# Patient Record
Sex: Male | Born: 1965 | Race: Black or African American | Hispanic: No | Marital: Married | Smoking: Light tobacco smoker
Health system: Southern US, Community
[De-identification: ages and names within clinical notes are randomized; demographics above are authoritative.]

## PROBLEM LIST (undated history)

## (undated) DIAGNOSIS — I255 Ischemic cardiomyopathy: Secondary | ICD-10-CM

## (undated) DIAGNOSIS — I10 Essential (primary) hypertension: Secondary | ICD-10-CM

## (undated) DIAGNOSIS — I236 Thrombosis of atrium, auricular appendage, and ventricle as current complications following acute myocardial infarction: Secondary | ICD-10-CM

## (undated) DIAGNOSIS — R918 Other nonspecific abnormal finding of lung field: Secondary | ICD-10-CM

## (undated) DIAGNOSIS — I214 Non-ST elevation (NSTEMI) myocardial infarction: Secondary | ICD-10-CM

## (undated) DIAGNOSIS — E785 Hyperlipidemia, unspecified: Secondary | ICD-10-CM

## (undated) HISTORY — PX: HERNIA REPAIR: SHX51

---

## 2007-01-01 ENCOUNTER — Emergency Department (HOSPITAL_COMMUNITY): Admission: EM | Admit: 2007-01-01 | Discharge: 2007-01-01 | Payer: Self-pay | Admitting: Emergency Medicine

## 2014-03-26 ENCOUNTER — Encounter (HOSPITAL_COMMUNITY): Payer: Self-pay | Admitting: Emergency Medicine

## 2014-03-26 ENCOUNTER — Emergency Department (HOSPITAL_COMMUNITY): Payer: Medicaid Other

## 2014-03-26 ENCOUNTER — Inpatient Hospital Stay (HOSPITAL_COMMUNITY)
Admission: EM | Admit: 2014-03-26 | Discharge: 2014-04-02 | DRG: 247 | Disposition: A | Discharge status: Home / Self Care | Payer: Medicaid Other | Attending: Interventional Cardiology | Admitting: Interventional Cardiology

## 2014-03-26 DIAGNOSIS — R918 Other nonspecific abnormal finding of lung field: Secondary | ICD-10-CM | POA: Diagnosis present

## 2014-03-26 DIAGNOSIS — Z720 Tobacco use: Secondary | ICD-10-CM | POA: Diagnosis present

## 2014-03-26 DIAGNOSIS — I2109 ST elevation (STEMI) myocardial infarction involving other coronary artery of anterior wall: Secondary | ICD-10-CM | POA: Diagnosis present

## 2014-03-26 DIAGNOSIS — I2584 Coronary atherosclerosis due to calcified coronary lesion: Secondary | ICD-10-CM | POA: Diagnosis present

## 2014-03-26 DIAGNOSIS — R079 Chest pain, unspecified: Secondary | ICD-10-CM

## 2014-03-26 DIAGNOSIS — I2510 Atherosclerotic heart disease of native coronary artery without angina pectoris: Secondary | ICD-10-CM | POA: Diagnosis present

## 2014-03-26 DIAGNOSIS — I472 Ventricular tachycardia: Secondary | ICD-10-CM | POA: Diagnosis present

## 2014-03-26 DIAGNOSIS — I10 Essential (primary) hypertension: Secondary | ICD-10-CM | POA: Diagnosis present

## 2014-03-26 DIAGNOSIS — E785 Hyperlipidemia, unspecified: Secondary | ICD-10-CM | POA: Diagnosis present

## 2014-03-26 DIAGNOSIS — I2102 ST elevation (STEMI) myocardial infarction involving left anterior descending coronary artery: Principal | ICD-10-CM | POA: Diagnosis present

## 2014-03-26 DIAGNOSIS — R9389 Abnormal findings on diagnostic imaging of other specified body structures: Secondary | ICD-10-CM

## 2014-03-26 DIAGNOSIS — F17210 Nicotine dependence, cigarettes, uncomplicated: Secondary | ICD-10-CM | POA: Diagnosis present

## 2014-03-26 DIAGNOSIS — I255 Ischemic cardiomyopathy: Secondary | ICD-10-CM | POA: Diagnosis present

## 2014-03-26 DIAGNOSIS — I513 Intracardiac thrombosis, not elsewhere classified: Secondary | ICD-10-CM | POA: Diagnosis not present

## 2014-03-26 DIAGNOSIS — I214 Non-ST elevation (NSTEMI) myocardial infarction: Secondary | ICD-10-CM

## 2014-03-26 HISTORY — DX: Essential (primary) hypertension: I10

## 2014-03-26 HISTORY — DX: Thrombosis of atrium, auricular appendage, and ventricle as current complications following acute myocardial infarction: I23.6

## 2014-03-26 HISTORY — DX: Non-ST elevation (NSTEMI) myocardial infarction: I21.4

## 2014-03-26 HISTORY — DX: Other nonspecific abnormal finding of lung field: R91.8

## 2014-03-26 HISTORY — DX: Ischemic cardiomyopathy: I25.5

## 2014-03-26 HISTORY — DX: Hyperlipidemia, unspecified: E78.5

## 2014-03-26 LAB — BASIC METABOLIC PANEL
ANION GAP: 9 (ref 5–15)
BUN: 11 mg/dL (ref 6–23)
CALCIUM: 9.5 mg/dL (ref 8.4–10.5)
CO2: 26 mmol/L (ref 19–32)
Chloride: 102 mmol/L (ref 96–112)
Creatinine, Ser: 0.98 mg/dL (ref 0.50–1.35)
GFR calc Af Amer: >90 mL/min (ref >90)
Glucose, Bld: 83 mg/dL (ref 70–99)
POTASSIUM: 3.9 mmol/L (ref 3.5–5.1)
SODIUM: 137 mmol/L (ref 135–145)

## 2014-03-26 LAB — CBC
HEMATOCRIT: 41.1 % (ref 39.0–52.0)
Hemoglobin: 14.1 g/dL (ref 13.0–17.0)
MCH: 31.3 pg (ref 26.0–34.0)
MCHC: 34.3 g/dL (ref 30.0–36.0)
MCV: 91.3 fL (ref 78.0–100.0)
Platelets: 186 10*3/uL (ref 150–400)
RBC: 4.5 MIL/uL (ref 4.22–5.81)
RDW: 14.2 % (ref 11.5–15.5)
WBC: 5.5 10*3/uL (ref 4.0–10.5)

## 2014-03-26 LAB — TROPONIN I: Troponin I: 0.16 ng/mL — ABNORMAL HIGH (ref <0.031)

## 2014-03-26 LAB — I-STAT TROPONIN, ED: Troponin i, poc: 0.1 ng/mL (ref 0.00–0.08)

## 2014-03-26 LAB — BRAIN NATRIURETIC PEPTIDE: B NATRIURETIC PEPTIDE 5: 48.4 pg/mL (ref 0.0–100.0)

## 2014-03-26 MED ORDER — NITROGLYCERIN 2 % TD OINT
0.5000 [in_us] | TOPICAL_OINTMENT | Freq: Four times a day (QID) | TRANSDERMAL | Status: DC
  Administered 2014-03-27 (×2): 0.5 [in_us] via TOPICAL
  Filled 2014-03-26 (×2): qty 1

## 2014-03-26 MED ORDER — ONDANSETRON HCL 4 MG/2ML IJ SOLN: 4.0000 mg | Freq: Four times a day (QID) | INTRAMUSCULAR | Status: DC | PRN

## 2014-03-26 MED ORDER — PANTOPRAZOLE SODIUM 40 MG PO TBEC
40.0000 mg | DELAYED_RELEASE_TABLET | Freq: Every day | ORAL | Status: DC
  Administered 2014-03-27 – 2014-04-02 (×7): 40 mg via ORAL
  Filled 2014-03-26 (×7): qty 1

## 2014-03-26 MED ORDER — ACETAMINOPHEN 325 MG PO TABS: 650.0000 mg | ORAL_TABLET | ORAL | Status: DC | PRN

## 2014-03-26 MED ORDER — DIPHENHYDRAMINE HCL 50 MG/ML IJ SOLN
25.0000 mg | Freq: Once | INTRAMUSCULAR | Status: AC
  Administered 2014-03-26: 25 mg via INTRAVENOUS
  Filled 2014-03-26: qty 1

## 2014-03-26 MED ORDER — NITROGLYCERIN 0.4 MG SL SUBL: 0.4000 mg | SUBLINGUAL_TABLET | SUBLINGUAL | Status: DC | PRN

## 2014-03-26 MED ORDER — PANTOPRAZOLE SODIUM 40 MG IV SOLR
40.0000 mg | Freq: Once | INTRAVENOUS | Status: AC
  Administered 2014-03-26: 40 mg via INTRAVENOUS
  Filled 2014-03-26: qty 40

## 2014-03-26 MED ORDER — MORPHINE SULFATE 4 MG/ML IJ SOLN
4.0000 mg | Freq: Once | INTRAMUSCULAR | Status: AC
  Administered 2014-03-26: 4 mg via INTRAVENOUS
  Filled 2014-03-26: qty 1

## 2014-03-26 MED ORDER — GI COCKTAIL ~~LOC~~: 30.0000 mL | Freq: Four times a day (QID) | ORAL | Status: DC | PRN

## 2014-03-26 MED ORDER — HEPARIN SODIUM (PORCINE) 5000 UNIT/ML IJ SOLN
5000.0000 [IU] | Freq: Three times a day (TID) | INTRAMUSCULAR | Status: DC
  Administered 2014-03-27: 5000 [IU] via SUBCUTANEOUS
  Filled 2014-03-26: qty 1

## 2014-03-26 MED ORDER — ONDANSETRON HCL 4 MG/2ML IJ SOLN
4.0000 mg | Freq: Once | INTRAMUSCULAR | Status: AC
  Administered 2014-03-26: 4 mg via INTRAVENOUS
  Filled 2014-03-26: qty 2

## 2014-03-26 NOTE — ED Provider Notes (Signed)
CSN: [id number]     Arrival date & time 03/26/14  1939 History   First MD Initiated Contact with Patient 03/26/14 2007     Chief Complaint  Patient presents with  . Chest Pain     (Consider location/radiation/quality/duration/timing/severity/associated sxs/prior Treatment) HPI Comments: Patient is a 49 year old male with a past medical history of hypertension and tobacco use who presents after 2 episodes of chest pain that occurred prior to arrival. 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 The pain is described as "burning" and severe. Patient reports being "unable to catch his breath" at that time. He reports diaphoresis as well. The symptoms lasted about 10 minutes and spontaneously resolved. Patient reports coming home shortly after the first episode and had another similar episode when he was eating dinner. This episode lasted 30 minutes. His wife called EMS. This time, his symptoms resolved after sublingual nitro and ASA given by EMS. No aggravating/alleviating factors. No other associated symptoms. Patient denies family history of early heart disease. He does not take medication for his hypertension.    Past Medical History  Diagnosis Date  . Hypertension    Past Surgical History  Procedure Laterality Date  . Hernia repair     No family history on file. History  Substance Use Topics  . Smoking status: Current Every Day Smoker -- 0.50 packs/day    Types: Cigarettes  . Smokeless tobacco: Not on file  . Alcohol Use: Yes    Review of Systems  Constitutional: Negative for fever, chills and fatigue.  HENT: Negative for trouble swallowing.   Eyes: Negative for visual disturbance.  Respiratory: Positive for shortness of breath.   Cardiovascular: Positive for chest pain. Negative for palpitations.  Gastrointestinal: Negative for nausea, vomiting, abdominal pain and diarrhea.  Genitourinary:  Negative for dysuria and difficulty urinating.  Musculoskeletal: Negative for arthralgias and neck pain.  Skin: Negative for color change.  Neurological: Negative for dizziness and weakness.  Psychiatric/Behavioral: Negative for dysphoric mood.      Allergies  Review of patient's allergies indicates no known allergies.  Home Medications   Prior to Admission medications   Medication Sig Start Date End Date Taking? Authorizing Provider  Multiple Vitamin (MULTIVITAMIN WITH MINERALS) TABS tablet Take 1 tablet by mouth daily.   Yes Historical Provider, MD   BP 127/77 mmHg  Pulse 87  Temp(Src) 97.8 F (36.6 C) (Oral)  Resp 12  Ht 6' (1.829 m)  Wt 215 lb (97.523 kg)  BMI 29.15 kg/m2  SpO2 97% Physical Exam  Constitutional: He is oriented to person, place, and time. He appears well-developed and well-nourished. No distress.  HENT:  Head: Normocephalic and atraumatic.  Eyes: Conjunctivae and EOM are normal.  Neck: Normal range of motion.  Cardiovascular: Normal rate and regular rhythm.  Exam reveals no gallop and no friction rub.   No murmur heard. Pulmonary/Chest: Effort normal and breath sounds normal. He has no wheezes. He has no rales. He exhibits no tenderness.  Abdominal: Soft. He exhibits no distension. There is no tenderness. There is no rebound.  Musculoskeletal: Normal range of motion.  Neurological: He is alert and oriented to person, place, and time. Coordination normal.  Speech is goal-oriented. Moves limbs without ataxia.   Skin: Skin is warm and dry.  Psychiatric: He has a normal mood and affect. His behavior is normal.  Nursing note and vitals reviewed.   ED Course  Procedures (including critical care time) Labs Review Labs Reviewed  TROPONIN I - Abnormal; Notable for the following:    Troponin I 0.16 (*)    All other components within normal limits  I-STAT TROPOININ, ED - Abnormal; Notable for the following:    Troponin i, poc 0.10 (*)    All other  components within normal limits  CBC  BASIC METABOLIC PANEL  BRAIN NATRIURETIC PEPTIDE  TROPONIN I  TROPONIN I  TROPONIN I    Imaging Review Dg Chest 2 View  03/26/2014   CLINICAL DATA:  One day history of chest pain and hypertension  EXAM: CHEST  2 VIEW  COMPARISON:  None.  FINDINGS: There is bullous disease in the right apex. There is no edema or consolidation. On the lateral view, there is a 9 x 7 mm nodular opacity overlying the heart, not confirmed on frontal view.  Heart is upper normal in size with pulmonary vascularity within normal limits. No adenopathy. No bone lesions.  IMPRESSION: 9 x 7 mm nodular opacity on the lateral view overlying the heart. This finding cannot be confirmed on the frontal view. Given this finding, and noncontrast enhanced chest CT is advised to further assess.  Bullous disease right apex.  No edema or consolidation.   Electronically Signed   By: Bretta BangWilliam  Woodruff III M.D.   On: 03/26/2014 21:48     EKG Interpretation   Date/Time:  Wednesday March 26 2014 19:49:15 EDT Ventricular Rate:  83 PR Interval:  151 QRS Duration: 92 QT Interval:  369 QTC Calculation: 434 R Axis:   83 Text Interpretation:  Sinus rhythm Left atrial enlargement Anterior  infarct, old No old tracing to compare Confirmed by Jackson NorthWENTZ  MD, ELLIOTT  [phone]) on 03/26/2014 8:37:57 PM      MDM   Final diagnoses:  Chest pain   8:32 PM Labs and chest xray pending. Vitals stable and patient afebrile.   Patient's troponin mildly elevated. Patient will be admitted for ACS rule out.   Emilia BeckKaitlyn Haniah Penny, PA-C 03/26/14 2350  Mancel BaleElliott Wentz, MD 03/27/14 [phone]

## 2014-03-26 NOTE — H&P (Signed)
[facility] History and Physical  Thomas SolGuy Reigle ZOX:[id number] DOB: Nov 21, 1965 DOA: 03/26/2014  Referring physician: EDP PCP: No primary care provider on file.   Chief Complaint: Chest pain   HPI: Thomas Woods is a 49 y.o. male with h/o HTN and smoking, patient presents to the ED with chest pain described as a burning sensation in the middle of his chest.  There is radiation to his left jaw.  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  There is associated SOB.  Initially symptoms got better with rest but got worse again around dinner time.  Were 7/10 at time he called EMS.  NTG SL given and pain reduced to 2/10, pain is now slowly increasing as SL NTG wears off.  Review of Systems: Systems reviewed.  As above, otherwise negative  Past Medical History  Diagnosis Date  . Hypertension    Past Surgical History  Procedure Laterality Date  . Hernia repair     Social History:  reports that he has been smoking Cigarettes.  He has been smoking about 0.50 packs per day. He does not have any smokeless tobacco history on file. He reports that he drinks alcohol. He reports that he does not use illicit drugs.  No Known Allergies  No family history on file.   Prior to Admission medications   Medication Sig Start Date End Date Taking? Authorizing Provider  Multiple Vitamin (MULTIVITAMIN WITH MINERALS) TABS tablet Take 1 tablet by mouth daily.   Yes Historical Provider, MD   Physical Exam: Filed Vitals:   03/26/14 2115  BP: 125/96  Pulse: 81  Temp:   Resp: 16    BP 125/96 mmHg  Pulse 81  Temp(Src) 97.8 F (36.6 C) (Oral)  Resp 16  Ht 6' (1.829 m)  Wt 97.523 kg (215 lb)  BMI 29.15 kg/m2  SpO2 97%  General Appearance:    Alert, oriented, no distress, appears stated age  Head:    Normocephalic, atraumatic  Eyes:    PERRL, EOMI, sclera non-icteric        Nose:   Nares without drainage or epistaxis. Mucosa, turbinates normal  Throat:   Moist mucous  membranes. Oropharynx without erythema or exudate.  Neck:   Supple. No carotid bruits.  No thyromegaly.  No lymphadenopathy.   Back:     No CVA tenderness, no spinal tenderness  Lungs:     Clear to auscultation bilaterally, without wheezes, rhonchi or rales  Chest wall:    No tenderness to palpitation  Heart:    Regular rate and rhythm without murmurs, gallops, rubs  Abdomen:     Soft, non-tender, nondistended, normal bowel sounds, no organomegaly  Genitalia:    deferred  Rectal:    deferred  Extremities:   No clubbing, cyanosis or edema.  Pulses:   2+ and symmetric all extremities  Skin:   Skin color, texture, turgor normal, no rashes or lesions  Lymph nodes:   Cervical, supraclavicular, and axillary nodes normal  Neurologic:   CNII-XII intact. Normal strength, sensation and reflexes      throughout    Labs on Admission:  Basic Metabolic Panel:  Recent Labs Lab 03/26/14 2018  NA 137  K 3.9  CL 102  CO2 26  GLUCOSE 83  BUN 11  CREATININE 0.98  CALCIUM 9.5   Liver Function Tests: No results for input(s): AST, ALT, ALKPHOS, BILITOT, PROT, ALBUMIN in the last 168 hours. No results for input(s): LIPASE, AMYLASE in the last 168 hours.  No results for input(s): AMMONIA in the last 168 hours. CBC:  Recent Labs Lab 03/26/14 2018  WBC 5.5  HGB 14.1  HCT 41.1  MCV 91.3  PLT 186   Cardiac Enzymes:  Recent Labs Lab 03/26/14 2043  TROPONINI 0.16*    BNP (last 3 results) No results for input(s): PROBNP in the last 8760 hours. CBG: No results for input(s): GLUCAP in the last 168 hours.  Radiological Exams on Admission: Dg Chest 2 View  03/26/2014   CLINICAL DATA:  One day history of chest pain and hypertension  EXAM: CHEST  2 VIEW  COMPARISON:  None.  FINDINGS: There is bullous disease in the right apex. There is no edema or consolidation. On the lateral view, there is a 9 x 7 mm nodular opacity overlying the heart, not confirmed on frontal view.  Heart is upper normal  in size with pulmonary vascularity within normal limits. No adenopathy. No bone lesions.  IMPRESSION: 9 x 7 mm nodular opacity on the lateral view overlying the heart. This finding cannot be confirmed on the frontal view. Given this finding, and noncontrast enhanced chest CT is advised to further assess.  Bullous disease right apex.  No edema or consolidation.   Electronically Signed   By: Bretta Bang III M.D.   On: 03/26/2014 21:48    EKG: Independently reviewed.  Assessment/Plan Active Problems:   Chest pain   Chest pain with moderate risk for cardiac etiology   1. Chest pain with moderate risk for cardiac etiology - HEART score is 6 with positive troponin initially at 0.13. 1. Chest pain obs 2. NTG paste for chest pain since SL NTG worked with EMS 3. Will also try empiric PPI and GI cocktail but am suspicious given the onset of this that this may not be GERD 4. Serial trops ordered 5. If troponin elevates any further order heparin GTT and inform cards 6. Call cards in AM regarding stress test. 7. NPO after midnight.    Code Status: Full Code  Family Communication: Wife at bedside Disposition Plan: Admit to obs   Time spent: 70 min  Galan Ghee M. [facility] Pager [phone]  If 7AM-7PM, please contact the day team taking care of the patient [email or web address] Password [facility] 03/26/2014, 10:44 PM

## 2014-03-26 NOTE — ED Notes (Signed)
Pt ambulated to bathroom with steady gait. 

## 2014-03-26 NOTE — ED Notes (Signed)
Pt arrives with c/o chest pain, substernal, started while working. 324 MG aspirin and 2 SL nitro on board, took pain from 7/10 to 2/10. Pt moving furniture, noticed SHOB and pain, went away, pain returned after eating. SHOB, gassy but no other s/s.

## 2014-03-27 ENCOUNTER — Encounter (HOSPITAL_COMMUNITY)
Admission: EM | Disposition: A | Discharge status: Home / Self Care | Payer: Self-pay | Source: Home / Self Care | Attending: Interventional Cardiology

## 2014-03-27 ENCOUNTER — Other Ambulatory Visit (HOSPITAL_COMMUNITY): Payer: Self-pay

## 2014-03-27 ENCOUNTER — Encounter (HOSPITAL_COMMUNITY): Payer: Self-pay

## 2014-03-27 DIAGNOSIS — I255 Ischemic cardiomyopathy: Secondary | ICD-10-CM | POA: Diagnosis present

## 2014-03-27 DIAGNOSIS — E785 Hyperlipidemia, unspecified: Secondary | ICD-10-CM | POA: Diagnosis present

## 2014-03-27 DIAGNOSIS — Z720 Tobacco use: Secondary | ICD-10-CM | POA: Diagnosis present

## 2014-03-27 DIAGNOSIS — I2102 ST elevation (STEMI) myocardial infarction involving left anterior descending coronary artery: Secondary | ICD-10-CM | POA: Diagnosis present

## 2014-03-27 DIAGNOSIS — I2510 Atherosclerotic heart disease of native coronary artery without angina pectoris: Secondary | ICD-10-CM | POA: Diagnosis present

## 2014-03-27 DIAGNOSIS — I472 Ventricular tachycardia: Secondary | ICD-10-CM | POA: Diagnosis present

## 2014-03-27 DIAGNOSIS — I236 Thrombosis of atrium, auricular appendage, and ventricle as current complications following acute myocardial infarction: Secondary | ICD-10-CM

## 2014-03-27 DIAGNOSIS — F17210 Nicotine dependence, cigarettes, uncomplicated: Secondary | ICD-10-CM | POA: Diagnosis present

## 2014-03-27 DIAGNOSIS — I214 Non-ST elevation (NSTEMI) myocardial infarction: Secondary | ICD-10-CM

## 2014-03-27 DIAGNOSIS — I2584 Coronary atherosclerosis due to calcified coronary lesion: Secondary | ICD-10-CM | POA: Diagnosis present

## 2014-03-27 DIAGNOSIS — R079 Chest pain, unspecified: Secondary | ICD-10-CM | POA: Diagnosis present

## 2014-03-27 DIAGNOSIS — I2109 ST elevation (STEMI) myocardial infarction involving other coronary artery of anterior wall: Secondary | ICD-10-CM

## 2014-03-27 DIAGNOSIS — R918 Other nonspecific abnormal finding of lung field: Secondary | ICD-10-CM | POA: Diagnosis present

## 2014-03-27 DIAGNOSIS — I10 Essential (primary) hypertension: Secondary | ICD-10-CM | POA: Diagnosis present

## 2014-03-27 HISTORY — DX: Hyperlipidemia, unspecified: E78.5

## 2014-03-27 HISTORY — DX: Thrombosis of atrium, auricular appendage, and ventricle as current complications following acute myocardial infarction: I23.6

## 2014-03-27 HISTORY — DX: Ischemic cardiomyopathy: I25.5

## 2014-03-27 HISTORY — PX: PERCUTANEOUS CORONARY STENT INTERVENTION (PCI-S): SHX6016

## 2014-03-27 HISTORY — PX: LEFT HEART CATHETERIZATION WITH CORONARY ANGIOGRAM: SHX5451

## 2014-03-27 LAB — TSH: TSH: 0.769 u[IU]/mL (ref 0.350–4.500)

## 2014-03-27 LAB — CBC
HCT: 42.4 % (ref 39.0–52.0)
Hemoglobin: 14.8 g/dL (ref 13.0–17.0)
MCH: 31.6 pg (ref 26.0–34.0)
MCHC: 34.9 g/dL (ref 30.0–36.0)
MCV: 90.6 fL (ref 78.0–100.0)
Platelets: 190 10*3/uL (ref 150–400)
RBC: 4.68 MIL/uL (ref 4.22–5.81)
RDW: 14.1 % (ref 11.5–15.5)
WBC: 6 10*3/uL (ref 4.0–10.5)

## 2014-03-27 LAB — LIPID PANEL
Cholesterol: 210 mg/dL — ABNORMAL HIGH (ref 0–200)
HDL: 64 mg/dL (ref >39)
LDL Cholesterol: 134 mg/dL — ABNORMAL HIGH (ref 0–99)
TRIGLYCERIDES: 62 mg/dL (ref <150)
Total CHOL/HDL Ratio: 3.3 RATIO
VLDL: 12 mg/dL (ref 0–40)

## 2014-03-27 LAB — HEPARIN LEVEL (UNFRACTIONATED): HEPARIN UNFRACTIONATED: 0.45 [IU]/mL (ref 0.30–0.70)

## 2014-03-27 LAB — HEPATIC FUNCTION PANEL
ALT: 17 U/L (ref 0–53)
AST: 65 U/L — AB (ref 0–37)
Albumin: 3.9 g/dL (ref 3.5–5.2)
Alkaline Phosphatase: 39 U/L (ref 39–117)
Bilirubin, Direct: 0.2 mg/dL (ref 0.0–0.5)
Indirect Bilirubin: 1.1 mg/dL — ABNORMAL HIGH (ref 0.3–0.9)
Total Bilirubin: 1.3 mg/dL — ABNORMAL HIGH (ref 0.3–1.2)
Total Protein: 7.3 g/dL (ref 6.0–8.3)

## 2014-03-27 LAB — TROPONIN I
TROPONIN I: 0.33 ng/mL — AB (ref <0.031)
TROPONIN I: 1.41 ng/mL — AB (ref <0.031)
Troponin I: 0.93 ng/mL (ref <0.031)

## 2014-03-27 LAB — POCT ACTIVATED CLOTTING TIME
ACTIVATED CLOTTING TIME: 288 s
Activated Clotting Time: 319 seconds

## 2014-03-27 LAB — PLATELET COUNT: Platelets: 168 10*3/uL (ref 150–400)

## 2014-03-27 LAB — MRSA PCR SCREENING: MRSA by PCR: NEGATIVE

## 2014-03-27 LAB — PROTIME-INR
INR: 1.08 (ref 0.00–1.49)
Prothrombin Time: 14.1 seconds (ref 11.6–15.2)

## 2014-03-27 SURGERY — LEFT HEART CATHETERIZATION WITH CORONARY ANGIOGRAM
Anesthesia: LOCAL

## 2014-03-27 MED ORDER — NITROGLYCERIN IN D5W 200-5 MCG/ML-% IV SOLN
INTRAVENOUS | Status: AC
  Filled 2014-03-27: qty 250

## 2014-03-27 MED ORDER — ONDANSETRON HCL 4 MG/2ML IJ SOLN: 4.0000 mg | Freq: Three times a day (TID) | INTRAMUSCULAR | Status: DC | PRN

## 2014-03-27 MED ORDER — FENTANYL CITRATE 0.05 MG/ML IJ SOLN
INTRAMUSCULAR | Status: AC
  Filled 2014-03-27: qty 2

## 2014-03-27 MED ORDER — ASPIRIN EC 81 MG PO TBEC: 81.0000 mg | DELAYED_RELEASE_TABLET | Freq: Every day | ORAL | Status: DC

## 2014-03-27 MED ORDER — ATORVASTATIN CALCIUM 80 MG PO TABS
80.0000 mg | ORAL_TABLET | Freq: Every day | ORAL | Status: DC
  Administered 2014-03-27 – 2014-04-01 (×6): 80 mg via ORAL
  Filled 2014-03-27 (×7): qty 1

## 2014-03-27 MED ORDER — TIROFIBAN HCL IV 5 MG/100ML
INTRAVENOUS | Status: AC
  Filled 2014-03-27: qty 100

## 2014-03-27 MED ORDER — HEPARIN (PORCINE) IN NACL 2-0.9 UNIT/ML-% IJ SOLN
INTRAMUSCULAR | Status: AC
  Filled 2014-03-27: qty 1000

## 2014-03-27 MED ORDER — ONDANSETRON HCL 4 MG/2ML IJ SOLN: 4.0000 mg | Freq: Four times a day (QID) | INTRAMUSCULAR | Status: DC | PRN

## 2014-03-27 MED ORDER — ACETAMINOPHEN 325 MG PO TABS: 650.0000 mg | ORAL_TABLET | ORAL | Status: DC | PRN

## 2014-03-27 MED ORDER — PERFLUTREN LIPID MICROSPHERE
INTRAVENOUS | Status: AC
  Filled 2014-03-27: qty 10

## 2014-03-27 MED ORDER — HEPARIN (PORCINE) IN NACL 100-0.45 UNIT/ML-% IJ SOLN
1300.0000 [IU]/h | INTRAMUSCULAR | Status: DC
  Administered 2014-03-27: 1300 [IU]/h via INTRAVENOUS
  Filled 2014-03-27 (×2): qty 250

## 2014-03-27 MED ORDER — MORPHINE SULFATE 2 MG/ML IJ SOLN: 2.0000 mg | INTRAMUSCULAR | Status: DC | PRN

## 2014-03-27 MED ORDER — ADULT MULTIVITAMIN W/MINERALS CH
1.0000 | ORAL_TABLET | Freq: Every day | ORAL | Status: DC
  Administered 2014-03-27 – 2014-04-02 (×7): 1 via ORAL
  Filled 2014-03-27 (×7): qty 1

## 2014-03-27 MED ORDER — METOPROLOL TARTRATE 1 MG/ML IV SOLN
5.0000 mg | INTRAVENOUS | Status: DC | PRN
  Administered 2014-03-27: 5 mg via INTRAVENOUS

## 2014-03-27 MED ORDER — TIROFIBAN HCL IV 5 MG/100ML: 0.1500 ug/kg/min | INTRAVENOUS | Status: AC

## 2014-03-27 MED ORDER — VERAPAMIL HCL 2.5 MG/ML IV SOLN
INTRAVENOUS | Status: AC
Start: 2014-03-27 — End: 2014-03-27
  Filled 2014-03-27: qty 2

## 2014-03-27 MED ORDER — HYDROMORPHONE HCL 1 MG/ML IJ SOLN
1.0000 mg | INTRAMUSCULAR | Status: AC | PRN
  Administered 2014-03-27: 1 mg via INTRAVENOUS
  Filled 2014-03-27: qty 1

## 2014-03-27 MED ORDER — TICAGRELOR 90 MG PO TABS
90.0000 mg | ORAL_TABLET | Freq: Two times a day (BID) | ORAL | Status: DC
  Administered 2014-03-27 – 2014-04-02 (×12): 90 mg via ORAL
  Filled 2014-03-27 (×13): qty 1

## 2014-03-27 MED ORDER — HEPARIN BOLUS VIA INFUSION
4000.0000 [IU] | Freq: Once | INTRAVENOUS | Status: AC
  Administered 2014-03-27: 4000 [IU] via INTRAVENOUS
  Filled 2014-03-27: qty 4000

## 2014-03-27 MED ORDER — METOPROLOL TARTRATE 12.5 MG HALF TABLET
12.5000 mg | ORAL_TABLET | Freq: Two times a day (BID) | ORAL | Status: DC
  Filled 2014-03-27: qty 1

## 2014-03-27 MED ORDER — NITROGLYCERIN IN D5W 200-5 MCG/ML-% IV SOLN
2.0000 ug/min | INTRAVENOUS | Status: DC
  Administered 2014-03-27: 10 ug/min via INTRAVENOUS

## 2014-03-27 MED ORDER — ALPRAZOLAM 0.25 MG PO TABS
0.2500 mg | ORAL_TABLET | Freq: Two times a day (BID) | ORAL | Status: DC | PRN
  Administered 2014-03-27: 0.25 mg via ORAL
  Filled 2014-03-27: qty 1

## 2014-03-27 MED ORDER — TICAGRELOR 90 MG PO TABS
ORAL_TABLET | ORAL | Status: AC
  Filled 2014-03-27: qty 2

## 2014-03-27 MED ORDER — MIDAZOLAM HCL 2 MG/2ML IJ SOLN
INTRAMUSCULAR | Status: AC
  Filled 2014-03-27: qty 2

## 2014-03-27 MED ORDER — ANGIOPLASTY BOOK
Freq: Once | Status: DC
  Filled 2014-03-27: qty 1

## 2014-03-27 MED ORDER — HEPARIN SODIUM (PORCINE) 1000 UNIT/ML IJ SOLN
INTRAMUSCULAR | Status: AC
  Filled 2014-03-27: qty 1

## 2014-03-27 MED ORDER — NITROGLYCERIN 0.4 MG SL SUBL: 0.4000 mg | SUBLINGUAL_TABLET | SUBLINGUAL | Status: DC | PRN

## 2014-03-27 MED ORDER — SODIUM CHLORIDE 0.9 % IJ SOLN: 3.0000 mL | INTRAMUSCULAR | Status: DC | PRN

## 2014-03-27 MED ORDER — SODIUM CHLORIDE 0.9 % IJ SOLN
3.0000 mL | Freq: Two times a day (BID) | INTRAMUSCULAR | Status: DC
  Administered 2014-03-27 – 2014-03-31 (×4): 3 mL via INTRAVENOUS

## 2014-03-27 MED ORDER — NICOTINE 14 MG/24HR TD PT24
14.0000 mg | MEDICATED_PATCH | Freq: Every day | TRANSDERMAL | Status: DC
Start: 2014-03-27 — End: 2014-04-02
  Administered 2014-03-28 – 2014-04-02 (×4): 14 mg via TRANSDERMAL
  Filled 2014-03-27 (×7): qty 1

## 2014-03-27 MED ORDER — PERFLUTREN LIPID MICROSPHERE
1.0000 mL | INTRAVENOUS | Status: AC | PRN
  Administered 2014-03-27: 5 mL via INTRAVENOUS
  Filled 2014-03-27: qty 10

## 2014-03-27 MED ORDER — METOPROLOL TARTRATE 12.5 MG HALF TABLET
12.5000 mg | ORAL_TABLET | Freq: Two times a day (BID) | ORAL | Status: DC
  Administered 2014-03-27 – 2014-03-30 (×8): 12.5 mg via ORAL
  Filled 2014-03-27 (×8): qty 1

## 2014-03-27 MED ORDER — SODIUM CHLORIDE 0.9 % IV SOLN: 250.0000 mL | INTRAVENOUS | Status: DC | PRN

## 2014-03-27 MED ORDER — METOPROLOL TARTRATE 1 MG/ML IV SOLN
INTRAVENOUS | Status: AC
  Filled 2014-03-27: qty 5

## 2014-03-27 MED ORDER — LIDOCAINE HCL (PF) 1 % IJ SOLN
INTRAMUSCULAR | Status: AC
  Filled 2014-03-27: qty 30

## 2014-03-27 MED ORDER — ASPIRIN 81 MG PO CHEW
81.0000 mg | CHEWABLE_TABLET | Freq: Every day | ORAL | Status: DC
  Administered 2014-03-28 – 2014-04-02 (×6): 81 mg via ORAL
  Filled 2014-03-27 (×6): qty 1

## 2014-03-27 MED ORDER — ZOLPIDEM TARTRATE 5 MG PO TABS
5.0000 mg | ORAL_TABLET | Freq: Every evening | ORAL | Status: DC | PRN
  Administered 2014-03-27: 5 mg via ORAL
  Filled 2014-03-27: qty 1

## 2014-03-27 MED ORDER — HEART ATTACK BOUNCING BOOK
Freq: Once | Status: AC
  Administered 2014-03-28: 1
  Filled 2014-03-27: qty 1

## 2014-03-27 MED ORDER — SODIUM CHLORIDE 0.9 % IV SOLN
1.0000 mL/kg/h | INTRAVENOUS | Status: AC
  Administered 2014-03-27: 1 mL/kg/h via INTRAVENOUS

## 2014-03-27 NOTE — Progress Notes (Signed)
Chaplain responded to code stemi. ED nurse informed chaplain that pt had one family member present; however chaplain unable to locate family member upon checking waiting area outside cath lab and 2N surgical check-in desk. Still unsure of family whereabouts. Page if needed.  Wille GlaserMcCray, Antino Mayabb O, Chaplain 03/27/2014 9:32 AM

## 2014-03-27 NOTE — ED Notes (Signed)
RN spoke with Pamelia Hoitonda from cards; she is on her way shortly.

## 2014-03-27 NOTE — Progress Notes (Signed)
ANTICOAGULATION CONSULT NOTE - Initial Consult  Pharmacy Consult for heparin Indication: chest pain/ACS  No Known Allergies  Patient Measurements: Height: 6' (182.9 cm) Weight: 215 lb (97.523 kg) IBW/kg (Calculated) : 77.6  Vital Signs: Temp: 97.8 F (36.6 C) (03/16 1954) Temp Source: Oral (03/16 1954) BP: 135/93 mmHg (03/17 0006) Pulse Rate: 72 (03/17 0006)  Labs:  Recent Labs  03/26/14 2018 03/26/14 2043 03/26/14 2334  HGB 14.1  --   --   HCT 41.1  --   --   PLT 186  --   --   CREATININE 0.98  --   --   TROPONINI  --  0.16* 0.33*    Estimated Creatinine Clearance: 112.8 mL/min (by C-G formula based on Cr of 0.98).   Medical History: Past Medical History  Diagnosis Date  . Hypertension      Assessment: 49yo male c/o substernal CP and SOB, troponin elevated and trending up, to begin heparin.  Goal of Therapy:  Heparin level 0.3-0.7 units/ml Monitor platelets by anticoagulation protocol: Yes   Plan:  Will give heparin 4000 units IV bolus x1 followed by gtt at 1300 units/hr and monitor heparin levels and CBC.  Vernard GamblesVeronda Tyreisha Ungar, PharmD, BCPS  03/27/2014,12:51 AM

## 2014-03-27 NOTE — Progress Notes (Signed)
Cardiology assumed primary care. Will sign off. Please call if needed.   Kathlen ModyVijaya Jeson Camacho, MD [phone]

## 2014-03-27 NOTE — Progress Notes (Signed)
Aggrastat infusing at 17.6cc/hr. 12 lead post EKG done.

## 2014-03-27 NOTE — CV Procedure (Addendum)
PROCEDURE:  Left heart catheterization with selective coronary angiography, left ventriculogram.  Aspiration thrombectomy of the LAD. PCI of LAD.   INDICATIONS:  Acute anterior wall MI  The risks, benefits, and details of the procedure were explained to the patient.  The patient verbalized understanding and wanted to proceed.  Emergency consent was obtained.  The patient had anterior ST  changes on his ECG and refractory chest discomfort  PROCEDURE TECHNIQUE:  After Xylocaine anesthesia a 70F slender sheath was placed in the right radial artery with a single anterior needle wall stick.   IV Heparin was given.  Right coronary angiography was done using a Judkins R4 guide catheter.  Left coronary angiography was done using a Judkins L3.5 guide catheter.  Left ventriculography was done using a pigtail catheter.  A TR band was used for hemostasis.   CONTRAST:  Total of 150 cc.  COMPLICATIONS:  None.    HEMODYNAMICS:  Aortic pressure was 110/77; LV pressure was 111/5; LVEDP 16.  There was no gradient between the left ventricle and aorta.    ANGIOGRAPHIC DATA:   The left main coronary artery is widely patent.  The left anterior descending artery is occluded in the mid vessel. There is calcification noted under fluoroscopy. After flow was restored, it was noted that there is a long segment of disease in the LAD. There were several small to medium size diagonal vessels which appeared patent.  The left circumflex artery is a large vessel proximally. There is a ramus vessel which is patent there is moderate disease in the mid vessel of his ramus. The first obtuse marginal is a large vessel. There is mild proximal disease. The vessel ranges across the lateral wall and is widely patent.  The remainder of the circumflex is small but patent.  The right coronary artery is a large, dominant vessel. There is moderate disease in the mid vessel. The posterior lateral artery is large. The posterior  descending artery is large and widely patent.  PCI NARRATIVE: A CLS 3.0 guiding catheter was used to engage left main. A pro-water wire was placed across the occlusion in the LAD. A fetch catheter was used for aspiration thrombectomy. This restored TIMI 3 flow. A 2.5 x 38 Synergy drug-eluting stent was deployed at high pressure in the more distal area of stenosis. A 3.0 x 32 Synergy drug-eluting stent was then deployed in overlapping fashion to cover the more proximal area of disease. The stent balloon was used to post dilate the stented segment. This balloon did not go all the way down to the distal portion of the stented segment. The proximal portion of stent was postdilated with a 3.5 x 20 noncompliant balloon. Subsequent, a 3.0 noncompliant balloon was used to post dilate the more distal area of the stented segment. There was an excellent angiographic result. There was no residual stenosis. Intracoronary nitroglycerin was given. The patient also had some spasm in his radial artery and intracoronary nitroglycerin was given and the sheath.  LEFT VENTRICULOGRAM:  Left ventricular angiogram was done in the 30 RAO projection and revealed normal moderately decreased left ventricular  systolic function with an estimated ejection fraction of 30 %.   There is severe apical hypokinesis. LVEDP was 16 mmHg.  IMPRESSIONS:  1. Normal left main coronary artery. 2. 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 This was successfully treated with aspiration thrombectomy which restored TIMI 3 flow. This was followed by overlapping  drug-eluting stent placement with a 2.5 x 38 Synergy drug-eluting stent and a 3.0 x 32 drug-eluting stent to cover a long area of disease. 3. Mild to moderate disease in the left circumflex artery and its branches. 4. Moderate disease in the mid right coronary artery. 5. Moderately decreased left ventricular systolic function.  LVEDP 16 mmHg.   Ejection fraction 30%.  RECOMMENDATION:  Continue aggressive secondary prevention including smoking cessation. He'll need dual antiplatelet therapy for at least a year and likely longer given the other moderate disease that he has. He will be watched in the ICU. I expect he will be in the hospital for at least a day or 2. Hopefully, he will have some recovery of his left ventricular function now that he has been revascularized.

## 2014-03-27 NOTE — ED Notes (Signed)
Cards at bedside

## 2014-03-27 NOTE — ED Notes (Signed)
Pt placed on hospital bed at this time.  

## 2014-03-27 NOTE — Progress Notes (Addendum)
   Called to see patient in the ER with ongoing chest pain 4/10 and NSVT.  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  Exam is unremarkable.  There is a nodular density on CXR.  Markers are positive and ECG shows evolutionary changes of anterior MI.  IMPRESSION: ACS with ongoing pain. ECG c/w LAD distribution event.Possibly occluded vessel with collaterals.  Plan: Immediate/urgent cath and intervention if indicated.

## 2014-03-27 NOTE — Progress Notes (Signed)
Patient Name: Thomas Woods Date of Encounter: 03/27/2014  Principal Problem:   NSTEMI, initial episode of care Active Problems:   Tobacco abuse   Primary Cardiologist: New, will be Dr. Katrinka Blazing  Patient Profile: 49 yo male w/ hx HTN, tob use, no CAD, was admitted 03/16 w/ NSTEMI.   SUBJECTIVE Chest pain had initially resolved, but is having again, burning, 4/10  OBJECTIVE Filed Vitals:   03/27/14 0601 03/27/14 0700 03/27/14 0800 03/27/14 0847  BP: 133/84 136/90 138/85 140/90  Pulse: 76 74 67 73  Temp:      TempSrc:      Resp: Height:      Weight:      SpO2: 94% 97% 94% 96%   No intake or output data in the 24 hours ending 03/27/14 0857 Filed Weights   03/26/14 1954  Weight: 215 lb (97.523 kg)    PHYSICAL EXAM General: Well developed, well nourished, male in no acute distress. Head: Normocephalic, atraumatic.  Neck: Supple without bruits, JVD not elevated Lungs:  Resp regular and unlabored, CTA. Heart: RRR, S1, S2, no S3, S4, or murmur; no rub. Abdomen: Soft, non-tender, non-distended, BS + x 4.  Extremities: No clubbing, cyanosis, edema.  Neuro: Alert and oriented X 3. Moves all extremities spontaneously. Psych: Normal affect.  LABS: CBC:  Recent Labs  03/26/14 2018  WBC 5.5  HGB 14.1  HCT 41.1  MCV 91.3  PLT 186   Basic Metabolic Panel:  Recent Labs  16/10/96 2018  NA 137  K 3.9  CL 102  CO2 26  GLUCOSE 83  BUN 11  CREATININE 0.98  CALCIUM 9.5   Cardiac Enzymes:  Recent Labs  03/26/14 2334 03/27/14 0218 03/27/14 0413  TROPONINI 0.33* 0.93* 1.41*    Recent Labs  03/26/14 2025  TROPIPOC 0.10*   BNP:  B NATRIURETIC PEPTIDE  Date/Time Value Ref Range Status  03/26/2014 08:18 PM 48.4 0.0 - 100.0 pg/mL Final    TELE:  SR, NSVT      ECG: ?ST elevation V 3-4  Radiology/Studies: Dg Chest 2 View 03/26/2014   CLINICAL DATA:  One day history of chest pain and hypertension  EXAM: CHEST  2 VIEW  COMPARISON:  None.   FINDINGS: There is bullous disease in the right apex. There is no edema or consolidation. On the lateral view, there is a 9 x 7 mm nodular opacity overlying the heart, not confirmed on frontal view.  Heart is upper normal in size with pulmonary vascularity within normal limits. No adenopathy. No bone lesions.  IMPRESSION: 9 x 7 mm nodular opacity on the lateral view overlying the heart. This finding cannot be confirmed on the frontal view. Given this finding, and noncontrast enhanced chest CT is advised to further assess.  Bullous disease right apex.  No edema or consolidation.   Electronically Signed   By: Bretta Bang III M.D.   On: 03/26/2014 21:48     Current Medications:  . metoprolol      . nicotine  14 mg Transdermal Daily  . nitroGLYCERIN      . pantoprazole  40 mg Oral Daily   . heparin 1,300 Units/hr (03/27/14 0116)  . nitroGLYCERIN 10 mcg/min (03/27/14 0851)    ASSESSMENT AND PLAN: Principal Problem:   NSTEMI, initial episode of care MD to see, needs cath. The risks and benefits of a cardiac catheterization including, but not limited to, death, stroke, MI, kidney damage and bleeding were discussed  with the patient who indicates understanding and agrees to proceed.   Active Problems:   Tobacco abuse -cessation advised   Melida QuitterSigned, Ramesha Poster , PA-C 8:57 AM 03/27/2014

## 2014-03-27 NOTE — ED Notes (Addendum)
Troponin 0.93 per lab Dr Julian ReilGardner aware

## 2014-03-27 NOTE — ED Notes (Signed)
Pt had sustain run of Vtac; feels fine; cards paged.

## 2014-03-27 NOTE — Progress Notes (Signed)
  Echocardiogram 2D Echocardiogram has been performed.  Alica Shellhammer FRANCES 03/27/2014, 3:41 PM

## 2014-03-27 NOTE — Care Management Note (Addendum)
    Page 1 of 2   04/02/2014     11:50:03 AM CARE MANAGEMENT NOTE 04/02/2014  Patient:  Thomas Woods,Thomas Woods   Account Number:  [id number]  Date Initiated:  03/27/2014  Documentation initiated by:  Junius CreamerWELL,DEBBIE  Subjective/Objective Assessment:   adm w mi     Action/Plan:   lives at home   Anticipated DC Date:     Anticipated DC Plan:  HOME/SELF CARE      DC Planning Services  CM consult  Medication Assistance  [facility]      Choice offered to / List presented to:  C-1 Patient   DME arranged  VEST - LIFE VEST      DME agency  OTHER - SEE NOTE        Status of service:  Completed, signed off Medicare Important Message given?  NO (If response is "NO", the following Medicare IM given date fields will be blank) Date Medicare IM given:   Medicare IM given by:   Date Additional Medicare IM given:   Additional Medicare IM given by:    Discharge Disposition:  HOME/SELF CARE  Per UR Regulation:  Reviewed for med. necessity/level of care/duration of stay  If discussed at Long Length of Stay Meetings, dates discussed:   04/01/2014    Comments:  04-02-14 1147 Tomi BambergerBrenda Graves-Bigelow, RN, BSN [phone] CM did [facility] for financial assistance for medications to be obtained from the pharmacy. Pt will go to the clinic today for assistance with Lipitor. Pt is aware of plan of care. [facility] has Brilinta available. No further needs from CM at this time.   04-01-14 1531 Tomi BambergerBrenda Graves-Bigelow, RN,BSN [phone] Pt will be fit for lifevest this evening. Pt's lease agreement sent to [employer]. FC did speak with pt in regards to Medicaid. [facility]. Will place appointment on AVS form. Pt will need to be d/c on all generic medications due to no insurance. Pt will benefit from a cheaper statin once medically stable for d/c. Per PA pt will be d/c on nicotine patches. CM did discuss with pt in regards to [facility] is aware that the [facility] has a pharmacy on site as well with cost ranging from $4.00 to $10.00. No further needs from CM at this time.  03-31-14 [address] Bon[address] Graves-Bigelow, [place],BSN [phone] CM did fax order form, face sheet, echo, cath notes, H&P and progress notes to [employer]. CSW ChiropodistAssistant Director notified that pt is without insurance for [employer]. CM will continue to monitor.   3/17 1421 debbie dowell rn,bsn spoke w pt. he does not have ins at present. gave him 30day free brilinta card. placed pt assist form on shadow chart for md.gave pt inform on [place] [facility]. pt appreciative of inform .

## 2014-03-27 NOTE — ED Notes (Signed)
Dr Julian ReilGardner aware of elevated troponin

## 2014-03-27 NOTE — Progress Notes (Signed)
Right radial band removed and clear dressing applied. Site is a level 0. Patient instructed to elevate wrist above heart level while at rest and to call RN if bleeding occurs.  Thomas Woods J  

## 2014-03-27 NOTE — Consult Note (Signed)
Reason for Consult: chest pain, mildly elevated troponin Primary cardiologist: new Referring Physician: Dr. Beryle Quant is an 49 y.o. male.  HPI: 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 The pain improved with rest. However, it returned after eating later in the day leading to ER presentation. EMS gave him sublingual NTG that nearly resolved his pain. He also notes some associated shortness of breath and occasional jaw discomfort. He has no upcoming surgery or bleeding issues. Troponin noted to be 0.16 and increased to 0.33 leading to cardiology consultation.  educed to 2/10, pain is now slowly increasing as SL NTG wears off.   Past Medical History  Diagnosis Date  . Hypertension     Past Surgical History  Procedure Laterality Date  . Hernia repair      No family history on file. He doesn't know his father's side of the family; no significant CAD in his mother's side of the family   Social History:  reports that he has been smoking Cigarettes.  He has been smoking about 0.50 packs per day. He does not have any smokeless tobacco history on file. He reports that he drinks alcohol. He reports that he does not use illicit drugs.  Allergies: No Known Allergies  Medications:  I have reviewed the patient's current medications. Prior to Admission:  (Not in a hospital admission) Scheduled: . nitroGLYCERIN  0.5 inch Topical 4 times per day  . pantoprazole  40 mg Oral Daily    Results for orders placed or performed during the hospital encounter of 03/26/14 (from the past 48 hour(s))  CBC     Status: None   Collection Time: 03/26/14  8:18 PM  Result Value Ref Range   WBC 5.5 4.0 - 10.5 K/uL   RBC 4.50 4.22 - 5.81 MIL/uL   Hemoglobin 14.1 13.0 - 17.0 g/dL   HCT 41.1 39.0 - 52.0 %   MCV 91.3 78.0 - 100.0 fL   MCH  31.3 26.0 - 34.0 pg   MCHC 34.3 30.0 - 36.0 g/dL   RDW 14.2 11.5 - 15.5 %   Platelets 186 150 - 400 K/uL  Basic metabolic panel     Status: None   Collection Time: 03/26/14  8:18 PM  Result Value Ref Range   Sodium 137 135 - 145 mmol/L   Potassium 3.9 3.5 - 5.1 mmol/L   Chloride 102 96 - 112 mmol/L   CO2 26 19 - 32 mmol/L   Glucose, Bld 83 70 - 99 mg/dL   BUN 11 6 - 23 mg/dL   Creatinine, Ser 0.98 0.50 - 1.35 mg/dL   Calcium 9.5 8.4 - 10.5 mg/dL   GFR calc non Af Amer >90 >90 mL/min   GFR calc Af Amer >90 >90 mL/min    Comment: (NOTE) The eGFR has been calculated using the CKD EPI equation. This calculation has not been validated in all clinical situations. eGFR's persistently <90 mL/min signify possible Chronic Kidney Disease.    Anion gap 9 5 - 15  BNP (order ONLY if patient complains of dyspnea/SOB AND you have documented it for THIS visit)     Status: None   Collection Time: 03/26/14  8:18 PM  Result Value Ref Range   B Natriuretic Peptide 48.4 0.0 - 100.0 pg/mL  I-stat troponin, ED (not at [facility])  Status: Abnormal   Collection Time: 03/26/14  8:25 PM  Result Value Ref Range   Troponin i, poc 0.10 (HH) 0.00 - 0.08 ng/mL   Comment NOTIFIED PHYSICIAN    Comment 3            Comment: Due to the release kinetics of cTnI, a negative result within the first hours of the onset of symptoms does not rule out myocardial infarction with certainty. If myocardial infarction is still suspected, repeat the test at appropriate intervals.   Troponin I     Status: Abnormal   Collection Time: 03/26/14  8:43 PM  Result Value Ref Range   Troponin I 0.16 (H) <0.031 ng/mL    Comment:        PERSISTENTLY INCREASED TROPONIN VALUES IN THE RANGE OF 0.04-0.49 ng/mL CAN BE SEEN IN:       -UNSTABLE ANGINA       -CONGESTIVE HEART FAILURE       -MYOCARDITIS       -CHEST TRAUMA       -ARRYHTHMIAS       -LATE PRESENTING MYOCARDIAL INFARCTION       -COPD   CLINICAL FOLLOW-UP RECOMMENDED.     Troponin I-serum (0, 3, 6 hours)     Status: Abnormal   Collection Time: 03/26/14 11:34 PM  Result Value Ref Range   Troponin I 0.33 (H) <0.031 ng/mL    Comment:        PERSISTENTLY INCREASED TROPONIN VALUES IN THE RANGE OF 0.04-0.49 ng/mL CAN BE SEEN IN:       -UNSTABLE ANGINA       -CONGESTIVE HEART FAILURE       -MYOCARDITIS       -CHEST TRAUMA       -ARRYHTHMIAS       -LATE PRESENTING MYOCARDIAL INFARCTION       -COPD   CLINICAL FOLLOW-UP RECOMMENDED.     Dg Chest 2 View  03/26/2014   CLINICAL DATA:  One day history of chest pain and hypertension  EXAM: CHEST  2 VIEW  COMPARISON:  None.  FINDINGS: There is bullous disease in the right apex. There is no edema or consolidation. On the lateral view, there is a 9 x 7 mm nodular opacity overlying the heart, not confirmed on frontal view.  Heart is upper normal in size with pulmonary vascularity within normal limits. No adenopathy. No bone lesions.  IMPRESSION: 9 x 7 mm nodular opacity on the lateral view overlying the heart. This finding cannot be confirmed on the frontal view. Given this finding, and noncontrast enhanced chest CT is advised to further assess.  Bullous disease right apex.  No edema or consolidation.   Electronically Signed   By: Lowella Grip III M.D.   On: 03/26/2014 21:48    Review of Systems  Constitutional: Negative for fever and chills.  HENT: Negative for ear pain.   Eyes: Negative for blurred vision and double vision.  Respiratory: Negative for cough and hemoptysis.   Cardiovascular: Positive for chest pain.  Gastrointestinal: Positive for heartburn. Negative for vomiting and abdominal pain.  Genitourinary: Negative for dysuria, urgency and frequency.  Musculoskeletal: Negative for myalgias and neck pain.  Skin: Negative for rash.  Neurological: Negative for dizziness, tingling, tremors and headaches.  Endo/Heme/Allergies: Negative for polydipsia. Does not bruise/bleed easily.  Psychiatric/Behavioral:  Positive for substance abuse. Negative for suicidal ideas and hallucinations.       Tobacco    Blood pressure 135/93, pulse 72, temperature  97.8 F (36.6 C), temperature source Oral, resp. rate 16, height 6' (1.829 m), weight 97.523 kg (215 lb), SpO2 98 %. Physical Exam  Nursing note and vitals reviewed. Constitutional: He is oriented to person, place, and time. He appears well-developed and well-nourished. He appears distressed.  Mildly uncomfortable  HENT:  Head: Normocephalic and atraumatic.  Nose: Nose normal.  Mouth/Throat: Oropharynx is clear and moist. No oropharyngeal exudate.  Eyes: Conjunctivae and EOM are normal. Pupils are equal, round, and reactive to light. No scleral icterus.  Neck: Normal range of motion. Neck supple. No JVD present. No tracheal deviation present.  Cardiovascular: Normal rate, regular rhythm, normal heart sounds and intact distal pulses.  Exam reveals no gallop.   No murmur heard. Respiratory: Effort normal and breath sounds normal. No respiratory distress. He has no wheezes. He has no rales.  GI: Soft. Bowel sounds are normal. He exhibits no distension. There is no tenderness. There is no rebound.  Musculoskeletal: Normal range of motion. He exhibits no edema or tenderness.  Neurological: He is alert and oriented to person, place, and time. No cranial nerve deficit. Coordination normal.  Skin: Skin is warm and dry. No rash noted. He is not diaphoretic. No erythema. No pallor.  Psychiatric: He has a normal mood and affect. His behavior is normal. Judgment and thought content normal.  labs reviewed as above Troponin 0.16 to 0.33  ECG: sinus rhythm, anterior infarct age indeterminate vs. Poor R-wave progression  Assessment/Plan: Problem List Chest Pain/NSTEMI Hypertension Dyslipidemia Small 9x7 mm potential lesion noted on chest x-ray; defer workup to primary team/hospitalist team  Mr. Raimundo Corbit is a 49 yo man with PMH of hypertension and tobacco use  who developed chest discomfort that is substernal, burning to heavy in character that developed after lifting heavy furniture at his job. Differential diagnosis is musculoskeletal pain, esophageal spasm, GERD, pericarditis, ACS/NSTEMI among other etiologies. I favor a diagnosis of NSTEMI given risk factors of tobacco use for 20+ years, anginal chest pain and elevated troponins. Plan for likely LHC tomorrow in AM. He is also being treated for potential GERD or esophageal spasm but the elevated troponin leans more heavily to cardiac origin of chest discomfort.   - NPO after MN - trend cardiac markers - observation on telemetry - heparin gtt per pharmacy - asa 81 mg, metoprolol 12.5 mg bid, atorvastatin 80 mg qHS - hba1c, tsh, lipid panel, BNP    Murrell Dome 03/27/2014, 12:51 AM

## 2014-03-27 NOTE — ED Notes (Signed)
Pt is asleep; family at bedside.

## 2014-03-28 ENCOUNTER — Encounter (HOSPITAL_COMMUNITY): Payer: Self-pay

## 2014-03-28 ENCOUNTER — Inpatient Hospital Stay (HOSPITAL_COMMUNITY): Payer: Medicaid Other

## 2014-03-28 DIAGNOSIS — R918 Other nonspecific abnormal finding of lung field: Secondary | ICD-10-CM

## 2014-03-28 DIAGNOSIS — I2109 ST elevation (STEMI) myocardial infarction involving other coronary artery of anterior wall: Secondary | ICD-10-CM

## 2014-03-28 DIAGNOSIS — I255 Ischemic cardiomyopathy: Secondary | ICD-10-CM

## 2014-03-28 HISTORY — DX: Other nonspecific abnormal finding of lung field: R91.8

## 2014-03-28 LAB — CBC
HCT: 42.8 % (ref 39.0–52.0)
Hemoglobin: 14.8 g/dL (ref 13.0–17.0)
MCH: 31.7 pg (ref 26.0–34.0)
MCHC: 34.6 g/dL (ref 30.0–36.0)
MCV: 91.6 fL (ref 78.0–100.0)
Platelets: 185 10*3/uL (ref 150–400)
RBC: 4.67 MIL/uL (ref 4.22–5.81)
RDW: 14.2 % (ref 11.5–15.5)
WBC: 6.4 10*3/uL (ref 4.0–10.5)

## 2014-03-28 LAB — COMPREHENSIVE METABOLIC PANEL
ALK PHOS: 39 U/L (ref 39–117)
ALT: 23 U/L (ref 0–53)
AST: 94 U/L — ABNORMAL HIGH (ref 0–37)
Albumin: 3.8 g/dL (ref 3.5–5.2)
Anion gap: 6 (ref 5–15)
BILIRUBIN TOTAL: 1 mg/dL (ref 0.3–1.2)
BUN: 7 mg/dL (ref 6–23)
CHLORIDE: 103 mmol/L (ref 96–112)
CO2: 28 mmol/L (ref 19–32)
Calcium: 9.5 mg/dL (ref 8.4–10.5)
Creatinine, Ser: 1.07 mg/dL (ref 0.50–1.35)
GFR, EST NON AFRICAN AMERICAN: 81 mL/min — AB (ref >90)
Glucose, Bld: 96 mg/dL (ref 70–99)
POTASSIUM: 4.3 mmol/L (ref 3.5–5.1)
SODIUM: 137 mmol/L (ref 135–145)
Total Protein: 6.7 g/dL (ref 6.0–8.3)

## 2014-03-28 LAB — HEPARIN LEVEL (UNFRACTIONATED): Heparin Unfractionated: 0.25 IU/mL — ABNORMAL LOW (ref 0.30–0.70)

## 2014-03-28 MED ORDER — VARENICLINE TARTRATE 0.5 MG PO TABS
0.5000 mg | ORAL_TABLET | Freq: Every day | ORAL | Status: AC
  Administered 2014-03-28 – 2014-03-30 (×3): 0.5 mg via ORAL
  Filled 2014-03-28 (×3): qty 1

## 2014-03-28 MED ORDER — WARFARIN SODIUM 10 MG PO TABS
10.0000 mg | ORAL_TABLET | Freq: Once | ORAL | Status: AC
  Administered 2014-03-28: 10 mg via ORAL
  Filled 2014-03-28 (×2): qty 1

## 2014-03-28 MED ORDER — VARENICLINE TARTRATE 1 MG PO TABS: 1.0000 mg | ORAL_TABLET | Freq: Two times a day (BID) | ORAL | Status: DC

## 2014-03-28 MED ORDER — WARFARIN VIDEO: Freq: Once | Status: DC

## 2014-03-28 MED ORDER — WARFARIN - PHARMACIST DOSING INPATIENT
Freq: Every day | Status: DC
  Administered 2014-03-31: 18:00:00
  Administered 2014-04-01: 1

## 2014-03-28 MED ORDER — COUMADIN BOOK
Freq: Once | Status: AC
  Administered 2014-03-28: 16:00:00
  Filled 2014-03-28: qty 1

## 2014-03-28 MED ORDER — THE SENSUOUS HEART BOOK
Freq: Once | Status: AC
  Administered 2014-03-28: 1
  Filled 2014-03-28: qty 1

## 2014-03-28 MED ORDER — EXERCISE FOR HEART AND HEALTH BOOK
Freq: Once | Status: AC
  Administered 2014-03-28: 1
  Filled 2014-03-28: qty 1

## 2014-03-28 MED ORDER — HEPARIN (PORCINE) IN NACL 100-0.45 UNIT/ML-% IJ SOLN
1500.0000 [IU]/h | INTRAMUSCULAR | Status: DC
  Administered 2014-03-28 (×2): 1300 [IU]/h via INTRAVENOUS
  Administered 2014-03-29 – 2014-04-01 (×4): 1500 [IU]/h via INTRAVENOUS
  Filled 2014-03-28 (×7): qty 250

## 2014-03-28 MED ORDER — VARENICLINE TARTRATE 0.5 MG PO TABS
0.5000 mg | ORAL_TABLET | Freq: Two times a day (BID) | ORAL | Status: DC
  Administered 2014-03-31 – 2014-04-02 (×5): 0.5 mg via ORAL
  Filled 2014-03-28 (×6): qty 1

## 2014-03-28 NOTE — Progress Notes (Addendum)
Patient Name: Thomas Woods Date of Encounter: 03/28/2014  Principal Problem:   NSTEMI, initial episode of care Active Problems:   Tobacco abuse   Acute anterior wall MI   NSTEMI (non-ST elevated myocardial infarction)   Length of Stay: 1  SUBJECTIVE  The patient feels well, he started to walk and denies any chest pain or SOB  CURRENT MEDS . angioplasty book   Does not apply Once  . aspirin  81 mg Oral Daily  . atorvastatin  80 mg Oral q1800  . metoprolol tartrate  12.5 mg Oral BID  . multivitamin with minerals  1 tablet Oral Daily  . nicotine  14 mg Transdermal Daily  . pantoprazole  40 mg Oral Daily  . sodium chloride  3 mL Intravenous Q12H  . ticagrelor  90 mg Oral BID    OBJECTIVE  Filed Vitals:   03/28/14 0400 03/28/14 0425 03/28/14 0617 03/28/14 0725  BP:  117/84 118/75   Pulse: 71 64 70 68  Temp:  98.1 F (36.7 C)  98.6 F (37 C)  TempSrc:  Oral  Oral  Resp:      Height:      Weight: 214 lb 1.1 oz (97.1 kg)     SpO2: 92% 95% 100% 96%    Intake/Output Summary (Last 24 hours) at 03/28/14 0857 Last data filed at 03/27/14 2200  Gross per 24 hour  Intake 1378.38 ml  Output    500 ml  Net 878.38 ml   Filed Weights   03/26/14 1954 03/27/14 1100 03/28/14 0400  Weight: 215 lb (97.523 kg) 217 lb 9.5 oz (98.7 kg) 214 lb 1.1 oz (97.1 kg)    PHYSICAL EXAM  General: Pleasant, NAD. Neuro: Alert and oriented X 3. Moves all extremities spontaneously. Psych: Normal affect. HEENT:  Normal  Neck: Supple without bruits or JVD. Lungs:  Resp regular and unlabored, CTA. Heart: RRR no s3, s4, 2/6 systolic murmur. Abdomen: Soft, non-tender, non-distended, BS + x 4.  Extremities: No clubbing, cyanosis or edema. DP/PT/Radials 2+ and equal bilaterally.  Accessory Clinical Findings  CBC  Recent Labs  03/27/14 0833 03/27/14 1708 03/28/14 0303  WBC 6.0  --  6.4  HGB 14.8  --  14.8  HCT 42.4  --  42.8  MCV 90.6  --  91.6  PLT 190 168 185   Basic Metabolic  Panel  Recent Labs  69/62/95 2018 03/28/14 0303  NA 137 137  K 3.9 4.3  CL 102 103  CO2 26 28  GLUCOSE 83 96  BUN 11 7  CREATININE 0.98 1.07  CALCIUM 9.5 9.5   Liver Function Tests  Recent Labs  03/27/14 0833 03/28/14 0303  AST 65* 94*  ALT 17 23  ALKPHOS 39 39  BILITOT 1.3* 1.0  PROT 7.3 6.7  ALBUMIN 3.9 3.8   No results for input(s): LIPASE, AMYLASE in the last 72 hours. Cardiac Enzymes  Recent Labs  03/26/14 2334 03/27/14 0218 03/27/14 0413  TROPONINI 0.33* 0.93* 1.41*    Recent Labs  03/27/14 0833  CHOL 210*  HDL 64  LDLCALC 134*  TRIG 62  CHOLHDL 3.3   Thyroid Function Tests  Recent Labs  03/27/14 0833  TSH 0.769    Radiology/Studies  Ct Chest Wo Contrast  03/28/2014   CLINICAL DATA:  Pulmonary nodule seen on recent chest radiograph.   Musculoskeletal/Soft Tissues: No suspicious bone lesions or other significant chest wall abnormality.  Upper Abdomen:  Unremarkable.  IMPRESSION: Several indeterminate right lung  nodules measuring up to 6 mm. If the patient is at high risk for bronchogenic carcinoma, follow-up chest CT at 6-12 months is recommended. If the patient is at low risk for bronchogenic carcinoma, follow-up chest CT at 12 months is recommended. This recommendation follows the consensus statement: Guidelines for Management of Small Pulmonary Nodules Detected on CT Scans: A Statement from the Fleischner Society as published in Radiology 2005;237:395-400.     TELE: SR, nsVT  ECG: SR, negative T waves in the inferior leads, Q in anterior laeds with profound negative T waves in teh anterolateral leads.  Left cardiac cath: 03/27/2014 IMPRESSIONS: Normal left main coronary artery. 1. 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 This was successfully treated with aspiration thrombectomy which restored TIMI 3 flow. This was followed by overlapping drug-eluting stent placement with a 2.5 x 38  Synergy drug-eluting stent and a 3.0 x 32 drug-eluting stent to cover a long area of disease. 2. Mild to moderate disease in the left circumflex artery and its branches. 3. Moderate disease in the mid right coronary artery. 4. Moderately decreased left ventricular systolic function. LVEDP 16 mmHg. Ejection fraction 30%.  RECOMMENDATION: Continue aggressive secondary prevention including smoking cessation. He'll need dual antiplatelet therapy for at least a year and likely longer given the other moderate disease that he has. He will be watched in the ICU. I expect he will be in the hospital for at least a day or 2. Hopefully, he will have some recovery of his left ventricular function now that he has been revascularized.  Echo: 03/27/2014 Study Conclusions  - Procedure narrative: Transthoracic echocardiography. Image quality was adequate. The study was technically difficult. Intravenous contrast (Definity) was administered. - Left ventricle: The cavity size was normal. Wall thickness was normal. Systolic function was severely reduced. The estimated ejection fraction was in the range of 25% to 30%. There is akinesis of the mid-apicalanteroseptal and apical myocardium. Doppler parameters are consistent with abnormal left ventricular relaxation (grade 1 diastolic dysfunction). - Aortic valve: There was trivial regurgitation. - Mitral valve: There was mild regurgitation.  Impressions:  - Akinesis of the distal anteroseptal and apical myocardium; overall, severely reduced LV function; possible apical thrombus noted usind definiity; grade 1 diastolic dysfunction; mild MR.    ASSESSMENT AND PLAN  49 year old smoker admitted yesterday with anterior STEMI  1. CAD, STEMI, with occluded mid LAD treated with aspiration thrombectomy and placement of overlapping DES with a 2.5 x 38 Synergy drug-eluting stent and a 3.0 x 32 drug-eluting stent to cover a long area of disease. -  continue ASA 81 mg po daily, Brillinta for at least a year, atorvastatin 80 mg po daily  2. Ischemic cardiomyopathy - LVEF severely reduced 25-50%, akinesis of the distal anteroseptal and apical myocardium; overall, severely reduced LV function; possible apical thrombus noted using definiity; Q waves in the anterior leads, he might not recover from this as he came almost 24 hours after pain started. - we will start lisinopril 2.5 mg po daily - echo images with Definity suspicious for apical thrombus, we will start heparin and coumadine.  3. Hypertension - controlled on metoprolol  4. Lung nodule - incidentally found on lung CT, as a smoker he is a high risk, he will need follow up CT in 6 months.   5. Smoking cessation - we will start Chantix - there is increased risk of CV events, however still lower than if he continues to smoke  6. nsVT - reperfusion, on metoprolol   Signed, Lars Masson MD, [facility] 03/28/2014

## 2014-03-28 NOTE — Progress Notes (Addendum)
ANTICOAGULATION CONSULT NOTE - Initial Consult  Pharmacy Consult for heparin/coumadin Indication: LV thrombus  No Known Allergies  Patient Measurements: Height: 6' (182.9 cm) Weight: 214 lb 1.1 oz (97.1 kg) IBW/kg (Calculated) : 77.6 Heparin Dosing Weight: 97kg  Vital Signs: Temp: 97.4 F (36.3 C) (03/18 1122) Temp Source: Oral (03/18 1122) BP: 129/81 mmHg (03/18 1122) Pulse Rate: 66 (03/18 1122)  Labs:  Recent Labs  03/26/14 2018  03/26/14 2334 03/27/14 0218 03/27/14 0413 03/27/14 0833 03/27/14 1708 03/28/14 0303  HGB 14.1  --   --   --   --  14.8  --  14.8  HCT 41.1  --   --   --   --  42.4  --  42.8  PLT 186  --   --   --   --  190 168 185  LABPROT  --   --   --   --   --  14.1  --   --   INR  --   --   --   --   --  1.08  --   --   HEPARINUNFRC  --   --   --   --   --  0.45  --   --   CREATININE 0.98  --   --   --   --   --   --  1.07  TROPONINI  --   < > 0.33* 0.93* 1.41*  --   --   --   < > = values in this interval not displayed.  Estimated Creatinine Clearance: 103.1 mL/min (by C-G formula based on Cr of 1.07).   Medical History: Past Medical History  Diagnosis Date  . Hypertension     Medications:  Scheduled:  . angioplasty book   Does not apply Once  . aspirin  81 mg Oral Daily  . atorvastatin  80 mg Oral q1800  . metoprolol tartrate  12.5 mg Oral BID  . multivitamin with minerals  1 tablet Oral Daily  . nicotine  14 mg Transdermal Daily  . pantoprazole  40 mg Oral Daily  . sodium chloride  3 mL Intravenous Q12H  . ticagrelor  90 mg Oral BID  . varenicline  0.5 mg Oral Daily   Followed by  . [START ON 03/31/2014] varenicline  0.5 mg Oral BID   Followed by  . [START ON 04/04/2014] varenicline  1 mg Oral BID    Assessment: 49 yo male here with NSTEMI now s/p cath with DES to the LAD now with and echo showing LV thrombus. Pheamcy has been consulted for heparin and coumadin. He was on heparin pre-cath at 1300 units/hr with initial heparin level  = 0.45. Baseline INR= 1.08 on 03/27/14. He is also noted on Brilinta and aspirin  Goal of Therapy:  INR 2-3 Heparin level 0.3-0.7 units/ml Monitor platelets by anticoagulation protocol: Yes   Plan:  -Restart heparin at 1300 units/hr -Heparin level in 6 hrs -Coumadin 10mg  po x1 -Daily PT/INR -Will begin education process -Could consider Brilinta with coumadin and discontinue aspirin?  Harland GermanAndrew Meyer, Pharm D 03/28/2014 12:49 PM   Addendum: Heparin level is slightly subtherapeutic at 0.25 on 1300 units/hr. No bleeding noted.  Increase heparin drip to 1500 units/hr 6 hr heparin level Daily heparin level and CBC  Viewmont Surgery CenterJennifer Lillie, [address] Rainbow BoulevardPharm.D., BCPS Clinical Pharmacist Pager: [phone] 03/28/2014 8:44 PM

## 2014-03-28 NOTE — Progress Notes (Signed)
  Echocardiogram 2D Echocardiogram has been performed.  Janalyn HarderWest, Nivek Powley R 03/28/2014, 10:54 AM

## 2014-03-28 NOTE — Progress Notes (Signed)
CARDIAC REHAB PHASE I   PRE:  Rate/Rhythm: 79 SR  BP:  Supine:   Sitting: 129/81  Standing:    SaO2:   MODE:  Ambulation: 700 ft   POST:  Rate/Rhythm: 74 SR  BP:  Supine:   Sitting: 102/75  Standing:    SaO2:  1120-1235 Pt walked 700 ft with steady gait. Tolerated well. No dizziness or CP. MI education completed with pt and wife who voiced understanding. Pt ready to make changes. Stressed importance of brilinta and stent. Pt has stent booklet and case manager assisting pt. Reviewed MI restrictions, NTG use, risk factor modification including smoking cessation. Pt stated is going to continue nicotine patches and chantix. Gave pt fake cigarette and smoking cessation handouts. Motivated to quit. Reviewed heart healthy diet and low sodium. Discussed with pt that with low EF would have to watch sodium intake and avoid alcohol. Discussed CRP 2 and pt gave permission to refer to [place]. Gave financial aid form to complete to see if assistance available for program.  Wife very supportive.   Luetta Nuttingharlene Aurel Nguyen, RN BSN  03/28/2014 12:28 PM

## 2014-03-29 DIAGNOSIS — I513 Intracardiac thrombosis, not elsewhere classified: Secondary | ICD-10-CM | POA: Diagnosis not present

## 2014-03-29 DIAGNOSIS — I209 Angina pectoris, unspecified: Secondary | ICD-10-CM

## 2014-03-29 DIAGNOSIS — I213 ST elevation (STEMI) myocardial infarction of unspecified site: Secondary | ICD-10-CM

## 2014-03-29 LAB — CBC
HCT: 42.1 % (ref 39.0–52.0)
Hemoglobin: 14.5 g/dL (ref 13.0–17.0)
MCH: 31.8 pg (ref 26.0–34.0)
MCHC: 34.4 g/dL (ref 30.0–36.0)
MCV: 92.3 fL (ref 78.0–100.0)
PLATELETS: 183 10*3/uL (ref 150–400)
RBC: 4.56 MIL/uL (ref 4.22–5.81)
RDW: 14.2 % (ref 11.5–15.5)
WBC: 6 10*3/uL (ref 4.0–10.5)

## 2014-03-29 LAB — HEPARIN LEVEL (UNFRACTIONATED)
Heparin Unfractionated: 0.37 IU/mL (ref 0.30–0.70)
Heparin Unfractionated: 0.47 IU/mL (ref 0.30–0.70)

## 2014-03-29 LAB — PROTIME-INR
INR: 1.07 (ref 0.00–1.49)
PROTHROMBIN TIME: 14 s (ref 11.6–15.2)

## 2014-03-29 MED ORDER — WARFARIN SODIUM 10 MG PO TABS
10.0000 mg | ORAL_TABLET | Freq: Once | ORAL | Status: AC
  Administered 2014-03-29: 10 mg via ORAL
  Filled 2014-03-29: qty 1

## 2014-03-29 NOTE — Progress Notes (Signed)
SUBJECTIVE:  Walking in hall without chest pain or SOB  OBJECTIVE:   Vitals:   Filed Vitals:   03/28/14 1122 03/28/14 1602 03/28/14 2046 03/29/14 0500  BP: 129/81 105/71 107/75 109/88  Pulse: 66 73 74 78  Temp: 97.4 F (36.3 C) 98.1 F (36.7 C) 98.5 F (36.9 C) 98.1 F (36.7 C)  TempSrc: Oral Oral Oral Oral  Resp:  16 18 18   Height:  6\' 1"  (1.854 m)    Weight:  214 lb 9.6 oz (97.342 kg)  214 lb 4.8 oz (97.206 kg)  SpO2: 92% 100% 100% 100%   I&O's:   Intake/Output Summary (Last 24 hours) at 03/29/14 1330 Last data filed at 03/29/14 0700  Gross per 24 hour  Intake    600 ml  Output      0 ml  Net    600 ml   TELEMETRY: Reviewed telemetry pt in NSR:     PHYSICAL EXAM General: Well developed, well nourished, in no acute distress Head: Eyes PERRLA, No xanthomas.   Normal cephalic and atramatic  Lungs:   Clear bilaterally to auscultation and percussion. Heart:   HRRR S1 S2 Pulses are 2+ & equal. Abdomen: Bowel sounds are positive, abdomen soft and non-tender without masses  Extremities:   No clubbing, cyanosis or edema.  DP +1 Neuro: Alert and oriented X 3. Psych:  Good affect, responds appropriately   LABS: Basic Metabolic Panel:  Recent Labs  57/84/6903/16/16 2018 03/28/14 0303  NA 137 137  K 3.9 4.3  CL 102 103  CO2 26 28  GLUCOSE 83 96  BUN 11 7  CREATININE 0.98 1.07  CALCIUM 9.5 9.5   Liver Function Tests:  Recent Labs  03/27/14 0833 03/28/14 0303  AST 65* 94*  ALT 17 23  ALKPHOS 39 39  BILITOT 1.3* 1.0  PROT 7.3 6.7  ALBUMIN 3.9 3.8   No results for input(s): LIPASE, AMYLASE in the last 72 hours. CBC:  Recent Labs  03/28/14 0303 03/29/14 0500  WBC 6.4 6.0  HGB 14.8 14.5  HCT 42.8 42.1  MCV 91.6 92.3  PLT 185 183   Cardiac Enzymes:  Recent Labs  03/26/14 2334 03/27/14 0218 03/27/14 0413  TROPONINI 0.33* 0.93* 1.41*   BNP: Invalid input(s): POCBNP D-Dimer: No results for input(s): DDIMER in the last 72 hours. Hemoglobin  A1C: No results for input(s): HGBA1C in the last 72 hours. Fasting Lipid Panel:  Recent Labs  03/27/14 0833  CHOL 210*  HDL 64  LDLCALC 134*  TRIG 62  CHOLHDL 3.3   Thyroid Function Tests:  Recent Labs  03/27/14 0833  TSH 0.769   Anemia Panel: No results for input(s): VITAMINB12, FOLATE, FERRITIN, TIBC, IRON, RETICCTPCT in the last 72 hours. Coag Panel:   Lab Results  Component Value Date   INR 1.07 03/29/2014   INR 1.08 03/27/2014    RADIOLOGY: Dg Chest 2 View  03/26/2014   CLINICAL DATA:  One day history of chest pain and hypertension  EXAM: CHEST  2 VIEW  COMPARISON:  None.  FINDINGS: There is bullous disease in the right apex. There is no edema or consolidation. On the lateral view, there is a 9 x 7 mm nodular opacity overlying the heart, not confirmed on frontal view.  Heart is upper normal in size with pulmonary vascularity within normal limits. No adenopathy. No bone lesions.  IMPRESSION: 9 x 7 mm nodular opacity on the lateral view overlying the heart. This finding cannot be confirmed on  the frontal view. Given this finding, and noncontrast enhanced chest CT is advised to further assess.  Bullous disease right apex.  No edema or consolidation.   Electronically Signed   By: Bretta Bang III M.D.   On: 03/26/2014 21:48   Ct Chest Wo Contrast  03/28/2014   CLINICAL DATA:  Pulmonary nodule seen on recent chest radiograph.  EXAM: CT CHEST WITHOUT CONTRAST  TECHNIQUE: Multidetector CT imaging of the chest was performed following the standard protocol without IV contrast.  COMPARISON:  Chest radiograph on 03/26/2014  FINDINGS: Mediastinum/Lymph Nodes: No masses or pathologically enlarged lymph nodes identified. Coronary artery calcification incidentally noted.  Lungs/Pleura: In the right mid lung, there are several tiny indeterminate pulmonary nodules measuring 5-6 mm in the perihilar region and peripheral subpleural region on images 29, 39, and 45 or of series 205. No evidence  of pulmonary airspace disease or pleural effusion. Mild subpleural bullous disease noted in both lung apices.  Musculoskeletal/Soft Tissues: No suspicious bone lesions or other significant chest wall abnormality.  Upper Abdomen:  Unremarkable.  IMPRESSION: Several indeterminate right lung nodules measuring up to 6 mm. If the patient is at high risk for bronchogenic carcinoma, follow-up chest CT at 6-12 months is recommended. If the patient is at low risk for bronchogenic carcinoma, follow-up chest CT at 12 months is recommended. This recommendation follows the consensus statement: Guidelines for Management of Small Pulmonary Nodules Detected on CT Scans: A Statement from the Fleischner Society as published in Radiology 2005;237:395-400.   Electronically Signed   By: Myles Rosenthal M.D.   On: 03/28/2014 07:59    Principal Problem:  NSTEMI, initial episode of care Active Problems:  Tobacco abuse  Acute anterior wall MI  NSTEMI (non-ST elevated myocardial infarction)   ASSESSMENT AND PLAN  48 year old smoker admitted  with anterior STEMI  1. CAD, STEMI, with occluded mid LAD treated with aspiration thrombectomy and placement of overlapping DES with a 2.5 x 38 Synergy drug-eluting stent and a 3.0 x 32 drug-eluting stent to cover a long area of disease. - continue ASA 81 mg po daily, Brillinta for at least a year, atorvastatin 80 mg po daily  2. Ischemic cardiomyopathy - LVEF severely reduced 25-50%, akinesis of the distal anteroseptal and apical myocardium; overall, severely reduced LV function; possible apical thrombus noted using definiity; Q waves in the anterior leads, he might not recover from this as he came almost 24 hours after pain started. - continue lisinopril 2.5 mg po daily - echo images with Definity suspicious for apical thrombus, continue heparin while loading coumadin.  He really wants to go home but he does not have insurance so Lovenox injections will be too costly on self pay  so will have to continue IV Heparin bridge in hospital  3. Hypertension - controlled on metoprolol  4. Lung nodule - incidentally found on lung CT, as a smoker he is a high risk, he will need follow up CT in 6 months.  5. Smoking cessation -  Chantix started - there is increased risk of CV events, however still lower than if he continues to smoke  6. nsVT - reperfusion, on metoprolol with no reoccurence  Quintella Reichert, MD  03/29/2014  1:30 PM

## 2014-03-29 NOTE — Progress Notes (Signed)
Pt has started on chantix. Pt states he does not need patch anymore. Cecille Rubinhompson,Kloey Cazarez V, RN

## 2014-03-29 NOTE — Progress Notes (Addendum)
ANTICOAGULATION CONSULT NOTE - Follow Up Consult  Pharmacy Consult for Heparin/Coumadin Indication: LV thrombus  No Known Allergies  Patient Measurements: Height: 6\' 1"  (185.4 cm) Weight: 214 lb 4.8 oz (97.206 kg) IBW/kg (Calculated) : 79.9 Heparin Dosing Weight: 97 kg  Vital Signs: Temp: 98.1 F (36.7 C) (03/19 0500) Temp Source: Oral (03/19 0500) BP: 109/88 mmHg (03/19 0500) Pulse Rate: 78 (03/19 0500)  Labs:  Recent Labs  03/26/14 2018  03/26/14 2334 03/27/14 0218 03/27/14 0413 03/27/14 0833 03/27/14 1708 03/28/14 0303 03/28/14 1910 03/29/14 0426 03/29/14 0500  HGB 14.1  --   --   --   --  14.8  --  14.8  --   --  14.5  HCT 41.1  --   --   --   --  42.4  --  42.8  --   --  42.1  PLT 186  --   --   --   --  190 168 185  --   --  183  LABPROT  --   --   --   --   --  14.1  --   --   --  14.0  --   INR  --   --   --   --   --  1.08  --   --   --  1.07  --   HEPARINUNFRC  --   --   --   --   --  0.45  --   --  0.25* 0.37  --   CREATININE 0.98  --   --   --   --   --   --  1.07  --   --   --   TROPONINI  --   < > 0.33* 0.93* 1.41*  --   --   --   --   --   --   < > = values in this interval not displayed.  Estimated Creatinine Clearance: 104.8 mL/min (by C-G formula based on Cr of 1.07).  Assessment: 49 year old male admitted with NSTEMI found to have LV thrombus on ECHO on IV heparin and coumadin bridge day #2.   INR today is sub-therapeutic at 1.07. Coumadin 10mg  given last PM- dose charted.  Heparin level this AM is therapeutic at 0.37 on 1500 units/hr.  CBC is within normal limits and stable.  No bleeding reported.   Goal of Therapy:  INR 2-3 Heparin level 0.3-0.7 units/ml Monitor platelets by anticoagulation protocol: Yes   Plan:  Continue Heparin at 1500 units/hr. Recheck heparin level at 6 hours to confirm. Coumadin 10mg  again x1 tonight. Daily PT/INR while on therapy.   **MD-Could consider Brilinta with coumadin and discontinue  aspirin?  Link SnufferJessica Marjarie Irion, PharmD, BCPS Clinical Pharmacist [phone] 03/29/2014,9:14 AM   Addendum: Repeat heparin level is therapeutic at 0.47 on 1500 units/hr.  Plan: Continue Heparin at 1500 units/hr. Follow-up CBC and HL in AM.   Link SnufferJessica Kalianna Verbeke, PharmD, BCPS Clinical Pharmacist [phone] 03/29/2014, 11:29 AM

## 2014-03-29 NOTE — Progress Notes (Signed)
CARDIAC REHAB PHASE I   PRE:  Rate/Rhythm: 69 sinus  BP:  Supine:   Sitting:   Standing: 131/95      MODE:  Ambulation: 550 ft   POST:  Rate/Rhythem: 76  BP:  Supine:   Sitting: 136/93  Standing:     Pt ambulated 550 ft with assist x1.  Tolerated walk well without complaint.  Pt is eager to go home.  Pt encouraged to continue to walk with staff over weekend and we will f/u on Monday.  Pt would like help to go ahead and get paperwork started for financial assistance for meds and hospital stay. Thomas PierceJessica Oryan Winterton, MA, ACSM RCEP [phone] [place], [place]

## 2014-03-30 LAB — PROTIME-INR
INR: 1.09 (ref 0.00–1.49)
Prothrombin Time: 14.2 seconds (ref 11.6–15.2)

## 2014-03-30 LAB — CBC
HCT: 42.3 % (ref 39.0–52.0)
Hemoglobin: 14.5 g/dL (ref 13.0–17.0)
MCH: 32.1 pg (ref 26.0–34.0)
MCHC: 34.3 g/dL (ref 30.0–36.0)
MCV: 93.6 fL (ref 78.0–100.0)
Platelets: 184 10*3/uL (ref 150–400)
RBC: 4.52 MIL/uL (ref 4.22–5.81)
RDW: 14.2 % (ref 11.5–15.5)
WBC: 5.7 10*3/uL (ref 4.0–10.5)

## 2014-03-30 LAB — HEPARIN LEVEL (UNFRACTIONATED): Heparin Unfractionated: 0.48 IU/mL (ref 0.30–0.70)

## 2014-03-30 MED ORDER — WARFARIN SODIUM 2.5 MG PO TABS
12.5000 mg | ORAL_TABLET | Freq: Once | ORAL | Status: AC
  Administered 2014-03-30: 12.5 mg via ORAL
  Filled 2014-03-30 (×2): qty 1

## 2014-03-30 NOTE — Progress Notes (Signed)
ANTICOAGULATION CONSULT NOTE - Follow Up Consult  Pharmacy Consult for Heparin/Coumadin Indication: LV thrombus  No Known Allergies  Patient Measurements: Height: 6\' 1"  (185.4 cm) Weight: 214 lb 9.5 oz (97.34 kg) IBW/kg (Calculated) : 79.9 Heparin Dosing Weight: 97 kg  Vital Signs: Temp: 98.5 F (36.9 C) (03/20 0607) Temp Source: Oral (03/20 0607) BP: 105/70 mmHg (03/20 0607) Pulse Rate: 68 (03/20 0607)  Labs:  Recent Labs  03/27/14 0833  03/28/14 0303  03/29/14 0426 03/29/14 0500 03/29/14 1010 03/30/14 0406  HGB 14.8  --  14.8  --   --  14.5  --  14.5  HCT 42.4  --  42.8  --   --  42.1  --  42.3  PLT 190  < > 185  --   --  183  --  184  LABPROT 14.1  --   --   --  14.0  --   --  14.2  INR 1.08  --   --   --  1.07  --   --  1.09  HEPARINUNFRC 0.45  --   --   < > 0.37  --  0.47 0.48  CREATININE  --   --  1.07  --   --   --   --   --   < > = values in this interval not displayed.  Estimated Creatinine Clearance: 104.9 mL/min (by C-G formula based on Cr of 1.07).  Assessment: 49 year old male admitted with NSTEMI found to have LV thrombus on ECHO on IV heparin and coumadin bridge day #3.   INR today is sub-therapeutic at 1.09 with little movement after 2 doses of 10mg . Will increase dose today.  Heparin level this AM is therapeutic at 0.48 on 1500 units/hr.   CBC is within normal limits and stable.  No bleeding reported.   Goal of Therapy:  INR 2-3 Heparin level 0.3-0.7 units/ml Monitor platelets by anticoagulation protocol: Yes   Plan:  Increase Coumadin to 12.5mg  po x1 tonight.  Daily PT/INR while on therapy.  Continue Heparin at 1500 units/hr. Recheck heparin level at 6 hours to confirm.  Link SnufferJessica Mung Rinker, PharmD, BCPS Clinical Pharmacist [phone] 03/30/2014,7:48 AM

## 2014-03-30 NOTE — Progress Notes (Signed)
SUBJECTIVE:  No complaints  OBJECTIVE:   Vitals:   Filed Vitals:   03/29/14 1346 03/29/14 2147 03/30/14 0607 03/30/14 0952  BP: 101/70 124/76 105/70 131/91  Pulse: 67 72 68 81  Temp: 97.9 F (36.6 C) 98.4 F (36.9 C) 98.5 F (36.9 C)   TempSrc: Oral Oral Oral   Resp: Height:      Weight:   214 lb 9.5 oz (97.34 kg)   SpO2: 100% 100% 97%    I&O's:   Intake/Output Summary (Last 24 hours) at 03/30/14 1159 Last data filed at 03/30/14 0900  Gross per 24 hour  Intake   1145 ml  Output    300 ml  Net    845 ml   TELEMETRY: Reviewed telemetry pt in NSR:     PHYSICAL EXAM General: Well developed, well nourished, in no acute distress Head: Eyes PERRLA, No xanthomas.   Normal cephalic and atramatic  Lungs:   Clear bilaterally to auscultation and percussion. Heart:   HRRR S1 S2 Pulses are 2+ & equal. Abdomen: Bowel sounds are positive, abdomen soft and non-tender without masses  Extremities:   No clubbing, cyanosis or edema.  DP +1 Neuro: Alert and oriented X 3. Psych:  Good affect, responds appropriately   LABS: Basic Metabolic Panel:  Recent Labs  16/10/96 0303  NA 137  K 4.3  CL 103  CO2 28  GLUCOSE 96  BUN 7  CREATININE 1.07  CALCIUM 9.5   Liver Function Tests:  Recent Labs  03/28/14 0303  AST 94*  ALT 23  ALKPHOS 39  BILITOT 1.0  PROT 6.7  ALBUMIN 3.8   No results for input(s): LIPASE, AMYLASE in the last 72 hours. CBC:  Recent Labs  03/29/14 0500 03/30/14 0406  WBC 6.0 5.7  HGB 14.5 14.5  HCT 42.1 42.3  MCV 92.3 93.6  PLT 183 184   Cardiac Enzymes: No results for input(s): CKTOTAL, CKMB, CKMBINDEX, TROPONINI in the last 72 hours. BNP: Invalid input(s): POCBNP D-Dimer: No results for input(s): DDIMER in the last 72 hours. Hemoglobin A1C: No results for input(s): HGBA1C in the last 72 hours. Fasting Lipid Panel: No results for input(s): CHOL, HDL, LDLCALC, TRIG, CHOLHDL, LDLDIRECT in the last 72 hours. Thyroid Function  Tests: No results for input(s): TSH, T4TOTAL, T3FREE, THYROIDAB in the last 72 hours.  Invalid input(s): FREET3 Anemia Panel: No results for input(s): VITAMINB12, FOLATE, FERRITIN, TIBC, IRON, RETICCTPCT in the last 72 hours. Coag Panel:   Lab Results  Component Value Date   INR 1.09 03/30/2014   INR 1.07 03/29/2014   INR 1.08 03/27/2014    RADIOLOGY: Dg Chest 2 View  03/26/2014   CLINICAL DATA:  One day history of chest pain and hypertension  EXAM: CHEST  2 VIEW  COMPARISON:  None.  FINDINGS: There is bullous disease in the right apex. There is no edema or consolidation. On the lateral view, there is a 9 x 7 mm nodular opacity overlying the heart, not confirmed on frontal view.  Heart is upper normal in size with pulmonary vascularity within normal limits. No adenopathy. No bone lesions.  IMPRESSION: 9 x 7 mm nodular opacity on the lateral view overlying the heart. This finding cannot be confirmed on the frontal view. Given this finding, and noncontrast enhanced chest CT is advised to further assess.  Bullous disease right apex.  No edema or consolidation.   Electronically Signed   By: Bretta Bang III M.D.  On: 03/26/2014 21:48   Ct Chest Wo Contrast  03/28/2014   CLINICAL DATA:  Pulmonary nodule seen on recent chest radiograph.  EXAM: CT CHEST WITHOUT CONTRAST  TECHNIQUE: Multidetector CT imaging of the chest was performed following the standard protocol without IV contrast.  COMPARISON:  Chest radiograph on 03/26/2014  FINDINGS: Mediastinum/Lymph Nodes: No masses or pathologically enlarged lymph nodes identified. Coronary artery calcification incidentally noted.  Lungs/Pleura: In the right mid lung, there are several tiny indeterminate pulmonary nodules measuring 5-6 mm in the perihilar region and peripheral subpleural region on images 29, 39, and 45 or of series 205. No evidence of pulmonary airspace disease or pleural effusion. Mild subpleural bullous disease noted in both lung apices.   Musculoskeletal/Soft Tissues: No suspicious bone lesions or other significant chest wall abnormality.  Upper Abdomen:  Unremarkable.  IMPRESSION: Several indeterminate right lung nodules measuring up to 6 mm. If the patient is at high risk for bronchogenic carcinoma, follow-up chest CT at 6-12 months is recommended. If the patient is at low risk for bronchogenic carcinoma, follow-up chest CT at 12 months is recommended. This recommendation follows the consensus statement: Guidelines for Management of Small Pulmonary Nodules Detected on CT Scans: A Statement from the Fleischner Society as published in Radiology 2005;237:395-400.   Electronically Signed   By: Myles RosenthalJohn  Stahl M.D.   On: 03/28/2014 07:59   ASSESSMENT AND PLAN  49 year old smoker admitted with anterior STEMI  1. CAD, STEMI, with occluded mid LAD treated with aspiration thrombectomy and placement of overlapping DES with a 2.5 x 38 Synergy drug-eluting stent and a 3.0 x 32 drug-eluting stent to cover a long area of disease. - continue ASA 81 mg po daily, Brillinta for at least a year, atorvastatin 80 mg po daily  2. Ischemic cardiomyopathy - LVEF severely reduced 25-50%, akinesis of the distal anteroseptal and apical myocardium; overall, severely reduced LV function; possible apical thrombus noted using definiity; Q waves in the anterior leads, he might not recover from this as he came almost 24 hours after pain started. - continue lisinopril 2.5 mg po daily - echo images with Definity suspicious for apical thrombus, continue heparin while loading coumadin. He really wants to go home but he does not have insurance so Lovenox injections will be too costly on self pay so will have to continue IV Heparin bridge in hospital.  INR has not bumped yet.  Pharmacy dosing coumadin.    3. Hypertension - controlled on metoprolol  4. Lung nodule - incidentally found on lung CT, as a smoker he is a high risk, he will need follow up CT in 6  months.  5. Smoking cessation - Chantix started - there is increased risk of CV events, however still lower than if he continues to smoke  6. nsVT - reperfusion, on metoprolol with no reoccurence    Quintella ReichertURNER,Melonie Germani R, MD  03/30/2014  11:59 AM

## 2014-03-31 LAB — PROTIME-INR
INR: 1.36 (ref 0.00–1.49)
Prothrombin Time: 16.9 seconds — ABNORMAL HIGH (ref 11.6–15.2)

## 2014-03-31 LAB — CBC
HCT: 41.4 % (ref 39.0–52.0)
Hemoglobin: 14.1 g/dL (ref 13.0–17.0)
MCH: 31.8 pg (ref 26.0–34.0)
MCHC: 34.1 g/dL (ref 30.0–36.0)
MCV: 93.5 fL (ref 78.0–100.0)
Platelets: 171 10*3/uL (ref 150–400)
RBC: 4.43 MIL/uL (ref 4.22–5.81)
RDW: 14.2 % (ref 11.5–15.5)
WBC: 4.4 10*3/uL (ref 4.0–10.5)

## 2014-03-31 LAB — HEPARIN LEVEL (UNFRACTIONATED): Heparin Unfractionated: 0.59 IU/mL (ref 0.30–0.70)

## 2014-03-31 MED ORDER — CARVEDILOL 3.125 MG PO TABS
3.1250 mg | ORAL_TABLET | Freq: Two times a day (BID) | ORAL | Status: DC
  Administered 2014-03-31 – 2014-04-01 (×2): 3.125 mg via ORAL
  Filled 2014-03-31 (×3): qty 1

## 2014-03-31 MED ORDER — LISINOPRIL 2.5 MG PO TABS
2.5000 mg | ORAL_TABLET | Freq: Every day | ORAL | Status: DC
  Administered 2014-03-31: 2.5 mg via ORAL
  Filled 2014-03-31 (×2): qty 1

## 2014-03-31 MED ORDER — WARFARIN SODIUM 2.5 MG PO TABS
12.5000 mg | ORAL_TABLET | Freq: Once | ORAL | Status: AC
  Administered 2014-03-31: 12.5 mg via ORAL
  Filled 2014-03-31 (×2): qty 1

## 2014-03-31 NOTE — Progress Notes (Signed)
    Subjective:  Feels well this morning. No chest pain or dyspnea.  Objective:  Vital Signs in the last 24 hours: Temp:  [98.1 F (36.7 C)-98.4 F (36.9 C)] 98.4 F (36.9 C) (03/21 0540) Pulse Rate:  [69-71] 69 (03/21 0540) Resp:  [18-20] 18 (03/21 0540) BP: (116-128)/(72-99) 117/80 mmHg (03/21 0540) SpO2:  [99 %-100 %] 99 % (03/21 0540) Weight:  [216 lb 3.2 oz (98.068 kg)] 216 lb 3.2 oz (98.068 kg) (03/21 0540)  Intake/Output from previous day: 03/20 0701 - 03/21 0700 In: 900 [P.O.:720; I.V.:180] Out: 300 [Urine:300]  Physical Exam: Pt is alert and oriented, NAD HEENT: normal Neck: JVP - normal, carotids 2+= without bruits Lungs: CTA bilaterally CV: RRR without murmur or gallop Abd: soft, NT, Positive BS, no hepatomegaly Ext: no C/C/E, distal pulses intact and equal Skin: warm/dry no rash   Lab Results:  Recent Labs  03/30/14 0406 03/31/14 0447  WBC 5.7 4.4  HGB 14.5 14.1  PLT 184 171   No results for input(s): NA, K, CL, CO2, GLUCOSE, BUN, CREATININE in the last 72 hours. No results for input(s): TROPONINI in the last 72 hours.  Invalid input(s): CK, MB  Cardiac Studies: 2-D echocardiogram: Study Conclusions  - Procedure narrative: Transthoracic echocardiography. Image quality was adequate. The study was technically difficult. Intravenous contrast (Definity) was administered. - Left ventricle: The cavity size was normal. Wall thickness was normal. Systolic function was severely reduced. The estimated ejection fraction was in the range of 25% to 30%. There is akinesis of the mid-apicalanteroseptal and apical myocardium. Doppler parameters are consistent with abnormal left ventricular relaxation (grade 1 diastolic dysfunction). - Aortic valve: There was trivial regurgitation. - Mitral valve: There was mild regurgitation.  Impressions:  - Akinesis of the distal anteroseptal and apical myocardium; overall, severely reduced LV function;  possible apical thrombus noted usind definiity; grade 1 diastolic dysfunction; mild MR.  Tele: Personally reviewed. Sinus rhythm with one short run of nonsustained VT.  Assessment/Plan:  1. Anterior wall MI: Patient treated with primary PCI using overlapping DES. He should remain on dual antiplatelet therapy with aspirin and brilinta for greater than 12 months.  2. Ischemic cardiomyopathy: Severe LV dysfunction noted. This is related to his acute infarct. LVEF less than 30%. Medications reviewed. I'm going to change metoprolol to carvedilol 3.125 mg twice daily. Will also add lisinopril 2.5 mg daily.  In addition, we had a lengthy discussion about risk of sudden cardiac death. Considering anterior wall MI and severe LV dysfunction, I think a LifeVest is indicated. The patient is concerned about his lack of insurance. Will explore options. He understands the rationale for this and that we would reassess LV function in 3 months to decide on need for an ICD.  3. LV apical thrombus. Patient on warfarin/heparin. INR trending up. Will reassess tomorrow morning.  4. Tobacco abuse: Cessation counseling done.  5. Hyperlipidemia: On high intensity statin drug.  Tonny BollmanMichael Coen Miyasato, M.D. 03/31/2014, 9:58 AM

## 2014-03-31 NOTE — Progress Notes (Addendum)
CARDIAC REHAB PHASE I   PRE:  Rate/Rhythm: 79 SR    BP: sitting 100/74    SaO2:   MODE:  Ambulation: 550 ft   POST:  Rate/Rhythm: 85 SR    BP: sitting 115/70     SaO2:   Tolerated very well. Has been walking independently 4 times a day, no problems. After talking with Dr. Excell Seltzerooper pt frustrated bc he sts this is the first he has heard that his heart function is 25 %. Tried to help pt understand everything. Left HF booklet for him to read and will bring low sodium diet sheets. Also discussed financial assistance and apparently financial counselor is coming to talk with him today. Pt sent all of his information home and doesn't remember much from Charlene's 1 1/2 hr conversation with him on Friday. Will f/u tomorrow to help answer his questions. [phone]   Elissa LovettReeve, Arrow Tomko SlocombKristan CES, ACSM 03/31/2014 9:50 AM

## 2014-03-31 NOTE — Progress Notes (Signed)
ANTICOAGULATION CONSULT NOTE - Follow Up Consult  Pharmacy Consult for Heparin/Coumadin Indication: LV thrombus  No Known Allergies  Patient Measurements: Height: 6\' 1"  (185.4 cm) Weight: 216 lb 3.2 oz (98.068 kg) IBW/kg (Calculated) : 79.9 Heparin Dosing Weight: 97 kg  Vital Signs: Temp: 98.4 F (36.9 C) (03/21 0540) Temp Source: Oral (03/21 0540) BP: 117/80 mmHg (03/21 0540) Pulse Rate: 69 (03/21 0540)  Labs:  Recent Labs  03/29/14 0426  03/29/14 0500 03/29/14 1010 03/30/14 0406 03/31/14 0447  HGB  --   < > 14.5  --  14.5 14.1  HCT  --   --  42.1  --  42.3 41.4  PLT  --   --  183  --  184 171  LABPROT 14.0  --   --   --  14.2 16.9*  INR 1.07  --   --   --  1.09 1.36  HEPARINUNFRC 0.37  --   --  0.47 0.48 0.59  < > = values in this interval not displayed.  Estimated Creatinine Clearance: 105.3 mL/min (by C-G formula based on Cr of 1.07).  Assessment: 49 year old male admitted with NSTEMI found with possible LV thrombus on ECHO on IV heparin and coumadin bridge day #4.   INR today is sub-therapeutic at 1.36 (INR slightly up from 1.09). Heparin level this AM is therapeutic at 0.59 on 1500 units/hr.  Coumadin dose was increased 03/30/14.  Goal of Therapy:  INR 2-3 Heparin level 0.3-0.7 units/ml Monitor platelets by anticoagulation protocol: Yes   Plan:  -Coumadin to 12.5mg  po x1 tonight.  -Daily PT/INR while on therapy.  -Continue Heparin at 1500 units/hr. -Daily heparin level with CBC.  Harland GermanAndrew Zeppelin Beckstrand, Pharm D 03/31/2014 10:02 AM

## 2014-04-01 LAB — HEPARIN LEVEL (UNFRACTIONATED): HEPARIN UNFRACTIONATED: 0.56 [IU]/mL (ref 0.30–0.70)

## 2014-04-01 LAB — PROTIME-INR
INR: 1.6 — AB (ref 0.00–1.49)
Prothrombin Time: 19.2 seconds — ABNORMAL HIGH (ref 11.6–15.2)

## 2014-04-01 LAB — CBC
HCT: 41.8 % (ref 39.0–52.0)
Hemoglobin: 13.8 g/dL (ref 13.0–17.0)
MCH: 30.5 pg (ref 26.0–34.0)
MCHC: 33 g/dL (ref 30.0–36.0)
MCV: 92.5 fL (ref 78.0–100.0)
Platelets: 192 10*3/uL (ref 150–400)
RBC: 4.52 MIL/uL (ref 4.22–5.81)
RDW: 14.1 % (ref 11.5–15.5)
WBC: 4.9 10*3/uL (ref 4.0–10.5)

## 2014-04-01 MED ORDER — CARVEDILOL 6.25 MG PO TABS
6.2500 mg | ORAL_TABLET | Freq: Two times a day (BID) | ORAL | Status: DC
  Administered 2014-04-01 – 2014-04-02 (×2): 6.25 mg via ORAL
  Filled 2014-04-01 (×2): qty 1

## 2014-04-01 MED ORDER — WARFARIN SODIUM 7.5 MG PO TABS
15.0000 mg | ORAL_TABLET | Freq: Once | ORAL | Status: AC
  Administered 2014-04-01: 15 mg via ORAL
  Filled 2014-04-01: qty 2

## 2014-04-01 MED ORDER — LISINOPRIL 5 MG PO TABS
5.0000 mg | ORAL_TABLET | Freq: Every day | ORAL | Status: DC
  Administered 2014-04-01 – 2014-04-02 (×2): 5 mg via ORAL
  Filled 2014-04-01 (×2): qty 1

## 2014-04-01 NOTE — Progress Notes (Signed)
ANTICOAGULATION CONSULT NOTE - Follow Up Consult  Pharmacy Consult for Heparin/Coumadin Indication: LV thrombus  No Known Allergies  Patient Measurements: Height: 6\' 1"  (185.4 cm) Weight: 213 lb 11.2 oz (96.934 kg) IBW/kg (Calculated) : 79.9 Heparin Dosing Weight: 97 kg  Vital Signs: Temp: 97.7 F (36.5 C) (03/22 0500) Temp Source: Oral (03/22 0500) BP: 133/88 mmHg (03/22 0500) Pulse Rate: 80 (03/22 0500)  Labs:  Recent Labs  03/30/14 0406 03/31/14 0447 04/01/14 0448  HGB 14.5 14.1 13.8  HCT 42.3 41.4 41.8  PLT 184 171 192  LABPROT 14.2 16.9* 19.2*  INR 1.09 1.36 1.60*  HEPARINUNFRC 0.48 0.59 0.56    Estimated Creatinine Clearance: 104.7 mL/min (by C-G formula based on Cr of 1.07).  Assessment: 49 year old male admitted with NSTEMI found with possible LV thrombus on ECHO on IV heparin and coumadin bridge day #5.   INR today is sub-therapeutic at 1.6 (INR  up from 1.36). Heparin level this AM is therapeutic at 0.56 on 1500 units/hr.  Coumadin dose was increased 03/30/14.  Goal of Therapy:  INR 2-3 Heparin level 0.3-0.7 units/ml Monitor platelets by anticoagulation protocol: Yes   Plan:  -Coumadin to 15mg  po x1 tonight.  -Daily PT/INR while on therapy.  -Continue Heparin at 1500 units/hr. -Daily heparin level with CBC.  Harland GermanAndrew Viveca Beckstrom, Pharm D 04/01/2014 8:41 AM

## 2014-04-01 NOTE — Progress Notes (Signed)
Pt walking independently. Answered all of pts questions. Gave videos to watch today. Pt feeling much better, beginning to process all of the information he has been given.  [id number] Thomas ChickKristan Paiden Woods CES, ACSM 10:37 AM 04/01/2014

## 2014-04-01 NOTE — Progress Notes (Signed)
Subjective:  Feels well this morning. He has a number of questions but is satisfied with his care. Very anxious to go home but understands we are working with his INR.   Objective:  Vital Signs in the last 24 hours: Temp:  [97.7 F (36.5 C)-98.3 F (36.8 C)] 97.7 F (36.5 C) (03/22 0500) Pulse Rate:  [71-80] 80 (03/22 0500) Resp:  [18-20] 18 (03/22 0500) BP: (114-133)/(73-88) 133/88 mmHg (03/22 0500) SpO2:  [100 %] 100 % (03/22 0500) Weight:  [213 lb 11.2 oz (96.934 kg)] 213 lb 11.2 oz (96.934 kg) (03/22 0500)  Intake/Output from previous day: 03/21 0701 - 03/22 0700 In: 1537.3 [P.O.:1210; I.V.:327.3] Out: 700 [Urine:700]  Physical Exam: Pt is alert and oriented, NAD HEENT: normal Neck: JVP - normal, carotids 2+= without bruits Lungs: CTA bilaterally CV: RRR without murmur or gallop Abd: soft, NT, Positive BS, no hepatomegaly Ext: no C/C/E, distal pulses intact and equal Skin: warm/dry no rash   Lab Results:  Recent Labs  03/31/14 0447 04/01/14 0448  WBC 4.4 4.9  HGB 14.1 13.8  PLT 171 192   No results for input(s): NA, K, CL, CO2, GLUCOSE, BUN, CREATININE in the last 72 hours. No results for input(s): TROPONINI in the last 72 hours.  Invalid input(s): CK, MB  Cardiac Studies: 2-D echocardiogram: Study Conclusions  - Procedure narrative: Transthoracic echocardiography. Image quality was adequate. The study was technically difficult. Intravenous contrast (Definity) was administered. - Left ventricle: The cavity size was normal. Wall thickness was normal. Systolic function was severely reduced. The estimated ejection fraction was in the range of 25% to 30%. There is akinesis of the mid-apicalanteroseptal and apical myocardium. Doppler parameters are consistent with abnormal left ventricular relaxation (grade 1 diastolic dysfunction). - Aortic valve: There was trivial regurgitation. - Mitral valve: There was mild  regurgitation.  Impressions:  - Akinesis of the distal anteroseptal and apical myocardium; overall, severely reduced LV function; possible apical thrombus noted usind definiity; grade 1 diastolic dysfunction; mild MR.  Tele: Personally reviewed. Sinus rhythm with one short run of nonsustained VT.  Assessment/Plan:   Thomas Woods is a 49 y.o. male with a history of tobacco abuse, HTN and no prior cardiac diseased who was initially admitted on 03/27/14 for NSTEMI. This evolved into a code STEMI due to an acute anterior wall MI and he was taken back for emergent cardiac catheterization.   1. Anterior wall MI: Patient treated with primary PCI using overlapping DES. He should remain on dual antiplatelet therapy with aspirin and brilinta for greater than 12 months. -- Continue ASA/Brilinta, statin and BB.   2. Ischemic cardiomyopathy: Severe LV dysfunction noted. This is related to his acute infarct. LVEF less than 30%. Medications reviewed. Metoprolol changed to carvedilol 3.125 mg BID yesterday. Lisinopril 2.5 mg daily added  -- Considering anterior wall MI and severe LV dysfunction, LifeVest felt to be indicated. This has been ordered and hopefully will be fitted today. -- Will need to reassess LV function in 3 months to decide on need for an ICD.  3. LV apical thrombus. Patient on warfarin/heparin. INR trending up. 1. 6 today. Waiting on therapeutic INR -- Continue ASA, brilinta and coumadin for now. Can likely D/C ASA when coumadin theraputic.  4. Tobacco abuse: Cessation counseling done.  5. Hyperlipidemia: On high intensity statin drug.   Thomas Woods, M.D. 04/01/2014, 8:12 AM  Patient seen and examined and history reviewed. Agree with above findings and plan. Doing well from our standpoint.  I think we can titrate Coreg and lisinopril up today. BP stable. HR 70s. Will need to be fitted for Life Vest. Plan DC home when INR >2.0. Will need to decide whether to continue ASA at DC  since he is on coumadin. ? Brilinta alone or ASA for one or 12 months.   Thomas Woods, MDFACC 04/01/2014 10:17 AM

## 2014-04-02 ENCOUNTER — Telehealth: Payer: Self-pay | Admitting: Cardiology

## 2014-04-02 ENCOUNTER — Encounter (HOSPITAL_COMMUNITY): Payer: Self-pay | Admitting: Physician Assistant

## 2014-04-02 LAB — CBC
HCT: 43 % (ref 39.0–52.0)
Hemoglobin: 14.6 g/dL (ref 13.0–17.0)
MCH: 31.2 pg (ref 26.0–34.0)
MCHC: 34 g/dL (ref 30.0–36.0)
MCV: 91.9 fL (ref 78.0–100.0)
Platelets: 201 10*3/uL (ref 150–400)
RBC: 4.68 MIL/uL (ref 4.22–5.81)
RDW: 14.1 % (ref 11.5–15.5)
WBC: 6 10*3/uL (ref 4.0–10.5)

## 2014-04-02 LAB — HEPARIN LEVEL (UNFRACTIONATED): HEPARIN UNFRACTIONATED: 0.57 [IU]/mL (ref 0.30–0.70)

## 2014-04-02 LAB — PROTIME-INR
INR: 1.96 — ABNORMAL HIGH (ref 0.00–1.49)
Prothrombin Time: 22.5 seconds — ABNORMAL HIGH (ref 11.6–15.2)

## 2014-04-02 MED ORDER — ATORVASTATIN CALCIUM 80 MG PO TABS: 80.0000 mg | ORAL_TABLET | Freq: Every day | ORAL | Status: DC

## 2014-04-02 MED ORDER — NICOTINE 14 MG/24HR TD PT24: 14.0000 mg | MEDICATED_PATCH | Freq: Every day | TRANSDERMAL | Status: DC

## 2014-04-02 MED ORDER — WARFARIN SODIUM 2.5 MG PO TABS: 12.5000 mg | ORAL_TABLET | Freq: Once | ORAL | Status: DC

## 2014-04-02 MED ORDER — PANTOPRAZOLE SODIUM 40 MG PO TBEC: 40.0000 mg | DELAYED_RELEASE_TABLET | Freq: Every day | ORAL | Status: DC

## 2014-04-02 MED ORDER — TICAGRELOR 90 MG PO TABS: 90.0000 mg | ORAL_TABLET | Freq: Two times a day (BID) | ORAL | Status: DC

## 2014-04-02 MED ORDER — VARENICLINE TARTRATE 0.5 MG X 11 & 1 MG X 42 PO MISC: ORAL | Status: DC

## 2014-04-02 MED ORDER — SIMVASTATIN 40 MG PO TABS: 40.0000 mg | ORAL_TABLET | Freq: Every day | ORAL | Status: DC

## 2014-04-02 MED ORDER — LISINOPRIL 5 MG PO TABS: 5.0000 mg | ORAL_TABLET | Freq: Every day | ORAL | Status: DC

## 2014-04-02 MED ORDER — CARVEDILOL 6.25 MG PO TABS: 6.2500 mg | ORAL_TABLET | Freq: Two times a day (BID) | ORAL | Status: DC

## 2014-04-02 MED ORDER — ASPIRIN 81 MG PO TABS: 81.0000 mg | ORAL_TABLET | Freq: Every day | ORAL | Status: DC

## 2014-04-02 MED ORDER — WARFARIN SODIUM 5 MG PO TABS: 5.0000 mg | ORAL_TABLET | Freq: Every day | ORAL | Status: DC

## 2014-04-02 MED ORDER — NITROGLYCERIN 0.4 MG SL SUBL: 0.4000 mg | SUBLINGUAL_TABLET | SUBLINGUAL | Status: DC | PRN

## 2014-04-02 NOTE — Telephone Encounter (Signed)
New message ° ° ° ° ° °TCM appt on 04-04-14 per Rhonda °

## 2014-04-02 NOTE — Discharge Instructions (Signed)
Repeat CT scan of the chest in 6 months to follow up nodules.  PLEASE REMEMBER TO BRING ALL OF YOUR MEDICATIONS TO EACH OF YOUR FOLLOW-UP OFFICE VISITS.  PLEASE ATTEND ALL SCHEDULED FOLLOW-UP APPOINTMENTS.   Activity: Increase activity slowly as tolerated. You may shower, but no soaking baths (or swimming) for 1 week. No driving for 1 week. No lifting over 5 lbs for 2 weeks. No sexual activity for 1 week.   You May Return to Work: in 3 weeks (if applicable)  Wound Care: You may wash cath site gently with soap and water. Keep cath site clean and dry. If you notice pain, swelling, bleeding or pus at your cath site, please call [phone].    Cardiac Cath Site Care Refer to this sheet in the next few weeks. These instructions provide you with information on caring for yourself after your procedure. Your caregiver may also give you more specific instructions. Your treatment has been planned according to current medical practices, but problems sometimes occur. Call your caregiver if you have any problems or questions after your procedure. HOME CARE INSTRUCTIONS  You may shower 24 hours after the procedure. Remove the bandage (dressing) and gently wash the site with plain soap and water. Gently pat the site dry.   Do not apply powder or lotion to the site.   Do not sit in a bathtub, swimming pool, or whirlpool for 5 to 7 days.   No bending, squatting, or lifting anything over 10 pounds (4.5 kg) as directed by your caregiver.   Inspect the site at least twice daily.   Do not drive home if you are discharged the same day of the procedure. Have someone else drive you.   You may drive 24 hours after the procedure unless otherwise instructed by your caregiver.  What to expect:  Any bruising will usually fade within 1 to 2 weeks.   Blood that collects in the tissue (hematoma) may be painful to the touch. It should usually decrease in size and tenderness within 1 to 2 weeks.  SEEK IMMEDIATE  MEDICAL CARE IF:  You have unusual pain at the site or down the affected limb.   You have redness, warmth, swelling, or pain at the site.   You have drainage (other than a small amount of blood on the dressing).   You have chills.   You have a fever or persistent symptoms for more than 72 hours.   You have a fever and your symptoms suddenly get worse.   Your leg becomes pale, cool, tingly, or numb.   You have heavy bleeding from the site. Hold pressure on the site.  Document Released: 01/29/2010 Document Revised: 12/16/2010 Document Reviewed:

## 2014-04-02 NOTE — Progress Notes (Signed)
Patient Name: Thomas Woods Date of Encounter: 04/02/2014  Principal Problem:   Acute anterior wall MI Active Problems:   Tobacco abuse   NSTEMI (non-ST elevated myocardial infarction)   LV (left ventricular) mural thrombus   Primary Cardiologist: Dr. Eldridge Dace  Patient Profile: 49 y.o. male with a history of tobacco abuse, HTN and no prior cardiac disease, initially admitted on 03/27/14 for NSTEMI. This evolved into a code STEMI due to an acute anterior wall MI and he had emergent cardiac catheterization.   SUBJECTIVE: No chest pain or SOB, willing to do what he needs to do to get better.  OBJECTIVE Filed Vitals:   04/01/14 0500 04/01/14 1348 04/01/14 2035 04/02/14 0350  BP: 133/88 125/76 127/78 133/87  Pulse: 80 77 71 81  Temp: 97.7 F (36.5 C) 98.5 F (36.9 C) 98.4 F (36.9 C) 97.9 F (36.6 C)  TempSrc: Oral Oral Oral Oral  Resp: Height:      Weight: 213 lb 11.2 oz (96.934 kg)   217 lb 9.6 oz (98.703 kg)  SpO2: 100% 100% 100% 98%    Intake/Output Summary (Last 24 hours) at 04/02/14 0904 Last data filed at 04/02/14 0855  Gross per 24 hour  Intake    500 ml  Output      0 ml  Net    500 ml   Filed Weights   03/31/14 0540 04/01/14 0500 04/02/14 0350  Weight: 216 lb 3.2 oz (98.068 kg) 213 lb 11.2 oz (96.934 kg) 217 lb 9.6 oz (98.703 kg)    PHYSICAL EXAM General: Well developed, well nourished, male in no acute distress. Head: Normocephalic, atraumatic.  Neck: Supple without bruits, JVD not elevated. Lungs:  Resp regular and unlabored, CTA. Heart: RRR, S1, S2, no S3, S4, or murmur; no rub. Abdomen: Soft, non-tender, non-distended, BS + x 4.  Extremities: No clubbing, cyanosis, no edema.  Neuro: Alert and oriented X 3. Moves all extremities spontaneously. Psych: Normal affect.  LABS: CBC:  Recent Labs  04/01/14 0448 04/02/14 0356  WBC 4.9 6.0  HGB 13.8 14.6  HCT 41.8 43.0  MCV 92.5 91.9  PLT 192 201   INR:  Recent Labs   04/01/14 0448  INR 1.60*    TELE:  SR, 3 bt VT > 24 hr ago  Current Medications:  . angioplasty book   Does not apply Once  . aspirin  81 mg Oral Daily  . atorvastatin  80 mg Oral q1800  . carvedilol  6.25 mg Oral BID WC  . lisinopril  5 mg Oral Daily  . multivitamin with minerals  1 tablet Oral Daily  . nicotine  14 mg Transdermal Daily  . pantoprazole  40 mg Oral Daily  . sodium chloride  3 mL Intravenous Q12H  . ticagrelor  90 mg Oral BID  . varenicline  0.5 mg Oral BID   Followed by  . [START ON 04/04/2014] varenicline  1 mg Oral BID  . warfarin   Does not apply Once  . Warfarin - Pharmacist Dosing Inpatient   Does not apply q1800   . heparin 1,500 Units/hr (04/01/14 2141)    ASSESSMENT AND PLAN: Kinte Trim is a 49 y.o. male with a history of tobacco abuse, HTN and no prior cardiac diseased who was initially admitted on 03/27/14 for NSTEMI. This evolved into a code STEMI due to an acute anterior wall MI and he had emergent cardiac catheterization.   1. Anterior wall MI:  -  Patient treated with primary PCI using overlapping DES LAD.  - Continue dual antiplatelet therapy with aspirin and brilinta for greater than 12 months. - Continue statin and BB.   2. Ischemic cardiomyopathy:  - Severe LV dysfunction noted,related to his acute infarct. LVEF less than 30%.  - Medications reviewed. Metoprolol changed to carvedilol 3.125 mg BID 03/21.  - Lisinopril 2.5 mg daily added 03/21 - Considering anterior wall MI and severe LV dysfunction, LifeVest felt to be indicated. This has been ordered and hopefully will be fitted today. - Will need to reassess LV function in 3 months to decide on need for an ICD.  3. LV apical thrombus.  - Patient on warfarin/heparin.  - INR trending up. 1. 6 03/22. Waiting on therapeutic INR - Continue ASA, brilinta and coumadin for now. ? D/C ASA when coumadin theraputic.  4. Tobacco abuse: Cessation counseling done.  5. Hyperlipidemia: On high  intensity statin drug.   Plan: d/c when medically stable, possibly today  Signed, Theodore DemarkRhonda Barrett , PA-C 9:04 AM 04/02/2014  Patient seen, examined. Available data reviewed. Agree with findings, assessment, and plan as outlined by Theodore Demarkhonda Barrett, PA-C. Patient independently interviewed and examined. He is alert, oriented, in no distress. Lungs are clear. Heart is regular rate and rhythm with no murmur or gallop. There is no peripheral edema. He seems to be progressing well after his large anterior wall MI. He is tolerating low-dose medical therapy. INR is trending up and we are awaiting today's lab value. As long as he is approaching a therapeutic INR, he will likely be discharged later today. He will need a LifeVest at discharge. We have discussed this extensively and a consultation has been placed. Once his INR is therapeutic, I am inclined to discharge him on triple therapy with ASA, brilinta and warfarin for one month, then will discontinue aspirin. Warfarin should be limited to 3 months, then can resume DAPT with ASA 81 mg and warfarin. Will change atorvastatin to simvastatin 40 mg to get him on a $4 medicine.  Tonny BollmanMichael Krishana Lutze, M.D. 04/02/2014 9:08 AM

## 2014-04-02 NOTE — Progress Notes (Signed)
Pt discharged home with family.  Reviewed discharge instructions and education, all questions answered.  Assessment unchanged from earlier.  

## 2014-04-02 NOTE — Progress Notes (Signed)
ANTICOAGULATION CONSULT NOTE - Follow Up Consult  Pharmacy Consult for Heparin/Coumadin Indication: LV thrombus  No Known Allergies  Patient Measurements: Height: 6\' 1"  (185.4 cm) Weight: 217 lb 9.6 oz (98.703 kg) IBW/kg (Calculated) : 79.9 Heparin Dosing Weight: 97 kg  Vital Signs: Temp: 97.9 F (36.6 C) (03/23 0350) Temp Source: Oral (03/23 0350) BP: 133/87 mmHg (03/23 0350) Pulse Rate: 81 (03/23 0350)  Labs:  Recent Labs  03/31/14 0447 04/01/14 0448 04/02/14 0356 04/02/14 0750  HGB 14.1 13.8 14.6  --   HCT 41.4 41.8 43.0  --   PLT 171 192 201  --   LABPROT 16.9* 19.2*  --  22.5*  INR 1.36 1.60*  --  1.96*  HEPARINUNFRC 0.59 0.56  --  0.57    Estimated Creatinine Clearance: 105.5 mL/min (by C-G formula based on Cr of 1.07).  Assessment: 49 year old male admitted with NSTEMI found with possible LV thrombus on ECHO on IV heparin and coumadin bridge day #6.   INR today is sub-therapeutic at 1.96 (INR  up from 1.6). Heparin level this AM is therapeutic at 0.57 on 1500 units/hr.    Goal of Therapy:  INR 2-3 Heparin level 0.3-0.7 units/ml Monitor platelets by anticoagulation protocol: Yes   Plan:  -Coumadin to 12.5mg  po x1 tonight -When ready for discharge could consider a regimen of coumadin 12.5mg  po daily -Daily PT/INR while on therapy.  -Continue Heparin at 1500 units/hr. -Daily heparin level with CBC.  Harland GermanAndrew Meleena Munroe, Pharm D 04/02/2014 10:26 AM

## 2014-04-02 NOTE — Discharge Summary (Signed)
CARDIOLOGY DISCHARGE SUMMARY   Patient ID: Thomas Woods MRN: [id number] DOB/AGE: [birth year] 48 y.o.  Admit date: 03/26/2014 Discharge date: 04/02/2014  PCP: No primary care provider on file. Primary Cardiologist: Dr. Eldridge Dace  Primary Discharge Diagnosis:    Acute anterior wall MI -s/p overlapping DES to the LAD Secondary Discharge Diagnosis:    Tobacco abuse   NSTEMI (non-ST elevated myocardial infarction)   LV (left ventricular) mural thrombus   Nonsustained VT   Pulmonary nodules - seen on CXR/CT 03/2014, recheck in 6-12 months   Ischemic cardiomyopathy - EF 30% echo, Life Vest ordered  Procedures: 2-D echocardiogram, CT of the chest without contrast, cardiac catheterization, coronary arteriogram, left ventriculogram, aspiration thrombectomy of the LAD, PCI of the LAD  Hospital Course: Thomas Woods is a 49 y.o. male with no history of CAD. He had a couple of elevated blood pressures at M.D. visits, but believed that his blood pressure was generally normal. History of tobacco use and no other cardiac risk factors, but does not get frequent medical care. He developed chest pain on the day of admission and came to the hospital where his initial troponin was elevated and he was admitted for further evaluation and treatment.  His symptoms improved on medical therapy including aspirin, heparin, and nitrates.  On 03/27/2014, he developed increasing chest pain and had nonsustained VT. His ECG was rechecked and was significantly abnormal. A code STEMI was called and he was taken emergently to the cath lab.  Cardiac catheterization results are below. His mid LAD was occluded and this was treated with overt lapping drug-eluting stents. His EF was 30%.  A lipid profile is shown below. He was started on high-dose statin. He was started on a beta blocker and an ACE inhibitor. He also had a nicotine patch applied. He requested medication to help with smoking cessation and was started on Chantix. He  was counseled on smoking cessation and is motivated to quit.  He was seen by cardiac rehabilitation and educated on MI restrictions, heart-healthy lifestyle modifications and exercise guidelines. He is to get Brilinta for free for 30 days, and also is applying for Medicaid. He can be changed to Plavix for cost savings if he is unable to afford the Brilinta.  A 2-D echocardiogram was performed and confirmed his EF at 30%. He was also noted to have of mural thrombus. He was on heparin and started on Coumadin for this. A LifeVest was ordered and he will be fitted for this prior to discharge. He is to continue aspirin, Brilinta and warfarin for 1 month, then Coumadin and Brilinta alone for 3 months. He will get a recheck of his EF at 3 months and if no mural thrombus is seen, discontinued Coumadin. Once Coumadin is discontinued, he should continue aspirin at 81 mg and Brilinta.  He was initially on Lipitor at 80 mg daily, but was to change to Zocor 40 mg daily at discharge. However, he will be able to obtain the Lipitor 80 mg for free through the [facility] and so will continue the Lipitor 80 mg daily.  On his initial chest x-ray, pulmonary nodules were noted. A CT scan of the chest without contrast was performed to further evaluate these. The nodules were of indeterminate size, and a repeat CT in 6-12 months is recommended.  Labs:  Lab Results  Component Value Date   WBC 6.0 04/02/2014   HGB 14.6 04/02/2014   HCT 43.0 04/02/2014   MCV 91.9 04/02/2014  PLT 201 04/02/2014     Recent Labs Lab 03/28/14 0303  NA 137  K 4.3  CL 103  CO2 28  BUN 7  CREATININE 1.07  CALCIUM 9.5  PROT 6.7  BILITOT 1.0  ALKPHOS 39  ALT 23  AST 94*  GLUCOSE 96   TROPONIN I  Date Value Ref Range Status  03/27/2014 1.41* <0.031 ng/mL Final  03/27/2014 0.93* <0.031 ng/mL Final  03/26/2014 0.33* <0.031 ng/mL Final  03/26/2014 0.16* <0.031 ng/mL Final   Lipid Panel     Component Value  Date/Time   CHOL 210* 03/27/2014 0833   TRIG 62 03/27/2014 0833   HDL 64 03/27/2014 0833   CHOLHDL 3.3 03/27/2014 0833   VLDL 12 03/27/2014 0833   LDLCALC 134* 03/27/2014 0833    B NATRIURETIC PEPTIDE  Date/Time Value Ref Range Status  03/26/2014 08:18 PM 48.4 0.0 - 100.0 pg/mL Final    Recent Labs  04/02/14 0750  INR 1.96*      Radiology: Dg Chest 2 View 03/26/2014   CLINICAL DATA:  One day history of chest pain and hypertension  EXAM: CHEST  2 VIEW  COMPARISON:  None.  FINDINGS: There is bullous disease in the right apex. There is no edema or consolidation. On the lateral view, there is a 9 x 7 mm nodular opacity overlying the heart, not confirmed on frontal view.  Heart is upper normal in size with pulmonary vascularity within normal limits. No adenopathy. No bone lesions.  IMPRESSION: 9 x 7 mm nodular opacity on the lateral view overlying the heart. This finding cannot be confirmed on the frontal view. Given this finding, and noncontrast enhanced chest CT is advised to further assess.  Bullous disease right apex.  No edema or consolidation.   Electronically Signed   By: Bretta Bang III M.D.   On: 03/26/2014 21:48   Ct Chest Wo Contrast 03/28/2014   CLINICAL DATA:  Pulmonary nodule seen on recent chest radiograph.  EXAM: CT CHEST WITHOUT CONTRAST  TECHNIQUE: Multidetector CT imaging of the chest was performed following the standard protocol without IV contrast.  COMPARISON:  Chest radiograph on 03/26/2014  FINDINGS: Mediastinum/Lymph Nodes: No masses or pathologically enlarged lymph nodes identified. Coronary artery calcification incidentally noted.  Lungs/Pleura: In the right mid lung, there are several tiny indeterminate pulmonary nodules measuring 5-6 mm in the perihilar region and peripheral subpleural region on images 29, 39, and 45 or of series 205. No evidence of pulmonary airspace disease or pleural effusion. Mild subpleural bullous disease noted in both lung apices.   Musculoskeletal/Soft Tissues: No suspicious bone lesions or other significant chest wall abnormality.  Upper Abdomen:  Unremarkable.  IMPRESSION: Several indeterminate right lung nodules measuring up to 6 mm. If the patient is at high risk for bronchogenic carcinoma, follow-up chest CT at 6-12 months is recommended. If the patient is at low risk for bronchogenic carcinoma, follow-up chest CT at 12 months is recommended. This recommendation follows the consensus statement: Guidelines for Management of Small Pulmonary Nodules Detected on CT Scans: A Statement from the Fleischner Society as published in Radiology 2005;237:395-400.   Electronically Signed   By: Myles Rosenthal M.D.   On: 03/28/2014 07:59    Cardiac Cath: 03/27/2014  ANGIOGRAPHIC DATA: The left main coronary artery is widely patent. The left anterior descending artery is occluded in the mid vessel. There is calcification noted under fluoroscopy. After flow was restored, it was noted that there is a long segment of disease in the  LAD. There were several small to medium size diagonal vessels which appeared patent. The left circumflex artery is a large vessel proximally. There is a ramus vessel which is patent there is moderate disease in the mid vessel of his ramus. The first obtuse marginal is a large vessel. There is mild proximal disease. The vessel ranges across the lateral wall and is widely patent. The remainder of the circumflex is small but patent. The right coronary artery is a large, dominant vessel. There is moderate disease in the mid vessel. The posterior lateral artery is large. The posterior descending artery is large and widely patent. PCI NARRATIVE: A CLS 3.0 guiding catheter was used to engage left main. A pro-water wire was placed across the occlusion in the LAD. A fetch catheter was used for aspiration thrombectomy. This restored TIMI 3 flow. A 2.5 x 38 Synergy drug-eluting stent was deployed at high pressure in the more distal  area of stenosis. A 3.0 x 32 Synergy drug-eluting stent was then deployed in overlapping fashion to cover the more proximal area of disease. The stent balloon was used to post dilate the stented segment. This balloon did not go all the way down to the distal portion of the stented segment. The proximal portion of stent was postdilated with a 3.5 x 20 noncompliant balloon. Subsequent, a 3.0 noncompliant balloon was used to post dilate the more distal area of the stented segment. There was an excellent angiographic result. There was no residual stenosis. Intracoronary nitroglycerin was given. The patient also had some spasm in his radial artery and intracoronary nitroglycerin was given and the sheath. LEFT VENTRICULOGRAM: Left ventricular angiogram was done in the 30 RAO projection and revealed normal moderately decreased left ventricular systolic function with an estimated ejection fraction of 30 %. There is severe apical hypokinesis. LVEDP was 16 mmHg. IMPRESSIONS: 1. Normal left main coronary artery. 2. 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 This was successfully treated with aspiration thrombectomy which restored TIMI 3 flow. This was followed by overlapping drug-eluting stent placement with a 2.5 x 38 Synergy drug-eluting stent and a 3.0 x 32 drug-eluting stent to cover a long area of disease. 3. Mild to moderate disease in the left circumflex artery and its branches. 4. Moderate disease in the mid right coronary artery. 5. Moderately decreased left ventricular systolic function.LVEDP 16 mmHg.EF 30%. RECOMMENDATION: Continue aggressive secondary prevention including smoking cessation. He'll need dual antiplatelet therapy for at least a year and likely longer given the other moderate disease that he has. He will be watched in the ICU. I expect he will be in the hospital for at least a day or 2. Hopefully, he will have some recovery of his left  ventricular function now that he has been revascularized.  EKG: 03/28/2014 Sinus rhythm, significant lateral T wave inversions Vent. rate 68 BPM PR interval 150 ms QRS duration 96 ms QT/QTc 460/489 ms P-R-T axes 64 49 118  Echo: 03/27/2014 Conclusions - Procedure narrative: Transthoracic echocardiography. Image quality was adequate. The study was technically difficult. Intravenous contrast (Definity) was administered. - Left ventricle: The cavity size was normal. Wall thickness was normal. Systolic function was severely reduced. The estimated ejection fraction was in the range of 25% to 30%. There is akinesis of the mid-apicalanteroseptal and apical myocardium. Doppler parameters are consistent with abnormal left ventricular relaxation (grade 1 diastolic dysfunction). - Aortic valve: There was trivial regurgitation. - Mitral valve: There was mild regurgitation. Impressions: - Akinesis  of the distal anteroseptal and apical myocardium; overall, severely reduced LV function; possible apical thrombus noted usind definiity; grade 1 diastolic dysfunction; mild MR.  FOLLOW UP PLANS AND APPOINTMENTS No Known Allergies   Medication List    TAKE these medications        aspirin 81 MG tablet  Take 1 tablet (81 mg total) by mouth daily.  Notes to Patient:  Take for 30 days and then stop until off the warfarin. Then resume aspirin 81 mg.     atorvastatin 80 MG tablet  Commonly known as:  LIPITOR  Take 1 tablet (80 mg total) by mouth daily.     carvedilol 6.25 MG tablet  Commonly known as:  COREG  Take 1 tablet (6.25 mg total) by mouth 2 (two) times daily with a meal.     lisinopril 5 MG tablet  Commonly known as:  PRINIVIL,ZESTRIL  Take 1 tablet (5 mg total) by mouth daily.     multivitamin with minerals Tabs tablet  Take 1 tablet by mouth daily.     nicotine 14 mg/24hr patch  Commonly known as:  NICODERM CQ - dosed in mg/24 hours  Place 1 patch (14 mg  total) onto the skin daily.     nitroGLYCERIN 0.4 MG SL tablet  Commonly known as:  NITROSTAT  Place 1 tablet (0.4 mg total) under the tongue every 5 (five) minutes as needed for chest pain.     pantoprazole 40 MG tablet  Commonly known as:  PROTONIX  Take 1 tablet (40 mg total) by mouth daily.     ticagrelor 90 MG Tabs tablet  Commonly known as:  BRILINTA  Take 1 tablet (90 mg total) by mouth 2 (two) times daily.     varenicline 0.5 MG X 11 & 1 MG X 42 tablet  Commonly known as:  CHANTIX STARTING MONTH PAK  Take one 0.5 mg tab once daily for 3 days, then increase to 0.5 mg tab twice daily for 4 days, then increase to one 1 mg tab twice daily     warfarin 5 MG tablet  Commonly known as:  COUMADIN  Take 1-2.5 tablets (5-12.5 mg total) by mouth daily. Start with 12.5 mg daily, then per [facility]        Discharge Instructions    Amb Referral to Cardiac Rehabilitation    Complete by:  As directed      Diet - low sodium heart healthy    Complete by:  As directed      Increase activity slowly    Complete by:  As directed           Follow-up Information    Follow up with [facility] On 04/03/2014.   Why:  Please arrive :15  and actual appointment @ 10:30 for PCP. Please ask to speak to [facility] Program  Liaison for assistance with Brilinta paperwork.   Contact information:   [address] [place] [address] [phone]      Follow up with Leone Brand, NP On 04/04/2014.   Specialty:  Cardiology   Why:  Provider visit and Coumadin check starting at 10 AM. Please arrive 15 minutes early for paperwork.Benay Pillow information:   [address] [place] [address] [phone]       Follow up with [facility] On 04/28/2014.   Why:  2:30 Financial Appointment for medication assistance.    Contact  information:   [address] [place]  [id number] [phone]      BRING ALL MEDICATIONS WITH YOU TO FOLLOW UP APPOINTMENTS  Time spent with patient to include physician time: 48 min Signed: Theodore Demarkhonda Barrett, PA-C 04/02/2014, 12:20 PM Co-Sign MD

## 2014-04-03 ENCOUNTER — Ambulatory Visit: Payer: Medicaid Other | Attending: Family Medicine | Admitting: Family Medicine

## 2014-04-03 VITALS — BP 129/83 | HR 74 | Temp 97.9°F | Resp 16 | Ht 73.0 in | Wt 223.0 lb

## 2014-04-03 DIAGNOSIS — I10 Essential (primary) hypertension: Secondary | ICD-10-CM | POA: Insufficient documentation

## 2014-04-03 DIAGNOSIS — I214 Non-ST elevation (NSTEMI) myocardial infarction: Secondary | ICD-10-CM

## 2014-04-03 DIAGNOSIS — I252 Old myocardial infarction: Secondary | ICD-10-CM | POA: Diagnosis not present

## 2014-04-03 DIAGNOSIS — I255 Ischemic cardiomyopathy: Secondary | ICD-10-CM

## 2014-04-03 DIAGNOSIS — Z955 Presence of coronary angioplasty implant and graft: Secondary | ICD-10-CM | POA: Diagnosis not present

## 2014-04-03 NOTE — Telephone Encounter (Signed)
Spoke w/wife who was on her way from home and wanted to call us back when she got home.  Will follow up before 5 pm.

## 2014-04-03 NOTE — Progress Notes (Signed)
Patient ID: Thomas Woods, male   DOB: 22-Jul-1965, 49 y.o.   MR[facility] [id number] [facility]  [facility] follow-[facility], sp MI and stent   Subjective:  Patient presents for follow-up hospital stay for MI and stenting.  His hospital notes and cath lab notes are in the chart and have been reviewed.  He reports his pain started on March 16 with a burning pain in the center of his chest. He thought it was heart burn. He was anxious and unable to get comfortable, pacing around so went to ED. He denies having any related SOB, N/V or radiation. He did report being a little sweaty. He has a history of Hypertension and was also diagnosed with L. Ventricular  Thrombus and ischemis cardiomyopathy.  He has an appointment with cardiologist tomorrow.  He has a appointment with [facility] on April 18. His vitals are consistent with vitals when he left the hospital yesterday. He reports that he has not smoked since the day of his admission and is committed to quitting. He was prescribed nicotine patches and Chantix but cannot afford the Chantix.     ROS:  GEN:   Denies fever, chills,WT loss Skin:   Denies lesions or rashes HENT:   Denies  earache, epistaxis, sore throat, or neck pain, or headaches                LUNGS:  Denies SOB with rest or walking CV:   Denies CP or palpitations ABD:   Denies abdominal pain, nausea,and vomiting            EXT:    Denies muscle spasms or swelling; no pain in lower ext, no weakness NEURO:   Denies numbness or tingling, denies seizures  Objective:  Filed Vitals:   04/03/14 1050  BP: 129/83  Pulse: 74  Temp: 97.9 F (36.6 C)  TempSrc: Oral  Resp: 16  Height: 6\' 1"  (1.854 m)  Weight: 223 lb (101.152 kg)  SpO2: 99%    Physical Exam:  General:  in no acute distress. HEENT:  no pallor, no icterus, moist oral mucosa, no  lymphadenopathy Heart:   Normal  s1 &s2  Regular rate and rhythm, without M,G,R Lungs:   Clear to auscultation bilaterally. Abdomen:  Soft, nontender,  nondistended, positive bowel sounds. Exetremeties:  No pedal edema,pedal pulses normal. Neuro:   Alert, awake, oriented x3, nonfocal.  Pertinent Lab Results:   Medications: Prior to Admission medications   Medication Sig Start Date End Date Taking? Authorizing Provider  aspirin 81 MG tablet Take 1 tablet (81 mg total) by mouth daily. 04/02/14  Yes Rhonda G Barrett, PA-C  atorvastatin (LIPITOR) 80 MG tablet Take 1 tablet (80 mg total) by mouth daily. 04/02/14  Yes Rhonda G Barrett, PA-C  carvedilol (COREG) 6.25 MG tablet Take 1 tablet (6.25 mg total) by mouth 2 (two) times daily with a meal. 04/02/14  Yes Rhonda G Barrett, PA-C  lisinopril (PRINIVIL,ZESTRIL) 5 MG tablet Take 1 tablet (5 mg total) by mouth daily. 04/02/14  Yes Rhonda G Barrett, PA-C  Multiple Vitamin (MULTIVITAMIN WITH MINERALS) TABS tablet Take 1 tablet by mouth daily.   Yes Historical Provider, MD  nicotine (NICODERM CQ - DOSED IN MG/24 HOURS) 14 mg/24hr patch Place 1 patch (14 mg total) onto the skin daily. 04/02/14  Yes Rhonda G Barrett, PA-C  nitroGLYCERIN (NITROSTAT) 0.4 MG SL tablet Place 1 tablet (0.4 mg total) under the tongue every 5 (five) minutes as needed for chest pain. 04/02/14  Yes Joline Salthonda G  Barrett, PA-C  pantoprazole (PROTONIX) 40 MG tablet Take 1 tablet (40 mg total) by mouth daily. 04/02/14  Yes Rhonda G Barrett, PA-C  ticagrelor (BRILINTA) 90 MG TABS tablet Take 1 tablet (90 mg total) by mouth 2 (two) times daily. 04/02/14  Yes Rhonda G Barrett, PA-C  varenicline (CHANTIX STARTING MONTH PAK) 0.5 MG X 11 & 1 MG X 42 tablet Take one 0.5 mg tab once daily for 3 days, then increase to 0.5 mg tab twice daily for 4 days, then increase to one 1 mg tab twice daily 04/02/14  Yes Rhonda G Barrett, PA-C  warfarin (COUMADIN) 5 MG tablet Take 1-2.5 tablets (5-12.5 mg total) by mouth daily. Start with 12.5 mg daily, then per [facility] 04/02/14  Yes Joline Salt Barrett, PA-C    Assessment: 1. S/P MI 2. Need for PCP 3. Need for  medication assistance  Plan: 1.  Follow-up with Dr. Annie Paras tomorrow as planned 2. Make appointment here with assigned PCP for one month. 3. Keep appointment with [facility] on April 18th.  Follow up:  The patient was given clear instructions to go to ER or return to medical center if symptoms don't improve, worsen or new problems develop. The patient verbalized understanding.     Henrietta Hoover, FNP,BC 04/03/2014, 11:06 AM

## 2014-04-03 NOTE — Patient Instructions (Signed)
1.  Take medications as ordered and see Dr. Annie ParasIngold as planned.  2.  Go to hospital for Chest pain and/or SOB or other symptoms return.

## 2014-04-03 NOTE — Telephone Encounter (Signed)
[facility].  Patient understands to follow up with provider Nada BoozerLaura Ingold on [facility]/04/14 at [facility] Patient understands discharge instructions? yes Patient understands medications and regiment? yes Patient understands to bring all medications to this visit? yes  Was d/c yesterday 3/23 but appointment made for f/u on 3/25 due to being on new coumadin start.  States he feels great.  Wants to do more than he should at this point.  Advised to gradually increase activity.  States (R) wrist cath site does not have any swelling or redness. Understands to come in tomorrow for OV and coumadin check.  States he was able to get all of his medicines except Chantix, NTG and 1 other pill-couldn't remember.  States he got all of his heart and BP meds.  Is unemployed at this time and does not have enough money to buy all the meds. Is taking the Brilinta and coumadin as directed. Verbalized understanding of all meds.

## 2014-04-03 NOTE — Progress Notes (Signed)
Pt is here to establish care.on the 16th of this month pt suffered an MI. Pt had 2 stents put in pt is wearing a life vest monitor.  Pt states that he feels great and blessed.

## 2014-04-03 NOTE — Telephone Encounter (Signed)
I left a message for the patient to call. 

## 2014-04-04 ENCOUNTER — Ambulatory Visit (INDEPENDENT_AMBULATORY_CARE_PROVIDER_SITE_OTHER): Payer: Self-pay | Admitting: Cardiology

## 2014-04-04 ENCOUNTER — Encounter: Payer: Self-pay | Admitting: Cardiology

## 2014-04-04 ENCOUNTER — Ambulatory Visit (INDEPENDENT_AMBULATORY_CARE_PROVIDER_SITE_OTHER): Payer: Self-pay | Admitting: *Deleted

## 2014-04-04 VITALS — BP 110/80 | HR 70 | Ht 73.0 in | Wt 221.0 lb

## 2014-04-04 DIAGNOSIS — Z5181 Encounter for therapeutic drug level monitoring: Secondary | ICD-10-CM

## 2014-04-04 DIAGNOSIS — I213 ST elevation (STEMI) myocardial infarction of unspecified site: Secondary | ICD-10-CM

## 2014-04-04 DIAGNOSIS — I214 Non-ST elevation (NSTEMI) myocardial infarction: Secondary | ICD-10-CM

## 2014-04-04 DIAGNOSIS — I255 Ischemic cardiomyopathy: Secondary | ICD-10-CM

## 2014-04-04 DIAGNOSIS — I513 Intracardiac thrombosis, not elsewhere classified: Secondary | ICD-10-CM

## 2014-04-04 DIAGNOSIS — Z7901 Long term (current) use of anticoagulants: Secondary | ICD-10-CM

## 2014-04-04 DIAGNOSIS — Z9189 Other specified personal risk factors, not elsewhere classified: Secondary | ICD-10-CM

## 2014-04-04 LAB — POCT INR: INR: 2.9

## 2014-04-04 NOTE — Progress Notes (Signed)
Cardiology Office Note   Date:  04/04/2014   ID:  Thomas SolGuy Kobel, DOB 1965/07/22, MRN [id number]  PCP:  No PCP Per Patient  Cardiologist:  Dr. Eldridge DaceVaranasi     Chief Complaint  Patient presents with  . Hospitalization Follow-up    for NSTEMI of ant wall, rec'd stents to LAD, also cadiomyopathy      History of Present Illness: Thomas Woods is a 49 y.o. male who presents for follow up after NSTEMI, cardiomyopathy and is wearing a lifevest.   Admitted 3/16-3/23/16 with a burning chest pain, troponin was positive with pk of 1.41.  Was found on echo to have EF of 25-30%, and apical thrombus. With ongoing chest pain, he underwent urgent cardiac cath also with NSVT. Pt with Ant wall MI.   Cath with occluded mLAD, mild disease in LCX and mod disease in RCA,   EF 30%, underwent PCI 2 overlapping Synergy DES.  Discharged with life vest.  With the apical thrombus he was placed on Heparin and then coumadin.  For his ICM he was placed on ACE in addition to BB.  He is on statin. Also on Brilinta and ASA for his stents.  Lung nodule - incidentally found on lung CT, as a smoker he is a high risk, he will need follow up CT in 6 months.  Today he states he has not smoked, using Nicoderm patches, could not afford Chantix.  Also has not been able to afford NTG.  He is walking daily and wearing his lifevest.  He is taking his meds except for the above he cannot afford and he will obtain OTC protonix or prevacid.    Past Medical History  Diagnosis Date  . Hypertension   . NSTEMI (non-ST elevated myocardial infarction) 03/26/2014    Patient pain increased and he developed ECG changes, taken to the cath lab as a STEMI, PCI to the LAD  . Ischemic cardiomyopathy 03/27/2014    EF 30% at cath, confirmed by echo  . 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 Multiple lung nodules on CT 03/28/2014    Right lung nodules up to 6 mm, repeat CT in 6-12 months  . Hyperlipidemia LDL goal <70  03/27/2014    Past Surgical History  Procedure Laterality Date  . Hernia repair    . Left heart catheterization with coronary angiogram N/A 03/27/2014    Procedure: LEFT HEART CATHETERIZATION WITH CORONARY ANGIOGRAM;  Surgeon: Corky CraftsJayadeep S Varanasi, MD; L main okay, LAD 100%, CFX okay, RI moderate disease, OM1 mild disease, RCA moderate disease, EF 30%  . Percutaneous coronary stent intervention (pci-s)  03/27/2014    3.0 x 32 Synergy overlapped with 2.5 x 38 Synergy to the mLAD     Current Outpatient Prescriptions  Medication Sig Dispense Refill  . aspirin 81 MG tablet Take 1 tablet (81 mg total) by mouth daily. 30 tablet 0  . atorvastatin (LIPITOR) 80 MG tablet Take 1 tablet (80 mg total) by mouth daily. 30 tablet 11  . carvedilol (COREG) 6.25 MG tablet Take 1 tablet (6.25 mg total) by mouth 2 (two) times daily with a meal. 60 tablet 11  . lisinopril (PRINIVIL,ZESTRIL) 5 MG tablet Take 1 tablet (5 mg total) by mouth daily. 30 tablet 11  . Multiple Vitamin (MULTIVITAMIN WITH MINERALS) TABS tablet Take 1 tablet by mouth daily.    . nicotine (NICODERM CQ - DOSED IN MG/24 HOURS) 14 mg/24hr patch Place 1 patch (14  mg total) onto the skin daily. 14 patch 0  . nitroGLYCERIN (NITROSTAT) 0.4 MG SL tablet Place 1 tablet (0.4 mg total) under the tongue every 5 (five) minutes as needed for chest pain. (Patient taking differently: Place 0.4 mg under the tongue every 5 (five) minutes as needed for chest pain. ON HOLD DUE TO COST.) 25 tablet 3  . pantoprazole (PROTONIX) 40 MG tablet Take 1 tablet (40 mg total) by mouth daily. (Patient taking differently: Take 40 mg by mouth daily. ON HOLD DUE TO COST) 30 tablet 11  . ticagrelor (BRILINTA) 90 MG TABS tablet Take 1 tablet (90 mg total) by mouth 2 (two) times daily. 60 tablet 11  . warfarin (COUMADIN) 5 MG tablet Take 1-2.5 tablets (5-12.5 mg total) by mouth daily. Start with 12.5 mg daily, then per [facility] 75 tablet 11   No current  facility-administered medications for this visit.    Allergies:   Review of patient's allergies indicates no known allergies.    Social History:  The patient  reports that he quit smoking 9 days ago. His smoking use included Cigarettes. He smoked 0.50 packs per day. He does not have any smokeless tobacco history on file. He reports that he drinks alcohol. He reports that he does not use illicit drugs.   Family History:  The patient's family history includes Healthy in his brother, mother, and sister.    ROS:  General:no colds or fevers, no weight changes Skin:no rashes or ulcers HEENT:no blurred vision, no congestion CV:see HPI PUL:see HPI GI:no diarrhea constipation or melena, no indigestion GU:no hematuria, no dysuria MS:no joint pain, no claudication Neuro:no syncope, no lightheadedness Endo:no diabetes, no thyroid disease  Wt Readings from Last 3 Encounters:  04/04/14 221 lb (100.245 kg)  04/03/14 223 lb (101.152 kg)  04/02/14 217 lb 9.6 oz (98.703 kg)     PHYSICAL EXAM: VS:  BP 110/80 mmHg  Pulse 70  Ht  (1.854 m)  Wt 221 lb (100.245 kg)  BMI 29.16 kg/m2 , BMI Body mass index is 29.16 kg/(m^2). General:Pleasant affect, NAD Skin:Warm and dry, brisk capillary refill HEENT:normocephalic, sclera clear, mucus membranes moist Neck:supple, no JVD, no bruits  Heart:S1S2 RRR without murmur, gallup, rub or click Lungs:clear without rales, rhonchi, or wheezes GEX:BMWU, non tender, + BS, do not palpate liver spleen or masses Ext:no lower ext edema, 2+ pedal pulses, 2+ radial pulses Neuro:alert and oriented X 3, MAE, follows commands, + facial symmetry    EKG:  EKG is ordered today. The ekg ordered today demonstrates SR rate of 70 and improved from 03/28/14.   Recent Labs: 03/26/2014: B Natriuretic Peptide 48.4 03/27/2014: TSH 0.769 03/28/2014: ALT 23; BUN 7; Creatinine 1.07; Potassium 4.3; Sodium 137 04/02/2014: Hemoglobin 14.6; Platelets 201    Lipid Panel      Component Value Date/Time   CHOL 210* 03/27/2014 0833   TRIG 62 03/27/2014 0833   HDL 64 03/27/2014 0833   CHOLHDL 3.3 03/27/2014 0833   VLDL 12 03/27/2014 0833   LDLCALC 134* 03/27/2014 0833       Other studies Reviewed: Additional studies/ records that were reviewed today include: hospital note, cath note, PCP note. ECHO.   ASSESSMENT AND PLAN:  Acute anterior wall MI -NSTEMI- s/p overlapping DES to the LAD, non obstructive disease to LCX and RCA.  LV (left ventricular) mural thrombus- now on coumadin, for coumadin check today. No bleeding.  Nonsustained VT- reperfusion    Ischemic cardiomyopathy - EF 30% echo, Life Vest in  place.  Will follow up in 2 weeks with APP to eval for titration of meds, today BP soft.  He will also see Dr. Eldridge Dace in 8 weeks.  Risk for sudden cardiac death -Will need titration of meds and then follow up echo in 3 months to determine if ICD needed.    Hyperlipidemia- on lipitor 80 mg - LDL when started 134, goal <70.  Needs recheck in 6-8 weeks.  Pulmonary nodules - seen on CXR/CT 03/2014, recheck in 6-12 months  Tobacco abuse-has stopped smoking but he cannot afford Chantix, will use Nicoderm instead.   Current medicines are reviewed with the patient today.  The patient Has no concerns regarding medicines.  The following changes have been made:  See above Labs/ tests ordered today include:see above  Disposition:   FU:  see above  Nyoka Lint, NP  04/04/2014 12:10 PM    [facility] [address] [place], [place], [place]  [address]/ [address] [place], [place] Phone: [phone]; Fax: [phone]  [phone]

## 2014-04-04 NOTE — Patient Instructions (Signed)

## 2014-04-04 NOTE — Patient Instructions (Addendum)
FOLLOW UP WITH AN FLEX APP IN 2 TO 3 WEEKS   FOLLOW UP WITH DR Eldridge Dace IN 6 WEEKS     Weigh daily Call [phone] if weight climbs more than 3 pounds in a day or 5 pounds in a week. No salt to very little salt in your diet.  No more than 2000 mg in a day. Call if increased shortness of breath or increased swelling.   buy protonix for acid reflux also called pantoprazole 40 mg or Prevacid 30 mg one daily.   Call [facility] at [phone] if any problems or questions.     Smoking Cessation Quitting smoking is important to your health and has many advantages. However, it is not always easy to quit since nicotine is a very addictive drug. Oftentimes, people try 3 times or more before being able to quit. This document explains the best ways for you to prepare to quit smoking. Quitting takes hard work and a lot of effort, but you can do it. ADVANTAGES OF QUITTING SMOKING  You will live longer, feel better, and live better.  Your body will feel the impact of quitting smoking almost immediately.  Within 20 minutes, blood pressure decreases. Your pulse returns to its normal level.  After 8 hours, carbon monoxide levels in the blood return to normal. Your oxygen level increases.  After 24 hours, the chance of having a heart attack starts to decrease. Your breath, hair, and body stop smelling like smoke.  After 48 hours, damaged nerve endings begin to recover. Your sense of taste and smell improve.  After 72 hours, the body is virtually free of nicotine. Your bronchial tubes relax and breathing becomes easier.  After 2 to 12 weeks, lungs can hold more air. Exercise becomes easier and circulation improves.  The risk of having a heart attack, stroke, cancer, or lung disease is greatly reduced.  After 1 year, the risk of coronary heart disease is cut in half.  After 5 years, the risk of stroke falls to the same as a nonsmoker.  After 10 years, the risk of lung cancer  is cut in half and the risk of other cancers decreases significantly.  After 15 years, the risk of coronary heart disease drops, usually to the level of a nonsmoker.  If you are pregnant, quitting smoking will improve your chances of having a healthy baby.  The people you live with, especially any children, will be healthier.  You will have extra money to spend on things other than cigarettes. QUESTIONS TO THINK ABOUT BEFORE ATTEMPTING TO QUIT You may want to talk about your answers with your health care provider.  Why do you want to quit?  If you tried to quit in the past, what helped and what did not?  What will be the most difficult situations for you after you quit? How will you plan to handle them?  Who can help you through the tough times? Your family? Friends? A health care provider?  What pleasures do you get from smoking? What ways can you still get pleasure if you quit? Here are some questions to ask your health care provider:  How can you help me to be successful at quitting?  What medicine do you think would be best for me and how should I take it?  What should I do if I need more help?  What is smoking withdrawal like? How can I get information on withdrawal? GET READY  Set a quit date.  Change your environment by getting rid of all cigarettes, ashtrays, matches, and lighters in your home, car, or work. Do not let people smoke in your home.  Review your past attempts to quit. Think about what worked and what did not. GET SUPPORT AND ENCOURAGEMENT You have a better chance of being successful if you have help. You can get support in many ways.  Tell your family, friends, and coworkers that you are going to quit and need their support. Ask them not to smoke around you.  Get individual, group, or telephone counseling and support. Programs are available at [facility] and health [facility]. Call your local health department for information about programs in your  area.  Spiritual beliefs and practices may help some smokers quit.  Download a "quit meter" on your computer to keep track of quit statistics, such as how long you have gone without smoking, cigarettes not smoked, and money saved.  Get a self-help book about quitting smoking and staying off tobacco. LEARN NEW SKILLS AND BEHAVIORS  Distract yourself from urges to smoke. Talk to someone, go for a walk, or occupy your time with a task.  Change your normal routine. Take a different route to work. Drink tea instead of coffee. Eat breakfast in a different place.  Reduce your stress. Take a hot bath, exercise, or read a book.  Plan something enjoyable to do every day. Reward yourself for not smoking.  Explore interactive web-based programs that specialize in helping you quit. GET MEDICINE AND USE IT CORRECTLY Medicines can help you stop smoking and decrease the urge to smoke. Combining medicine with the above behavioral methods and support can greatly increase your chances of successfully quitting smoking.  Nicotine replacement therapy helps deliver nicotine to your body without the negative effects and risks of smoking. Nicotine replacement therapy includes nicotine gum, lozenges, inhalers, nasal sprays, and skin patches. Some may be available over-the-counter and others require a prescription.  Antidepressant medicine helps people abstain from smoking, but how this works is unknown. This medicine is available by prescription.  Nicotinic receptor partial agonist medicine simulates the effect of nicotine in your brain. This medicine is available by prescription. Ask your health care provider for advice about which medicines to use and how to use them based on your health history. Your health care provider will tell you what side effects to look out for if you choose to be on a medicine or therapy. Carefully read the information on the package. Do not use any other product containing nicotine while  using a nicotine replacement product.  RELAPSE OR DIFFICULT SITUATIONS Most relapses occur within the first 3 months after quitting. Do not be discouraged if you start smoking again. Remember, most people try several times before finally quitting. You may have symptoms of withdrawal because your body is used to nicotine. You may crave cigarettes, be irritable, feel very hungry, cough often, get headaches, or have difficulty concentrating. The withdrawal symptoms are only temporary. They are strongest when you first quit, but they will go away within 10-14 days. To reduce the chances of relapse, try to:  Avoid drinking alcohol. Drinking lowers your chances of successfully quitting.  Reduce the amount of caffeine you consume. Once you quit smoking, the amount of caffeine in your body increases and can give you symptoms, such as a rapid heartbeat, sweating, and anxiety.  Avoid smokers because they can make you want to smoke.  Do not let weight gain distract you. Many smokers will gain  weight when they quit, usually less than 10 pounds. Eat a healthy diet and stay active. You can always lose the weight gained after you quit.  Find ways to improve your mood other than smoking. FOR MORE INFORMATION  www.smokefree.gov  Document Released: 12/21/2000 Document Revised: 05/13/2013 Document Reviewed: 04/07/2011 [facility] Patient Information 2015 [place], [place]. This information is not intended to replace advice given to you by your health care provider. Make sure you discuss any questions you have with your health care provider.

## 2014-04-11 ENCOUNTER — Ambulatory Visit (INDEPENDENT_AMBULATORY_CARE_PROVIDER_SITE_OTHER): Payer: Self-pay | Admitting: *Deleted

## 2014-04-11 DIAGNOSIS — I214 Non-ST elevation (NSTEMI) myocardial infarction: Secondary | ICD-10-CM

## 2014-04-11 DIAGNOSIS — I213 ST elevation (STEMI) myocardial infarction of unspecified site: Secondary | ICD-10-CM

## 2014-04-11 DIAGNOSIS — I513 Intracardiac thrombosis, not elsewhere classified: Secondary | ICD-10-CM

## 2014-04-11 DIAGNOSIS — Z5181 Encounter for therapeutic drug level monitoring: Secondary | ICD-10-CM

## 2014-04-11 LAB — POCT INR: INR: 4.5

## 2014-04-12 ENCOUNTER — Telehealth: Payer: Self-pay | Admitting: Physician Assistant

## 2014-04-12 NOTE — Telephone Encounter (Signed)
Patient contacted cardiology for advice on bleeding, apparently he had dental extraction yrs ago. Received placed on ASA and brilinta for mid LAD stent in Mar 2016. Also on coumadin for apical thrombus noted on echo. Has been bleeding after brushing his teeth too hard this morning.  I have advised patient to placed gauze and pressure on the area. Given his recent stent, discontinuation of ASA or brilinta is dangerous, however maybe necessary if has significant bleeding, however that is at the discretion of his cardiology. For now, I have advised him to continue on the medication and try to control bleeding. Urged not to brush too hard.  Ramond DialSigned, Jaslin Novitski PA Pager: [phone]

## 2014-04-18 ENCOUNTER — Ambulatory Visit (INDEPENDENT_AMBULATORY_CARE_PROVIDER_SITE_OTHER): Payer: Self-pay | Admitting: *Deleted

## 2014-04-18 DIAGNOSIS — I214 Non-ST elevation (NSTEMI) myocardial infarction: Secondary | ICD-10-CM

## 2014-04-18 DIAGNOSIS — I213 ST elevation (STEMI) myocardial infarction of unspecified site: Secondary | ICD-10-CM

## 2014-04-18 DIAGNOSIS — Z5181 Encounter for therapeutic drug level monitoring: Secondary | ICD-10-CM

## 2014-04-18 DIAGNOSIS — I513 Intracardiac thrombosis, not elsewhere classified: Secondary | ICD-10-CM

## 2014-04-18 LAB — PROTIME-INR
INR: 8 ratio (ref 0.8–1.0)
PROTHROMBIN TIME: 83.6 s — AB (ref 9.6–13.1)

## 2014-04-18 LAB — POCT INR: INR: 7.9

## 2014-04-23 ENCOUNTER — Encounter: Payer: Self-pay | Admitting: Physician Assistant

## 2014-04-23 ENCOUNTER — Ambulatory Visit (INDEPENDENT_AMBULATORY_CARE_PROVIDER_SITE_OTHER): Payer: Self-pay | Admitting: Physician Assistant

## 2014-04-23 VITALS — BP 136/84 | HR 80 | Ht 73.0 in | Wt 221.4 lb

## 2014-04-23 DIAGNOSIS — I2109 ST elevation (STEMI) myocardial infarction involving other coronary artery of anterior wall: Secondary | ICD-10-CM

## 2014-04-23 MED ORDER — CARVEDILOL 12.5 MG PO TABS: 12.5000 mg | ORAL_TABLET | Freq: Two times a day (BID) | ORAL | Status: DC

## 2014-04-23 NOTE — Patient Instructions (Addendum)
Increase carvedilol to 12.5 mg twice a day, [facility] OK to take 2 of your current carvedilol tablets twice a day to use them up.   Keep your coumadin appointment on Friday.  Continue all other medications you are taking.  Continue daily weights.  Congratulations on not smoking, this is very important.  You will have another echocardiogram before your office visit with Dr Eldridge DaceVaranasi.

## 2014-04-23 NOTE — Progress Notes (Signed)
Cardiology Office Note   Date:  04/23/2014   ID:  Thomas Woods, DOB Mar 06, 1965, MRN [id number]  PCP:  No PCP Per Patient  Cardiologist:  Dr Caryl Ada Jeffrie Lofstrom, PA-C   Titration of medications, follow-up  History of Present Illness: Thomas Woods is a 49 y.o. male with a history of NSTEMI>>STEMI w/ DES x 2 to the LAD 03/27/2014, med rx for mod RCA dz, ICM w/ EF 25-30% w/ LifeVest, apical thrombus on coumadin. Rx w/ ASA, Brilinta, Coreg and statin.   He presents for continued cardiac management  [name] has had no chest pain. He has been able to do things such as walking his dog and climbs stairs to his apartment without difficulty. He is compliant with his medications. He is wearing nicotine patches daily and has not smoked. Denies lower extremity edema. His weight has not changed and he is weighing himself daily. New problems or concerns.  Past Medical History  Diagnosis Date  . Hypertension   . NSTEMI (non-ST elevated myocardial infarction) 03/26/2014    Patient pain increased and he developed ECG changes, taken to the cath lab as a STEMI, PCI to the LAD  . Ischemic cardiomyopathy 03/27/2014    EF 30% at cath, confirmed by echo  . 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 Multiple lung nodules on CT 03/28/2014    Right lung nodules up to 6 mm, repeat CT in 6-12 months  . Hyperlipidemia LDL goal <70 03/27/2014    Past Surgical History  Procedure Laterality Date  . Hernia repair    . Left heart catheterization with coronary angiogram N/A 03/27/2014    Procedure: LEFT HEART CATHETERIZATION WITH CORONARY ANGIOGRAM;  Surgeon: Corky Crafts, MD; L main okay, LAD 100%, CFX okay, RI moderate disease, OM1 mild disease, RCA moderate disease, EF 30%  . Percutaneous coronary stent intervention (pci-s)  03/27/2014    3.0 x 32 Synergy overlapped with 2.5 x 38 Synergy to the mLAD    Current Outpatient Prescriptions  Medication Sig Dispense Refill    . aspirin 81 MG tablet Take 1 tablet (81 mg total) by mouth daily. 30 tablet 0  . atorvastatin (LIPITOR) 80 MG tablet Take 1 tablet (80 mg total) by mouth daily. 30 tablet 11  . carvedilol (COREG) 12.5 MG tablet Take 1 tablet (12.5 mg total) by mouth 2 (two) times daily with a meal. 60 tablet 11  . lisinopril (PRINIVIL,ZESTRIL) 5 MG tablet Take 1 tablet (5 mg total) by mouth daily. 30 tablet 11  . Multiple Vitamin (MULTIVITAMIN WITH MINERALS) TABS tablet Take 1 tablet by mouth daily.    . nicotine (NICODERM CQ - DOSED IN MG/24 HOURS) 14 mg/24hr patch Place 1 patch (14 mg total) onto the skin daily. 14 patch 0  . nitroGLYCERIN (NITROSTAT) 0.4 MG SL tablet Place 1 tablet (0.4 mg total) under the tongue every 5 (five) minutes as needed for chest pain. (Patient taking differently: Place 0.4 mg under the tongue every 5 (five) minutes as needed for chest pain. ON HOLD DUE TO COST.) 25 tablet 3  . pantoprazole (PROTONIX) 40 MG tablet Take 1 tablet (40 mg total) by mouth daily. (Patient taking differently: Take 40 mg by mouth daily. ON HOLD DUE TO COST) 30 tablet 11  . ticagrelor (BRILINTA) 90 MG TABS tablet Take 1 tablet (90 mg total) by mouth 2 (two) times daily. 60 tablet 11  . warfarin (COUMADIN) 5 MG tablet  Take 1-2.5 tablets (5-12.5 mg total) by mouth daily. Start with 12.5 mg daily, then per [facility]madin Clinic 75 tablet 11   No current facility-administered medications for this visit.    Allergies:   Review of patient's allergies indicates no known allergies.    Social History:  The patient  reports that he quit smoking about 4 weeks ago. His smoking use included Cigarettes. He smoked 0.50 packs per day. He does not have any smokeless tobacco history on file. He reports that he drinks alcohol. He reports that he does not use illicit drugs.   Family History:  The patient's family history includes Healthy in his brother, mother, and sister.    ROS:  Please see the history of present illness. All  other systems are reviewed and negative.    PHYSICAL EXAM: VS:  BP 136/84 mmHg  Pulse 80  Ht 6\' 1"  (1.854 m)  Wt 221 lb 6.4 oz (100.426 kg)  BMI 29.22 kg/m2 , BMI Body mass index is 29.22 kg/(m^2). GEN: Well nourished, well developed, in no acute distress HEENT: normal Neck: no JVD, carotid bruits, or masses Cardiac: RRR; no murmurs, rubs, or gallops,no edema  Respiratory:  clear to auscultation bilaterally, normal work of breathing GI: soft, nontender, nondistended, + BS MS: no deformity or atrophy Skin: warm and dry, no rash Neuro:  Strength and sensation are intact Psych: euthymic mood, full affect   EKG:  EKG is not ordered today.  Recent Labs: 03/26/2014: B Natriuretic Peptide 48.4 03/27/2014: TSH 0.769 03/28/2014: ALT 23; BUN 7; Creatinine 1.07; Potassium 4.3; Sodium 137 04/02/2014: Hemoglobin 14.6; Platelets 201    Lipid Panel    Component Value Date/Time   CHOL 210* 03/27/2014 0833   TRIG 62 03/27/2014 0833   HDL 64 03/27/2014 0833   CHOLHDL 3.3 03/27/2014 0833   VLDL 12 03/27/2014 0833   LDLCALC 134* 03/27/2014 0833     Wt Readings from Last 3 Encounters:  04/23/14 221 lb 6.4 oz (100.426 kg)  04/04/14 221 lb (100.245 kg)  04/03/14 223 lb (101.152 kg)     Other studies Reviewed: Additional studies/ records that were reviewed today include: Previous office visits, labs.  ASSESSMENT AND PLAN:  1. Hypertension: His blood pressure somewhat elevated today as is his heart rate. Previous Lee, his systolic blood pressure was 110 and heart rate was 70. Believe that we can increase his Coreg from 6.25 mg twice a day up to 12.5 mg twice a day. He is to take this and follow up with Dr. Abe PeopleVaransi.  2. Recent non-STEMI: Continue current medical therapy with aspirin, Brilinta, beta blocker, statin and ACE inhibitor. He is to stop the aspirin after 30 days.  3. LV thrombus: He is on Coumadin for this. Recently his INR was elevated. He is being followed closely by the  Coumadin clinic, and is to stop the aspirin after 30 days. A recheck of his EF and LV thrombus will be ordered prior to his visit with Dr. Abe PeopleVaransi.  Current medicines are reviewed at length with the patient today.  The patient does not have concerns regarding medicines.  The following changes have been made:  Increase Coreg to 12.5 mg bid  Labs/ tests ordered today include:    Orders Placed This Encounter  Procedures  . 2D Echocardiogram with contrast     Disposition:   FU with Dr Eldridge DaceVaranasi as scheduled.  Melida QuitterSigned, Kairee Isa, PA-C  04/23/2014 12:38 PM    [facility] [address]  [address], [place], [place]  [address] Phone: [phone]; Fax: [phone]

## 2014-04-25 ENCOUNTER — Ambulatory Visit (INDEPENDENT_AMBULATORY_CARE_PROVIDER_SITE_OTHER): Payer: Self-pay | Admitting: *Deleted

## 2014-04-25 DIAGNOSIS — I513 Intracardiac thrombosis, not elsewhere classified: Secondary | ICD-10-CM

## 2014-04-25 DIAGNOSIS — I213 ST elevation (STEMI) myocardial infarction of unspecified site: Secondary | ICD-10-CM

## 2014-04-25 DIAGNOSIS — Z5181 Encounter for therapeutic drug level monitoring: Secondary | ICD-10-CM

## 2014-04-25 DIAGNOSIS — I214 Non-ST elevation (NSTEMI) myocardial infarction: Secondary | ICD-10-CM

## 2014-04-25 LAB — POCT INR: INR: 3.4

## 2014-04-28 ENCOUNTER — Ambulatory Visit: Payer: MEDICAID | Attending: Internal Medicine

## 2014-05-02 ENCOUNTER — Ambulatory Visit (INDEPENDENT_AMBULATORY_CARE_PROVIDER_SITE_OTHER): Payer: Self-pay | Admitting: *Deleted

## 2014-05-02 DIAGNOSIS — Z5181 Encounter for therapeutic drug level monitoring: Secondary | ICD-10-CM

## 2014-05-02 DIAGNOSIS — I513 Intracardiac thrombosis, not elsewhere classified: Secondary | ICD-10-CM

## 2014-05-02 DIAGNOSIS — I213 ST elevation (STEMI) myocardial infarction of unspecified site: Secondary | ICD-10-CM

## 2014-05-02 DIAGNOSIS — I214 Non-ST elevation (NSTEMI) myocardial infarction: Secondary | ICD-10-CM

## 2014-05-02 LAB — POCT INR: INR: 3.7

## 2014-05-06 ENCOUNTER — Ambulatory Visit: Payer: Self-pay | Admitting: Family Medicine

## 2014-05-09 ENCOUNTER — Ambulatory Visit (INDEPENDENT_AMBULATORY_CARE_PROVIDER_SITE_OTHER): Payer: Self-pay | Admitting: *Deleted

## 2014-05-09 DIAGNOSIS — Z5181 Encounter for therapeutic drug level monitoring: Secondary | ICD-10-CM

## 2014-05-09 DIAGNOSIS — I213 ST elevation (STEMI) myocardial infarction of unspecified site: Secondary | ICD-10-CM

## 2014-05-09 DIAGNOSIS — I214 Non-ST elevation (NSTEMI) myocardial infarction: Secondary | ICD-10-CM

## 2014-05-09 DIAGNOSIS — I513 Intracardiac thrombosis, not elsewhere classified: Secondary | ICD-10-CM

## 2014-05-09 LAB — POCT INR: INR: 3

## 2014-05-13 ENCOUNTER — Ambulatory Visit: Payer: 59 | Attending: Family Medicine | Admitting: Internal Medicine

## 2014-05-13 ENCOUNTER — Encounter: Payer: Self-pay | Admitting: Internal Medicine

## 2014-05-13 VITALS — BP 105/71 | HR 66 | Temp 98.4°F | Resp 16 | Ht 73.0 in | Wt 224.0 lb

## 2014-05-13 DIAGNOSIS — Z5189 Encounter for other specified aftercare: Secondary | ICD-10-CM | POA: Insufficient documentation

## 2014-05-13 DIAGNOSIS — Z9861 Coronary angioplasty status: Secondary | ICD-10-CM | POA: Insufficient documentation

## 2014-05-13 DIAGNOSIS — I10 Essential (primary) hypertension: Secondary | ICD-10-CM | POA: Insufficient documentation

## 2014-05-13 DIAGNOSIS — I252 Old myocardial infarction: Secondary | ICD-10-CM | POA: Insufficient documentation

## 2014-05-13 DIAGNOSIS — F17213 Nicotine dependence, cigarettes, with withdrawal: Secondary | ICD-10-CM

## 2014-05-13 DIAGNOSIS — Z7901 Long term (current) use of anticoagulants: Secondary | ICD-10-CM | POA: Insufficient documentation

## 2014-05-13 DIAGNOSIS — E785 Hyperlipidemia, unspecified: Secondary | ICD-10-CM | POA: Diagnosis not present

## 2014-05-13 DIAGNOSIS — Z7982 Long term (current) use of aspirin: Secondary | ICD-10-CM | POA: Diagnosis not present

## 2014-05-13 DIAGNOSIS — Z86718 Personal history of other venous thrombosis and embolism: Secondary | ICD-10-CM | POA: Diagnosis not present

## 2014-05-13 DIAGNOSIS — R918 Other nonspecific abnormal finding of lung field: Secondary | ICD-10-CM | POA: Insufficient documentation

## 2014-05-13 DIAGNOSIS — I255 Ischemic cardiomyopathy: Secondary | ICD-10-CM | POA: Insufficient documentation

## 2014-05-13 DIAGNOSIS — Z87891 Personal history of nicotine dependence: Secondary | ICD-10-CM | POA: Insufficient documentation

## 2014-05-13 DIAGNOSIS — Z09 Encounter for follow-up examination after completed treatment for conditions other than malignant neoplasm: Secondary | ICD-10-CM

## 2014-05-13 DIAGNOSIS — I214 Non-ST elevation (NSTEMI) myocardial infarction: Secondary | ICD-10-CM

## 2014-05-13 MED ORDER — VARENICLINE TARTRATE 0.5 MG X 11 & 1 MG X 42 PO MISC: ORAL | Status: DC

## 2014-05-13 MED ORDER — TICAGRELOR 90 MG PO TABS: 90.0000 mg | ORAL_TABLET | Freq: Two times a day (BID) | ORAL | Status: DC

## 2014-05-13 NOTE — Progress Notes (Signed)
Patient ID: Thomas Woods, male   DOB: 03/22/1965, 49 y.o.   MRN: [id number]   Thomas Woods, is a 49 y.o. male  [id number]  [id number]  DOB - 19-Oct-1965  CC:  Chief Complaint  Patient presents with  . Establish Care  . Hospitalization Follow-up       HPI: Thomas Woods is a 49 y.o. male here today to establish medical care. Patient has recent history of NSTEMI>>STEMI w/ DES x 2 to the LAD 03/27/2014, med rx for mod RCA dz, ICM w/ EF 25-30% w/ LifeVest, apical thrombus on coumadin. Rx w/ ASA, Brilinta, Coreg and statin. Patient is here today to establish care with a primary care doctor. He has no new complaints. He is fairly compliant with his medications and instructions including smoking cessation. He wears his nicotine patch all the time, has not smoked since his event. Denies any lower extremity swelling, denies paroxysmal nocturnal dyspnea, denies orthopnea. Patient has No headache, No chest pain, No abdominal pain - No Nausea, No new weakness tingling or numbness, No Cough - SOB. He needs refill of some of his medications, he also request assistance with prescriptions/PASS program.  No Known Allergies Past Medical History  Diagnosis Date  . Hypertension   . NSTEMI (non-ST elevated myocardial infarction) 03/26/2014    Patient pain increased and he developed ECG changes, taken to the cath lab as a STEMI, PCI to the LAD  . Ischemic cardiomyopathy 03/27/2014    EF 30% at cath, confirmed by echo  . 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 Multiple lung nodules on CT 03/28/2014    Right lung nodules up to 6 mm, repeat CT in 6-12 months  . Hyperlipidemia LDL goal <70 03/27/2014   Current Outpatient Prescriptions on File Prior to Visit  Medication Sig Dispense Refill  . aspirin 81 MG tablet Take 1 tablet (81 mg total) by mouth daily. 30 tablet 0  . atorvastatin (LIPITOR) 80 MG tablet Take 1 tablet (80 mg total) by mouth daily. 30 tablet 11  . carvedilol  (COREG) 12.5 MG tablet Take 1 tablet (12.5 mg total) by mouth 2 (two) times daily with a meal. 60 tablet 11  . lisinopril (PRINIVIL,ZESTRIL) 5 MG tablet Take 1 tablet (5 mg total) by mouth daily. 30 tablet 11  . Multiple Vitamin (MULTIVITAMIN WITH MINERALS) TABS tablet Take 1 tablet by mouth daily.    . nicotine (NICODERM CQ - DOSED IN MG/24 HOURS) 14 mg/24hr patch Place 1 patch (14 mg total) onto the skin daily. 14 patch 0  . nitroGLYCERIN (NITROSTAT) 0.4 MG SL tablet Place 1 tablet (0.4 mg total) under the tongue every 5 (five) minutes as needed for chest pain. (Patient taking differently: Place 0.4 mg under the tongue every 5 (five) minutes as needed for chest pain. ON HOLD DUE TO COST.) 25 tablet 3  . pantoprazole (PROTONIX) 40 MG tablet Take 1 tablet (40 mg total) by mouth daily. (Patient taking differently: Take 40 mg by mouth daily. ON HOLD DUE TO COST) 30 tablet 11  . warfarin (COUMADIN) 5 MG tablet Take 1-2.5 tablets (5-12.5 mg total) by mouth daily. Start with 12.5 mg daily, then per [facility] 75 tablet 11   No current facility-administered medications on file prior to visit.   Family History  Problem Relation Age of Onset  . Healthy Mother   . Healthy Sister   . Healthy Brother    History   Social History  .  Marital Status: Married    Spouse Name: N/A  . Number of Children: N/A  . Years of Education: N/A   Occupational History  . Not on file.   Social History Main Topics  . Smoking status: Former Smoker -- 0.50 packs/day    Types: Cigarettes    Quit date: 03/26/2014  . Smokeless tobacco: Not on file  . Alcohol Use: 0.0 oz/week    0 Standard drinks or equivalent per week  . Drug Use: No  . Sexual Activity: Yes   Other Topics Concern  . Not on file   Social History Narrative    Review of Systems: Constitutional: Negative for fever, chills, diaphoresis, activity change, appetite change and fatigue. HENT: Negative for ear pain, nosebleeds, congestion, facial  swelling, rhinorrhea, neck pain, neck stiffness and ear discharge.  Eyes: Negative for pain, discharge, redness, itching and visual disturbance. Respiratory: Negative for cough, choking, chest tightness, shortness of breath, wheezing and stridor.  Cardiovascular: Negative for chest pain, palpitations and leg swelling. Gastrointestinal: Negative for abdominal distention. Genitourinary: Negative for dysuria, urgency, frequency, hematuria, flank pain, decreased urine volume, difficulty urinating and dyspareunia.  Musculoskeletal: Negative for back pain, joint swelling, arthralgia and gait problem. Neurological: Negative for dizziness, tremors, seizures, syncope, facial asymmetry, speech difficulty, weakness, light-headedness, numbness and headaches.  Hematological: Negative for adenopathy. Does not bruise/bleed easily. Psychiatric/Behavioral: Negative for hallucinations, behavioral problems, confusion, dysphoric mood, decreased concentration and agitation.    Objective:   Filed Vitals:   05/13/14 1152  BP: 105/71  Pulse: 66  Temp: 98.4 F (36.9 C)  Resp: 16    Physical Exam: Constitutional: Patient appears well-developed and well-nourished. No distress. HENT: Normocephalic, atraumatic, External right and left ear normal. Oropharynx is clear and moist.  Eyes: Conjunctivae and EOM are normal. PERRLA, no scleral icterus. Neck: Normal ROM. Neck supple. No JVD. No tracheal deviation. No thyromegaly. CVS: RRR, S1/S2 +, no murmurs, no gallops, no carotid bruit.  Pulmonary: Effort and breath sounds normal, no stridor, rhonchi, wheezes, rales.  Abdominal: Soft. BS +, no distension, tenderness, rebound or guarding.  Musculoskeletal: Normal range of motion. No edema and no tenderness.  Lymphadenopathy: No lymphadenopathy noted, cervical, inguinal or axillary Neuro: Alert. Normal reflexes, muscle tone coordination. No cranial nerve deficit. Skin: Skin is warm and dry. No rash noted. Not  diaphoretic. No erythema. No pallor. Psychiatric: Normal mood and affect. Behavior, judgment, thought content normal.  Lab Results  Component Value Date   WBC 6.0 04/02/2014   HGB 14.6 04/02/2014   HCT 43.0 04/02/2014   MCV 91.9 04/02/2014   PLT 201 04/02/2014   Lab Results  Component Value Date   CREATININE 1.07 03/28/2014   BUN 7 03/28/2014   NA 137 03/28/2014   K 4.3 03/28/2014   CL 103 03/28/2014   CO2 28 03/28/2014    No results found for: HGBA1C Lipid Panel     Component Value Date/Time   CHOL 210* 03/27/2014 0833   TRIG 62 03/27/2014 0833   HDL 64 03/27/2014 0833   CHOLHDL 3.3 03/27/2014 0833   VLDL 12 03/27/2014 0833   LDLCALC 134* 03/27/2014 0833       Assessment and plan:   1. NSTEMI (non-ST elevated myocardial infarction) Continue present medications as prescribed including ASA beta blocker, ACE inhibitor, statin and Brilinta Patient was counseled extensively about risk factors and their modifications  2. Follow up Blood pressure is at goal, he is currently on Coreg 12.5 mg tablet by mouth twice a day. Continue  follow-up with cardiologist Continue medications Continue Coumadin management.  3. Cigarette nicotine dependence with withdrawal  - Patient was encouraged to continue complete smoking cessation  - Patient requested the help of Chantix because he continues to have intense urge to smoke especially after dinner although he has not given in to temptation   - varenicline (CHANTIX STARTING MONTH PAK) 0.5 MG X 11 & 1 MG X 42 tablet; Take one 0.5 mg tablet by mouth once daily for 3 days, then increase to one 0.5 mg tablet twice daily for 4 days, then increase to one 1 mg tablet twice daily.  Dispense: 53 tablet; Refill: 0  Return in about 3 months (around 08/13/2014) for Annual Physical, Follow up HTN.  The patient was given clear instructions to go to ER or return to medical center if symptoms don't improve, worsen or new problems develop. The patient  verbalized understanding. The patient was told to call to get lab results if they haven't heard anything in the next week.     This note has been created with Education officer, environmental. Any transcriptional errors are unintentional.    Jeanann Lewandowsky, MD, MHA, FACP, FAAP [facility] [place], [place] [phone]   05/13/2014, 12:24 PM

## 2014-05-13 NOTE — Progress Notes (Signed)
Establish Care HFU MI  No pain today Former tobacco user, last cigarette on March 16,2016

## 2014-05-13 NOTE — Patient Instructions (Addendum)
Warfarin tablets What is this medicine? WARFARIN (WAR far in) is an anticoagulant. It is used to treat or prevent clots in the veins, arteries, lungs, or heart. This medicine may be used for other purposes; ask your health care provider or pharmacist if you have questions. COMMON BRAND NAME(S): Coumadin, Jantoven What should I tell my health care provider before I take this medicine? They need to know if you have any of these conditions: -alcoholism -anemia -bleeding disorders -cancer -diabetes -heart disease -high blood pressure -history of bleeding in the gastrointestinal tract -history of stroke or other brain injury or disease -kidney or liver disease -protein C deficiency -protein S deficiency -psychosis or dementia -recent injury, recent or planned surgery or procedure -an unusual or allergic reaction to warfarin, other medicines, foods, dyes, or preservatives -pregnant or trying to get pregnant -breast-feeding How should I use this medicine? Take this medicine by mouth with a glass of water. Follow the directions on the prescription label. You can take this medicine with or without food. Take your medicine at the same time each day. Do not take it more often than directed. Do not stop taking except on your doctor's advice. Stopping this medicine may increase your risk of a blood clot. Be sure to refill your prescription before you run out of medicine. If your doctor or healthcare professional calls to change your dose, write down the dose and any other instructions. Always read the dose and instructions back to him or her to make sure you understand them. Tell your doctor or healthcare professional what strength of tablets you have on hand. Ask how many tablets you should take to equal your new dose. Write the date on the new instructions and keep them near your medicine. If you are told to stop taking your medicine until your next blood test, call your doctor or healthcare  professional if you do not hear anything within 24 hours of the test to find out your new dose or when to restart your prior dose. A special MedGuide will be given to you by the pharmacist with each prescription and refill. Be sure to read this information carefully each time. Talk to your pediatrician regarding the use of this medicine in children. Special care may be needed. Overdosage: If you think you have taken too much of this medicine contact a poison control center or emergency room at once. NOTE: This medicine is only for you. Do not share this medicine with others. What if I miss a dose? It is important not to miss a dose. If you miss a dose, call your healthcare provider. Take the dose as soon as possible on the same day. If it is almost time for your next dose, take only that dose. Do not take double or extra doses to make up for a missed dose. What may interact with this medicine? Do not take this medicine with any of the following medications: -agents that prevent or dissolve blood clots -aspirin or other salicylates -danshen -dextrothyroxine -mifepristone -St. John's Wort -red yeast rice This medicine may also interact with the following medications: -acetaminophen -agents that lower cholesterol -alcohol -allopurinol -amiodarone -antibiotics or medicines for treating bacterial, fungal or viral infections -azathioprine -barbiturate medicines for inducing sleep or treating seizures -certain medicines for diabetes -certain medicines for heart rhythm problems -certain medicines for high blood pressure -chloral hydrate -cisapride -disulfiram -male hormones, including contraceptive or birth control pills -general anesthetics -herbal or dietary products like garlic, ginkgo, ginseng, green tea, or kava  kava -influenza virus vaccine -male hormones -medicines for mental depression or psychosis -medicines for some types of cancer -medicines for stomach  problems -methylphenidate -NSAIDs, medicines for pain and inflammation, like ibuprofen or naproxen -propoxyphene -quinidine, quinine -raloxifene -seizure or epilepsy medicine like carbamazepine, phenytoin, and valproic acid -steroids like cortisone and prednisone -tamoxifen -thyroid medicine -tramadol -vitamin c, vitamin e, and vitamin K -zafirlukast -zileuton This list may not describe all possible interactions. Give your health care provider a list of all the medicines, herbs, non-prescription drugs, or dietary supplements you use. Also tell them if you smoke, drink alcohol, or use illegal drugs. Some items may interact with your medicine. What should I watch for while using this medicine? Visit your doctor or health care professional for regular checks on your progress. You will need to have a blood test called a PT/INR regularly. The PT/INR blood test is done to make sure you are getting the right dose of this medicine. It is important to not miss your appointment for the blood tests. When you first start taking this medicine, these tests are done often. Once the correct dose is determined and you take your medicine properly, these tests can be done less often. Notify your doctor or health care professional and seek emergency treatment if you develop breathing problems; changes in vision; chest pain; severe, sudden headache; pain, swelling, warmth in the leg; trouble speaking; sudden numbness or weakness of the face, arm or leg. These can be signs that your condition has gotten worse. While you are taking this medicine, carry an identification card with your name, the name and dose of medicine(s) being used, and the name and phone number of your doctor or health care professional or person to contact in an emergency. Do not start taking or stop taking any medicines or over-the-counter medicines except on the advice of your doctor or health care professional. You should discuss your diet with  your doctor or health care professional. Do not make major changes in your diet. Vitamin K can affect how well this medicine works. Many foods contain vitamin K. It is important to eat a consistent amount of foods with vitamin K. Other foods with vitamin K that you should eat in consistent amounts are asparagus, basil, beef or pork liver, black eyed peas, broccoli, brussel sprouts, cabbage, chickpeas, cucumber with peel, green onions, green tea, okra, parsley, peas, thyme, and green leafy vegetables like beet greens, collard greens, endive, kale, mustard greens, spinach, turnip greens, watercress, or certain lettuces like green leaf or romaine. This medicine can cause birth defects or bleeding in an unborn child. Women of childbearing age should use effective birth control while taking this medicine. If a woman becomes pregnant while taking this medicine, she should discuss the potential risks and her options with her health care professional. Avoid sports and activities that might cause injury while you are using this medicine. Severe falls or injuries can cause unseen bleeding. Be careful when using sharp tools or knives. Consider using an Copy. Take special care brushing or flossing your teeth. Report any injuries, bruising, or red spots on the skin to your doctor or health care professional. If you have an illness that causes vomiting, diarrhea, or fever for more than a few days, contact your doctor. Also check with your doctor if you are unable to eat for several days. These problems can change the effect of this medicine. Even after you stop taking this medicine, it takes several days before your body recovers  its normal ability to clot blood. Ask your doctor or health care professional how long you need to be careful. If you are going to have surgery or dental work, tell your doctor or health care professional that you have been taking this medicine. What side effects may I notice from  receiving this medicine? Side effects that you should report to your doctor or health care professional as soon as possible: -back pain -chills -dizziness -fever -heavy menstrual bleeding or vaginal bleeding -painful, blue, or purple toes -painful, prolonged erection -signs and symptoms of bleeding such as bloody or black, tarry stools; red or dark-brown urine; spitting up blood or brown material that looks like coffee grounds; red spots on the skin; unusual bruising or bleeding from the eye, gums, or nose-skin rash, itching or skin damage -stomach pain -unusually weak or tired -yellowing of skin or eyes Side effects that usually do not require medical attention (report to your doctor or health care professional if they continue or are bothersome): -diarrhea -hair loss This list may not describe all possible side effects. Call your doctor for medical advice about side effects. You may report side effects to FDA at 1-800-FDA-1088. Where should I keep my medicine? Keep out of the reach of children. Store at room temperature between 15 and 30 degrees C (59 and 86 degrees F). Protect from light. Throw away any unused medicine after the expiration date. Do not flush down the toilet. NOTE: This sheet is a summary. It may not cover all possible information. If you have questions about this medicine, talk to your doctor, pharmacist, or health care provider.  2015, [employer] Standard. (2012-07-18 12:17:56) Cardiomyopathy Cardiomyopathy means a disease of the heart muscle. The heart muscle becomes enlarged or stiff. The heart is not able to pump enough blood or deliver enough oxygen to the body. This leads to heart failure and is the number one reason for heart transplants.  TYPES OF CARDIOMYOPATHY INCLUDE: DILATED  The most common type. The heart muscle is stretched out and weak so there is less blood pumped out.   Some causes:  Disease of the arteries of the heart (ischemia).  Heart  attack with muscle scar.  Leaky or damaged valves.  After a viral illness.  Smoking.  High cholesterol.  Diabetes or overactive thyroid.  Alcohol or drug abuse.  High blood pressure.  May be reversible. HYPERTROPHIC The heart muscle grows bigger so there is less room for blood in the ventricle, and not enough blood is pumped out.   Causes include:  Mitral valve leaks.  Inherited tendency (from your family).  No explanation (idiopathic).  May be a cause of sudden death in [place] athletes with no symptoms. RESTRICTIVE The heart muscle becomes stiff, but not always larger. The heart has to work harder and will get weaker. Abnormal heart beats or rhythm (arrhythmia) are common.  Some causes:  Diseases in other parts of the body which may produce abnormal deposits in the heart muscle.  Probably not inherited.  A result of radiation treatment for cancer. SYMPTOMS OF ALL TYPES:  Less able to exercise or tolerate physical activity.  Palpitations.  Irregular heart beat, heart arrhythmias.  Shortness of breath, even at rest.  Chest pain.  Lightheadedness or fainting. TREATMENT  Life-style changes including reducing salt, lowering cholesterol, stop smoking.  Manage contributing causes with medications.  Medicines to help reduce the fluids in the body.  An implanted cardioverter defibrillator (ICD) to improve heart function and correct arrhythmias.  Medications  to relax the blood vessels and make it easier for the heart to pump.  Drugs that help regulate heart beat and improve heart relaxation, reducing the work of the heart.  Myomectomy for patients with hypertrophic cardiomyopathy and severe problems. This is a surgical procedure that removes a portion of the thickened muscle wall in order to improve heart output and provide symptom relief.  A heart transplant is an option in carefully applied circumstances. SEEK IMMEDIATE MEDICAL CARE IF:   You have severe  chest pain, especially if the pain is crushing or pressure-like and spreads to the arms, back, neck, or jaw, or if you have sweating, feeling sick to your stomach (nausea), or shortness of breath. THIS IS AN EMERGENCY. Do not wait to see if the pain will go away. Get medical help at once. Call your local emergency services (911 in [place] DO NOT drive yourself to the hospital.  You develop severe shortness of breath.  You begin to cough up bloody sputum.  You are unable to sleep because you cannot breathe.  You gain weight due to fluid retention.  You develop painful swelling in your calf or leg.  You feel your heart racing and it does not go away or happens when you are resting. Document Released: 03/11/2004 Document Revised: 03/21/2011 Document Reviewed: 08/15/2007 [facility] Patient Information 2015 [place], [place]. This information is not intended to replace advice given to you by your health care provider. Make sure you discuss any questions you have with your health care provider. Smoking Cessation Quitting smoking is important to your health and has many advantages. However, it is not always easy to quit since nicotine is a very addictive drug. Oftentimes, people try 3 times or more before being able to quit. This document explains the best ways for you to prepare to quit smoking. Quitting takes hard work and a lot of effort, but you can do it. ADVANTAGES OF QUITTING SMOKING  You will live longer, feel better, and live better.  Your body will feel the impact of quitting smoking almost immediately.  Within 20 minutes, blood pressure decreases. Your pulse returns to its normal level.  After 8 hours, carbon monoxide levels in the blood return to normal. Your oxygen level increases.  After 24 hours, the chance of having a heart attack starts to decrease. Your breath, hair, and body stop smelling like smoke.  After 48 hours, damaged nerve endings begin to recover. Your sense of taste and  smell improve.  After 72 hours, the body is virtually free of nicotine. Your bronchial tubes relax and breathing becomes easier.  After 2 to 12 weeks, lungs can hold more air. Exercise becomes easier and circulation improves.  The risk of having a heart attack, stroke, cancer, or lung disease is greatly reduced.  After 1 year, the risk of coronary heart disease is cut in half.  After 5 years, the risk of stroke falls to the same as a nonsmoker.  After 10 years, the risk of lung cancer is cut in half and the risk of other cancers decreases significantly.  After 15 years, the risk of coronary heart disease drops, usually to the level of a nonsmoker.  If you are pregnant, quitting smoking will improve your chances of having a healthy baby.  The people you live with, especially any children, will be healthier.  You will have extra money to spend on things other than cigarettes. QUESTIONS TO THINK ABOUT BEFORE ATTEMPTING TO QUIT You may want to talk  about your answers with your health care provider.  Why do you want to quit?  If you tried to quit in the past, what helped and what did not?  What will be the most difficult situations for you after you quit? How will you plan to handle them?  Who can help you through the tough times? Your family? Friends? A health care provider?  What pleasures do you get from smoking? What ways can you still get pleasure if you quit? Here are some questions to ask your health care provider:  How can you help me to be successful at quitting?  What medicine do you think would be best for me and how should I take it?  What should I do if I need more help?  What is smoking withdrawal like? How can I get information on withdrawal? GET READY  Set a quit date.  Change your environment by getting rid of all cigarettes, ashtrays, matches, and lighters in your home, car, or work. Do not let people smoke in your home.  Review your past attempts to quit.  Think about what worked and what did not. GET SUPPORT AND ENCOURAGEMENT You have a better chance of being successful if you have help. You can get support in many ways.  Tell your family, friends, and coworkers that you are going to quit and need their support. Ask them not to smoke around you.  Get individual, group, or telephone counseling and support. Programs are available at [employer] and health centers. Call your local health department for information about programs in your area.  Spiritual beliefs and practices may help some smokers quit.  Download a "quit meter" on your computer to keep track of quit statistics, such as how long you have gone without smoking, cigarettes not smoked, and money saved.  Get a self-help book about quitting smoking and staying off tobacco. LEARN NEW SKILLS AND BEHAVIORS  Distract yourself from urges to smoke. Talk to someone, go for a walk, or occupy your time with a task.  Change your normal routine. Take a different route to work. Drink tea instead of coffee. Eat breakfast in a different place.  Reduce your stress. Take a hot bath, exercise, or read a book.  Plan something enjoyable to do every day. Reward yourself for not smoking.  Explore interactive web-based programs that specialize in helping you quit. GET MEDICINE AND USE IT CORRECTLY Medicines can help you stop smoking and decrease the urge to smoke. Combining medicine with the above behavioral methods and support can greatly increase your chances of successfully quitting smoking.  Nicotine replacement therapy helps deliver nicotine to your body without the negative effects and risks of smoking. Nicotine replacement therapy includes nicotine gum, lozenges, inhalers, nasal sprays, and skin patches. Some may be available over-the-counter and others require a prescription.  Antidepressant medicine helps people abstain from smoking, but how this works is unknown. This medicine is available  by prescription.  Nicotinic receptor partial agonist medicine simulates the effect of nicotine in your brain. This medicine is available by prescription. Ask your health care provider for advice about which medicines to use and how to use them based on your health history. Your health care provider will tell you what side effects to look out for if you choose to be on a medicine or therapy. Carefully read the information on the package. Do not use any other product containing nicotine while using a nicotine replacement product.  RELAPSE OR DIFFICULT SITUATIONS Most  relapses occur within the first 3 months after quitting. Do not be discouraged if you start smoking again. Remember, most people try several times before finally quitting. You may have symptoms of withdrawal because your body is used to nicotine. You may crave cigarettes, be irritable, feel very hungry, cough often, get headaches, or have difficulty concentrating. The withdrawal symptoms are only temporary. They are strongest when you first quit, but they will go away within 10-14 days. To reduce the chances of relapse, try to:  Avoid drinking alcohol. Drinking lowers your chances of successfully quitting.  Reduce the amount of caffeine you consume. Once you quit smoking, the amount of caffeine in your body increases and can give you symptoms, such as a rapid heartbeat, sweating, and anxiety.  Avoid smokers because they can make you want to smoke.  Do not let weight gain distract you. Many smokers will gain weight when they quit, usually less than 10 pounds. Eat a healthy diet and stay active. You can always lose the weight gained after you quit.  Find ways to improve your mood other than smoking. FOR MORE INFORMATION  www.smokefree.gov  Document Released: 12/21/2000 Document Revised: 05/13/2013 Document Reviewed: 04/07/2011 [facility] Patient Information 2015 [place], [place]. This information is not intended to replace advice given to  you by your health care provider. Make sure you discuss any questions you have with your health care provider.

## 2014-05-23 ENCOUNTER — Other Ambulatory Visit (HOSPITAL_COMMUNITY): Payer: Self-pay

## 2014-05-26 ENCOUNTER — Ambulatory Visit (HOSPITAL_COMMUNITY): Payer: 59 | Attending: Cardiovascular Disease

## 2014-05-26 ENCOUNTER — Ambulatory Visit (INDEPENDENT_AMBULATORY_CARE_PROVIDER_SITE_OTHER): Payer: 59 | Admitting: *Deleted

## 2014-05-26 DIAGNOSIS — I10 Essential (primary) hypertension: Secondary | ICD-10-CM | POA: Insufficient documentation

## 2014-05-26 DIAGNOSIS — Z87891 Personal history of nicotine dependence: Secondary | ICD-10-CM | POA: Diagnosis not present

## 2014-05-26 DIAGNOSIS — I255 Ischemic cardiomyopathy: Secondary | ICD-10-CM | POA: Diagnosis present

## 2014-05-26 DIAGNOSIS — I214 Non-ST elevation (NSTEMI) myocardial infarction: Secondary | ICD-10-CM

## 2014-05-26 DIAGNOSIS — Z5181 Encounter for therapeutic drug level monitoring: Secondary | ICD-10-CM

## 2014-05-26 DIAGNOSIS — I213 ST elevation (STEMI) myocardial infarction of unspecified site: Secondary | ICD-10-CM | POA: Diagnosis not present

## 2014-05-26 DIAGNOSIS — E785 Hyperlipidemia, unspecified: Secondary | ICD-10-CM | POA: Insufficient documentation

## 2014-05-26 DIAGNOSIS — I2510 Atherosclerotic heart disease of native coronary artery without angina pectoris: Secondary | ICD-10-CM

## 2014-05-26 DIAGNOSIS — I513 Intracardiac thrombosis, not elsewhere classified: Secondary | ICD-10-CM

## 2014-05-26 LAB — POCT INR: INR: 2.4

## 2014-05-26 MED ORDER — PERFLUTREN LIPID MICROSPHERE
3.0000 mL | Freq: Once | INTRAVENOUS | Status: AC
  Administered 2014-05-26: 3 mL via INTRAVENOUS

## 2014-05-29 ENCOUNTER — Ambulatory Visit (INDEPENDENT_AMBULATORY_CARE_PROVIDER_SITE_OTHER): Payer: 59 | Admitting: Interventional Cardiology

## 2014-05-29 ENCOUNTER — Encounter: Payer: Self-pay | Admitting: Interventional Cardiology

## 2014-05-29 VITALS — BP 115/82 | HR 83 | Ht 73.0 in | Wt 226.0 lb

## 2014-05-29 DIAGNOSIS — F17213 Nicotine dependence, cigarettes, with withdrawal: Secondary | ICD-10-CM | POA: Diagnosis not present

## 2014-05-29 DIAGNOSIS — Z720 Tobacco use: Secondary | ICD-10-CM | POA: Diagnosis not present

## 2014-05-29 DIAGNOSIS — I513 Intracardiac thrombosis, not elsewhere classified: Secondary | ICD-10-CM

## 2014-05-29 DIAGNOSIS — N529 Male erectile dysfunction, unspecified: Secondary | ICD-10-CM

## 2014-05-29 DIAGNOSIS — I213 ST elevation (STEMI) myocardial infarction of unspecified site: Secondary | ICD-10-CM | POA: Diagnosis not present

## 2014-05-29 DIAGNOSIS — E785 Hyperlipidemia, unspecified: Secondary | ICD-10-CM | POA: Diagnosis not present

## 2014-05-29 MED ORDER — SILDENAFIL CITRATE 50 MG PO TABS: 50.0000 mg | ORAL_TABLET | Freq: Every day | ORAL | Status: DC | PRN

## 2014-05-29 NOTE — Progress Notes (Signed)
Patient ID: Thomas Woods, male   DOB: May 28, 1965, 49 y.o.   MRN: [id number]     Cardiology Office Note   Date:  05/29/2014   ID:  Thomas Woods, DOB 1965/06/19, MRN [id number]  PCP:  Jeanann Lewandowsky, MD    No chief complaint on file. f/u MI   Wt Readings from Last 3 Encounters:  05/29/14 226 lb (102.513 kg)  05/13/14 224 lb (101.606 kg)  04/23/14 221 lb 6.4 oz (100.426 kg)       History of Present Illness: Thomas Woods is a 49 y.o. male  Who had an anterior MI.  His EF was low at 30%.  He had an apical thrombus,  and was treated with warfarin.  He has done well.  He denies any chest pain.  His most recent EF is now low normal.  He has been wearing a life vest.  It has not fired.  He has smoked a few cigarettes.    He is having issues with erectile dysfunction and wants to   Use Viagra.  He is having a craving for cigarettes and wants samples of Chantix.  Overall, he feels well with the lifestyle change.   He wants to continue eating healthy, abstaining from cigarettes. He also wants to try to exercise. He is hoping that he will be approved for cardiac rehabilitation.    Past Medical History  Diagnosis Date  . Hypertension   . NSTEMI (non-ST elevated myocardial infarction) 03/26/2014    Patient pain increased and he developed ECG changes, taken to the cath lab as a STEMI, PCI to the LAD  . Ischemic cardiomyopathy 03/27/2014    EF 30% at cath, confirmed by echo  . 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 Multiple lung nodules on CT 03/28/2014    Right lung nodules up to 6 mm, repeat CT in 6-12 months  . Hyperlipidemia LDL goal <70 03/27/2014    Past Surgical History  Procedure Laterality Date  . Hernia repair    . Left heart catheterization with coronary angiogram N/A 03/27/2014    Procedure: LEFT HEART CATHETERIZATION WITH CORONARY ANGIOGRAM;  Surgeon: Corky Crafts, MD; L main okay, LAD 100%, CFX okay, RI moderate disease, OM1 mild disease,  RCA moderate disease, EF 30%  . Percutaneous coronary stent intervention (pci-s)  03/27/2014    3.0 x 32 Synergy overlapped with 2.5 x 38 Synergy to the mLAD     Current Outpatient Prescriptions  Medication Sig Dispense Refill  . aspirin 81 MG tablet Take 1 tablet (81 mg total) by mouth daily. 30 tablet 0  . atorvastatin (LIPITOR) 80 MG tablet Take 1 tablet (80 mg total) by mouth daily. 30 tablet 11  . carvedilol (COREG) 12.5 MG tablet Take 1 tablet (12.5 mg total) by mouth 2 (two) times daily with a meal. 60 tablet 11  . lisinopril (PRINIVIL,ZESTRIL) 5 MG tablet Take 1 tablet (5 mg total) by mouth daily. 30 tablet 11  . Multiple Vitamin (MULTIVITAMIN WITH MINERALS) TABS tablet Take 1 tablet by mouth daily.    . nicotine (NICODERM CQ - DOSED IN MG/24 HOURS) 14 mg/24hr patch Place 1 patch (14 mg total) onto the skin daily. 14 patch 0  . nitroGLYCERIN (NITROSTAT) 0.4 MG SL tablet Place 0.4 mg under the tongue every 5 (five) minutes as needed for chest pain.    . ticagrelor (BRILINTA) 90 MG TABS tablet Take 1 tablet (90 mg total) by mouth 2 (two)  times daily. 180 tablet 3  . varenicline (CHANTIX STARTING MONTH PAK) 0.5 MG X 11 & 1 MG X 42 tablet Take one 0.5 mg tablet by mouth once daily for 3 days, then increase to one 0.5 mg tablet twice daily for 4 days, then increase to one 1 mg tablet twice daily. 53 tablet 0  . warfarin (COUMADIN) 5 MG tablet Take 1-2.5 tablets (5-12.5 mg total) by mouth daily. Start with 12.5 mg daily, then per [facility] 75 tablet 11   No current facility-administered medications for this visit.    Allergies:   Review of patient's allergies indicates no known allergies.    Social History:  The patient  reports that he quit smoking about 2 months ago. His smoking use included Cigarettes. He smoked 0.50 packs per day. He does not have any smokeless tobacco history on file. He reports that he drinks alcohol. He reports that he does not use illicit drugs.   Family  History:  The patient's *family history includes Healthy in his brother, mother, and sister.    ROS:  Please see the history of present illness.   Otherwise, review of systems are positive for mild SHOB with walking.   All other systems are reviewed and negative.    PHYSICAL EXAM: VS:  BP 115/82 mmHg  Pulse 83  Ht 6\' 1"  (1.854 m)  Wt 226 lb (102.513 kg)  BMI 29.82 kg/m2  SpO2 97% , BMI Body mass index is 29.82 kg/(m^2). GEN: Well nourished, well developed, in no acute distress HEENT: normal Neck: no JVD, carotid bruits, or masses Cardiac: *RRR; no murmurs, rubs, or gallops,no edema  Respiratory:  clear to auscultation bilaterally, normal work of breathing GI: soft, nontender, nondistended, + BS MS: no deformity or atrophy Skin: warm and dry, no rash Neuro:  Strength and sensation are intact Psych: euthymic mood, full affect     Recent Labs: 03/26/2014: B Natriuretic Peptide 48.4 03/27/2014: TSH 0.769 03/28/2014: ALT 23; BUN 7; Creatinine 1.07; Potassium 4.3; Sodium 137 04/02/2014: Hemoglobin 14.6; Platelets 201   Lipid Panel    Component Value Date/Time   CHOL 210* 03/27/2014 0833   TRIG 62 03/27/2014 0833   HDL 64 03/27/2014 0833   CHOLHDL 3.3 03/27/2014 0833   VLDL 12 03/27/2014 0833   LDLCALC 134* 03/27/2014 0833     Other studies Reviewed: Additional studies/ records that were reviewed today with results demonstrating: cath report, LAD DES.   ASSESSMENT AND PLAN:  1. Anterior MI /CAD: Continue DAPT for 12 months,  He has been taking COumadin for apical thrombus,  THis has resolved by echo, but will continue Coumadin for another month.  THen stop warfarin on June 30, 2014.  That will be 3 months of anticoagulation. His ultrasound a few days ago was much improved.  His ejection fraction is improved markedly. I informed him if he does not have to wear the LifeVest any longer. 2. Hyperlipidemia: LDL 134 in 3/16.  Recheck in 6/16.  LDL was above target at the time of the  MI. He has been on high-dose statin. 3. CAD: No angina currently.  Continue aggressive secondary prevention. I personally reviewed his cath report. He has residual disease in the RCA and ramus systems of moderate degree. Since he is not having any discrete angina, would hold off on any type of intervention.  4. Tobacco abuse: He will restart Chantix.  He is trying to obtain this from his primary care doctor. Unfortunately, we don't have any samples  of this medicine. 5. Erectile dysfunction: He wants an Rx of Viagra.  Will give prescription for 50 mg tablets. He is not using any nitroglycerin. This problem started before his MI. 6. He previously was working as a Scientist, forensicmover. He is not sure that he would be able to go back to this type of activity. For now, we'll have him increase his activity slowly. Hopefully he will be able to do cardiac rehabilitation. His LV function has improved drastically.   Current medicines are reviewed at length with the patient today.  The patient concerns regarding his medicines were addressed.  The following changes have been made:  No change  Labs/ tests ordered today include: lipids No orders of the defined types were placed in this encounter.    Recommend 150 minutes/week of aerobic exercise Low fat, low carb, high fiber diet recommended  Disposition:   FU in 2 months   Delorise JacksonSigned, Azul Brumett S., MD  05/29/2014 3:45 PM    [facility]: [facility] [address], [place], [place]  [address] Phone: [phone]; Fax: [phone]

## 2014-05-29 NOTE — Patient Instructions (Signed)
**Note De-Identified Salvador [place]bee Obfuscation** Medication Instructions:  Stop taking Warfarin(Coumadin) on 06/30/14.  Labwork: Lipids/LFTs  Testing/Procedures: None  Follow-Up: Your physician recommends that you schedule a follow-up appointment in: 2 months

## 2014-06-16 ENCOUNTER — Ambulatory Visit (INDEPENDENT_AMBULATORY_CARE_PROVIDER_SITE_OTHER): Payer: 59 | Admitting: Pharmacist

## 2014-06-16 DIAGNOSIS — Z5181 Encounter for therapeutic drug level monitoring: Secondary | ICD-10-CM

## 2014-06-16 DIAGNOSIS — I214 Non-ST elevation (NSTEMI) myocardial infarction: Secondary | ICD-10-CM | POA: Diagnosis not present

## 2014-06-16 DIAGNOSIS — I513 Intracardiac thrombosis, not elsewhere classified: Secondary | ICD-10-CM

## 2014-06-16 DIAGNOSIS — I213 ST elevation (STEMI) myocardial infarction of unspecified site: Secondary | ICD-10-CM

## 2014-06-16 LAB — POCT INR: INR: 3.3

## 2014-06-23 ENCOUNTER — Other Ambulatory Visit (INDEPENDENT_AMBULATORY_CARE_PROVIDER_SITE_OTHER): Payer: 59 | Admitting: *Deleted

## 2014-06-23 ENCOUNTER — Telehealth: Payer: Self-pay | Admitting: Interventional Cardiology

## 2014-06-23 ENCOUNTER — Ambulatory Visit: Payer: 59

## 2014-06-23 DIAGNOSIS — E785 Hyperlipidemia, unspecified: Secondary | ICD-10-CM | POA: Diagnosis not present

## 2014-06-23 LAB — LIPID PANEL
CHOL/HDL RATIO: 2
Cholesterol: 140 mg/dL (ref 0–200)
HDL: 58.8 mg/dL (ref >39.00)
LDL Cholesterol: 68 mg/dL (ref 0–99)
NONHDL: 81.2
Triglycerides: 64 mg/dL (ref 0.0–149.0)
VLDL: 12.8 mg/dL (ref 0.0–40.0)

## 2014-06-23 LAB — HEPATIC FUNCTION PANEL
ALK PHOS: 43 U/L (ref 39–117)
ALT: 21 U/L (ref 0–53)
AST: 25 U/L (ref 0–37)
Albumin: 4.6 g/dL (ref 3.5–5.2)
BILIRUBIN DIRECT: 0.2 mg/dL (ref 0.0–0.3)
Total Bilirubin: 0.9 mg/dL (ref 0.2–1.2)
Total Protein: 7.7 g/dL (ref 6.0–8.3)

## 2014-06-23 NOTE — Telephone Encounter (Signed)
Pt in today to drop off paperwork to [employer] and asked to speak with me. Pt wanted to make Dr. Eldridge Dace aware that he has been having swelling in his right ankle and calf. Pt complains of SHOB on exertion as well. Pt denies CP, dizziness, lightheadedness. Pt also c/o continuous pain in his right shoulder blade Pt states that he take his meds as prescribed. Pt also complains of bruising after bumping into things. Informed pt that bruising can occur when on Coumadin. Pt has appt with Dr. Eldridge Dace on 7/20 and would like to know if he needs to be seen sooner since he is having the swelling and SHOB. Informed pt that I would route this information to Dr. Eldridge Dace for review and advisement. Pt states that he would like to speak with Dr. Eldridge Dace directly if he could or that I could just call him with recommendations from Dr. Eldridge Dace.  Pt provided me with the best numbers to reach him at [phone] and [phone].

## 2014-06-26 ENCOUNTER — Ambulatory Visit: Payer: 59

## 2014-06-27 NOTE — Telephone Encounter (Signed)
Spoke with pt and informed him that Dr. Eldridge Dace said it was unlikely that he has a clot in his leg while on Coumadin. Informed pt to elevate his leg at night and pt states that he has been doing that for 2 weeks with no improvement. Pt states that in the morning when he gets up there is pain in his right leg. Also states that with the pain in the leg and the [facility] that he doesn't feel like he can work. Pt states that he is applying for disability and would like to know if Dr. Eldridge Dace could write a letter to help with him applying to disability. Informed pt that we also needed to get an echo scheduled since stents were placed in March. Pt verbalized understanding and asked that I please make Dr. Eldridge Dace aware of this information. Pt states that he would really like to see Dr. Eldridge Dace prior to 07/30/14 if anything becomes available. Will forward to Dr. Eldridge Dace for review.

## 2014-06-27 NOTE — Telephone Encounter (Signed)
It would be unlikely that he has a clot in his legs while on COumadin.  He should elevate the swollen leg at night.  Can check BNP.  He should be scheduled for repeat echo about 3 months after his stent was placed.

## 2014-06-30 ENCOUNTER — Other Ambulatory Visit: Payer: Self-pay | Admitting: *Deleted

## 2014-06-30 DIAGNOSIS — R0602 Shortness of breath: Secondary | ICD-10-CM

## 2014-06-30 DIAGNOSIS — I25810 Atherosclerosis of coronary artery bypass graft(s) without angina pectoris: Secondary | ICD-10-CM

## 2014-06-30 NOTE — Telephone Encounter (Signed)
Is his disability from leg pain?  If that is the case, he should have an evaluation from his PCP and possibly an orthopedist to look for a cause of pain.  From a cardiac standpoint, he would need to be evaluated with echo 3 months after his revascularization to determine whether his heart would contribute to this and to see how much recovery of function that he has.

## 2014-07-01 ENCOUNTER — Encounter (HOSPITAL_COMMUNITY): Payer: Self-pay | Admitting: *Deleted

## 2014-07-01 NOTE — Telephone Encounter (Signed)
Left message to call back  

## 2014-07-02 NOTE — Telephone Encounter (Signed)
Spoke with pt and informed him of information provided by Dr. Eldridge Dace. Informed pt that from a cardiac standpoint he would need to be evaluated with an echo 3 months after his revascularization to determine whether his heart contributes to this and to see how much recovery of function that he has. Pt scheduled for echo on Monday. Pt in agreement to see what echo shows on Monday.

## 2014-07-07 ENCOUNTER — Other Ambulatory Visit: Payer: Self-pay

## 2014-07-07 ENCOUNTER — Ambulatory Visit (HOSPITAL_COMMUNITY): Payer: 59 | Attending: Cardiovascular Disease

## 2014-07-07 ENCOUNTER — Ambulatory Visit: Payer: 59 | Admitting: *Deleted

## 2014-07-07 ENCOUNTER — Other Ambulatory Visit (INDEPENDENT_AMBULATORY_CARE_PROVIDER_SITE_OTHER): Payer: 59 | Admitting: *Deleted

## 2014-07-07 DIAGNOSIS — Z5181 Encounter for therapeutic drug level monitoring: Secondary | ICD-10-CM

## 2014-07-07 DIAGNOSIS — I25810 Atherosclerosis of coronary artery bypass graft(s) without angina pectoris: Secondary | ICD-10-CM | POA: Diagnosis not present

## 2014-07-07 DIAGNOSIS — R0602 Shortness of breath: Secondary | ICD-10-CM

## 2014-07-07 DIAGNOSIS — I214 Non-ST elevation (NSTEMI) myocardial infarction: Secondary | ICD-10-CM

## 2014-07-07 DIAGNOSIS — I513 Intracardiac thrombosis, not elsewhere classified: Secondary | ICD-10-CM

## 2014-07-07 DIAGNOSIS — I351 Nonrheumatic aortic (valve) insufficiency: Secondary | ICD-10-CM | POA: Insufficient documentation

## 2014-07-11 ENCOUNTER — Other Ambulatory Visit: Payer: Self-pay

## 2014-07-11 ENCOUNTER — Telehealth: Payer: Self-pay | Admitting: Interventional Cardiology

## 2014-07-11 LAB — BRAIN NATRIURETIC PEPTIDE: Pro B Natriuretic peptide (BNP): 31 pg/mL (ref 0.0–100.0)

## 2014-07-11 NOTE — Telephone Encounter (Signed)
Call left message for patient to call office.

## 2014-07-11 NOTE — Telephone Encounter (Signed)
Wife is aware of ECHO results Mylo Redebbie Leialoha Hanna RN

## 2014-07-11 NOTE — Telephone Encounter (Addendum)
New Message   Pt is returning Rn call, please call him back, and if his wife answers he states that he   Gives RN permission to speak to his wife because he is out of phone minutes.

## 2014-07-21 ENCOUNTER — Telehealth: Payer: Self-pay | Admitting: Interventional Cardiology

## 2014-07-21 NOTE — Telephone Encounter (Signed)
New message      Pt got a letter from [employer] stating that his viagra will be shipped here.  Please call when we get it

## 2014-07-30 ENCOUNTER — Encounter: Payer: Self-pay | Admitting: Interventional Cardiology

## 2014-07-30 ENCOUNTER — Ambulatory Visit (INDEPENDENT_AMBULATORY_CARE_PROVIDER_SITE_OTHER): Payer: 59 | Admitting: Interventional Cardiology

## 2014-07-30 VITALS — BP 122/82 | HR 78 | Ht 73.0 in | Wt 220.0 lb

## 2014-07-30 DIAGNOSIS — I2109 ST elevation (STEMI) myocardial infarction involving other coronary artery of anterior wall: Secondary | ICD-10-CM

## 2014-07-30 DIAGNOSIS — Z720 Tobacco use: Secondary | ICD-10-CM | POA: Diagnosis not present

## 2014-07-30 DIAGNOSIS — E785 Hyperlipidemia, unspecified: Secondary | ICD-10-CM

## 2014-07-30 DIAGNOSIS — N529 Male erectile dysfunction, unspecified: Secondary | ICD-10-CM | POA: Diagnosis not present

## 2014-07-30 DIAGNOSIS — F17213 Nicotine dependence, cigarettes, with withdrawal: Secondary | ICD-10-CM | POA: Diagnosis not present

## 2014-07-30 NOTE — Patient Instructions (Signed)
Medication Instructions:  Same-no change  Labwork: None  Testing/Procedures: None  Follow-Up: Your physician wants you to follow-up in: 6 months. You will receive a reminder letter in the mail two months in advance. If you don't receive a letter, please call our office to schedule the follow-up appointment.      

## 2014-07-30 NOTE — Progress Notes (Signed)
Patient ID: Thomas Woods, male   DOB: 05/21/1965, 49 y.o.   MRN: [id number]     Cardiology Office Note   Date:  07/30/2014   ID:  Thomas Woods, DOB 1965/12/05, MRN [id number]  PCP:  Thomas Lewandowsky, MD    No chief complaint on file.    Wt Readings from Last 3 Encounters:  07/30/14 220 lb (99.791 kg)  05/29/14 226 lb (102.513 kg)  05/13/14 224 lb (101.606 kg)       History of Present Illness: Thomas Woods is a 49 y.o. male  Who had an anterior MI.He had 2 long overlapping drug-eluting stents to the LAD. His EF was low at 30%. He had an apical thrombus, and was treated with warfarin. He has done well. He denies any chest pain. His most recent EF is now low normal.  He has smoked a few cigarettes recently, but less than he was doing before.Marland Kitchen   He is having issues with erectile dysfunction and wants to Use Viagra. He is having a craving for cigarettes and wants samples of Chantix.  He feels Chantix helped him a lot in the past. He has applied for patient assistance to get the Viagra.  Overall, he feels well with the lifestyle change. He wants to continue eating healthy, abstaining from cigarettes. He also wants to try to exercise.  He has had some dyspnea on exertion. He had an echocardiogram showing low normal left ventricular ejection fraction. BNP was also normal. He denies any swelling or orthopnea. His biggest concern is about his job. He was working as a Scientist, forensic. He does not think that he will be able to continue with this job as his "boss will not touch him." He is considering applying for disability.    Past Medical History  Diagnosis Date  . Hypertension   . NSTEMI (non-ST elevated myocardial infarction) 03/26/2014    Patient pain increased and he developed ECG changes, taken to the cath lab as a STEMI, PCI to the LAD  . Ischemic cardiomyopathy 03/27/2014    EF 30% at cath, confirmed by echo  . 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  Multiple lung nodules on CT 03/28/2014    Right lung nodules up to 6 mm, repeat CT in 6-12 months  . Hyperlipidemia LDL goal <70 03/27/2014    Past Surgical History  Procedure Laterality Date  . Hernia repair    . Left heart catheterization with coronary angiogram N/A 03/27/2014    Procedure: LEFT HEART CATHETERIZATION WITH CORONARY ANGIOGRAM;  Surgeon: Corky Crafts, MD; L main okay, LAD 100%, CFX okay, RI moderate disease, OM1 mild disease, RCA moderate disease, EF 30%  . Percutaneous coronary stent intervention (pci-s)  03/27/2014    3.0 x 32 Synergy overlapped with 2.5 x 38 Synergy to the mLAD     Current Outpatient Prescriptions  Medication Sig Dispense Refill  . aspirin 81 MG tablet Take 1 tablet (81 mg total) by mouth daily. 30 tablet 0  . atorvastatin (LIPITOR) 80 MG tablet Take 1 tablet (80 mg total) by mouth daily. 30 tablet 11  . carvedilol (COREG) 12.5 MG tablet Take 1 tablet (12.5 mg total) by mouth 2 (two) times daily with a meal. 60 tablet 11  . lisinopril (PRINIVIL,ZESTRIL) 5 MG tablet Take 1 tablet (5 mg total) by mouth daily. 30 tablet 11  . Multiple Vitamin (MULTIVITAMIN WITH MINERALS) TABS tablet Take 1 tablet by mouth daily.    Marland Kitchen  nicotine (NICODERM CQ - DOSED IN MG/24 HOURS) 14 mg/24hr patch Place 1 patch (14 mg total) onto the skin daily. 14 patch 0  . nitroGLYCERIN (NITROSTAT) 0.4 MG SL tablet Place 0.4 mg under the tongue every 5 (five) minutes as needed for chest pain.    . sildenafil (VIAGRA) 50 MG tablet Take 1 tablet (50 mg total) by mouth daily as needed for erectile dysfunction. 10 tablet 3  . ticagrelor (BRILINTA) 90 MG TABS tablet Take 1 tablet (90 mg total) by mouth 2 (two) times daily. 180 tablet 3  . varenicline (CHANTIX STARTING MONTH PAK) 0.5 MG X 11 & 1 MG X 42 tablet Take one 0.5 mg tablet by mouth once daily for 3 days, then increase to one 0.5 mg tablet twice daily for 4 days, then increase to one 1 mg tablet twice daily. 53 tablet 0   No  current facility-administered medications for this visit.    Allergies:   Review of patient's allergies indicates no known allergies.    Social History:  The patient  reports that he quit smoking about 4 months ago. His smoking use included Cigarettes. He smoked 0.50 packs per day. He does not have any smokeless tobacco history on file. He reports that he drinks alcohol. He reports that he does not use illicit drugs.   Family History:  The patient's family history includes Healthy in his brother, mother, and sister.    ROS:  Please see the history of present illness.   Otherwise, review of systems are positive for erectile dysfunction.   All other systems are reviewed and negative.    PHYSICAL EXAM: VS:  BP 122/82 mmHg  Pulse 78  Ht 6\' 1"  (1.854 m)  Wt 220 lb (99.791 kg)  BMI 29.03 kg/m2 , BMI Body mass index is 29.03 kg/(m^2). GEN: Well nourished, well developed, in no acute distress HEENT: normal Neck: no JVD, carotid bruits, or masses Cardiac: RRR; no murmurs, rubs, or gallops,no edema  Respiratory:  clear to auscultation bilaterally, normal work of breathing GI: soft, nontender, nondistended, + BS MS: no deformity or atrophy Skin: warm and dry, no rash Neuro:  Strength and sensation are intact Psych: euthymic mood, full affect    Recent Labs: 03/26/2014: B Natriuretic Peptide 48.4 03/27/2014: TSH 0.769 03/28/2014: BUN 7; Creatinine, Ser 1.07; Potassium 4.3; Sodium 137 04/02/2014: Hemoglobin 14.6; Platelets 201 06/23/2014: ALT 21 07/07/2014: Pro B Natriuretic peptide (BNP) 31.0   Lipid Panel    Component Value Date/Time   CHOL 140 06/23/2014 1140   TRIG 64.0 06/23/2014 1140   HDL 58.80 06/23/2014 1140   CHOLHDL 2 06/23/2014 1140   VLDL 12.8 06/23/2014 1140   LDLCALC 68 06/23/2014 1140     Other studies Reviewed: Additional studies/ records that were reviewed today with results demonstrating: Cath report showing too long drug-eluting stents deployed in the  LAD.   ASSESSMENT AND PLAN:  1. Coronary artery disease/anterior MI: He has had good recovery of his left ventricular function. Continue dual antiplatelet therapy for at least a year. Continue aggressive secondary prevention. LDL controlled on high-dose statin. Would continue at this time.  No CHF sx. Treated for LV mural thrombus.  2. Tobacco abuse:  Samples of Chantix given.  See if he will qualify for patient assistance. 3. Dyspnea on exertion: With normal left ventricular function and no evidence of fluid overload, I think is more likely related to a noncardiac cause. He may be somewhat deconditioned since he has not been active for  several months. I encouraged him to try to gradually increase his level of exertion to improve his stamina.   Current medicines are reviewed at length with the patient today.  The patient concerns regarding his medicines were addressed.  The following changes have been made:  No change  Labs/ tests ordered today include: none No orders of the defined types were placed in this encounter.    Recommend 150 minutes/week of aerobic exercise Low fat, low carb, high fiber diet recommended  Disposition:   FU in 6 months   Delorise Jackson., MD  07/30/2014 10:14 AM    [facility] [address] [place], [place], [place]  [address] Phone: [phone]; Fax: [phone]

## 2014-10-10 ENCOUNTER — Telehealth: Payer: Self-pay | Admitting: *Deleted

## 2014-10-10 NOTE — Telephone Encounter (Signed)
Patient came into the office for samples of brilinta. Samples given and also provided information on patient assistance.

## 2014-10-15 ENCOUNTER — Telehealth: Payer: Self-pay | Admitting: Interventional Cardiology

## 2014-10-15 NOTE — Telephone Encounter (Signed)
Spoke with pt.  He was wanting to know if we had samples of Viagra.  Told him we did his patient assistance paperwork for this earlier this year and he could call and order more through the company if he wished.

## 2014-10-15 NOTE — Telephone Encounter (Signed)
New message      Talk to Kennon Rounds about one of his prescriptions

## 2014-10-15 NOTE — Telephone Encounter (Signed)
Pt called back with his ID number for reorder.    Reorder phone number: [phone] Patient ID #- [id number]  Order placed.  #[id number].  Will be shipped within next week.

## 2014-10-20 ENCOUNTER — Encounter: Payer: Self-pay | Admitting: Internal Medicine

## 2014-10-20 ENCOUNTER — Telehealth: Payer: Self-pay

## 2014-10-20 ENCOUNTER — Ambulatory Visit: Payer: 59 | Attending: Internal Medicine | Admitting: Internal Medicine

## 2014-10-20 VITALS — BP 125/90 | HR 84 | Temp 98.2°F | Resp 18 | Ht 73.0 in | Wt 221.0 lb

## 2014-10-20 DIAGNOSIS — R918 Other nonspecific abnormal finding of lung field: Secondary | ICD-10-CM | POA: Diagnosis not present

## 2014-10-20 DIAGNOSIS — R911 Solitary pulmonary nodule: Secondary | ICD-10-CM | POA: Insufficient documentation

## 2014-10-20 DIAGNOSIS — F17213 Nicotine dependence, cigarettes, with withdrawal: Secondary | ICD-10-CM

## 2014-10-20 DIAGNOSIS — Z79899 Other long term (current) drug therapy: Secondary | ICD-10-CM | POA: Insufficient documentation

## 2014-10-20 DIAGNOSIS — Z0001 Encounter for general adult medical examination with abnormal findings: Secondary | ICD-10-CM | POA: Diagnosis not present

## 2014-10-20 DIAGNOSIS — I252 Old myocardial infarction: Secondary | ICD-10-CM | POA: Diagnosis not present

## 2014-10-20 DIAGNOSIS — E785 Hyperlipidemia, unspecified: Secondary | ICD-10-CM | POA: Insufficient documentation

## 2014-10-20 DIAGNOSIS — Z7982 Long term (current) use of aspirin: Secondary | ICD-10-CM | POA: Diagnosis not present

## 2014-10-20 DIAGNOSIS — I255 Ischemic cardiomyopathy: Secondary | ICD-10-CM | POA: Diagnosis not present

## 2014-10-20 DIAGNOSIS — I10 Essential (primary) hypertension: Secondary | ICD-10-CM | POA: Diagnosis not present

## 2014-10-20 DIAGNOSIS — F17210 Nicotine dependence, cigarettes, uncomplicated: Secondary | ICD-10-CM | POA: Diagnosis not present

## 2014-10-20 MED ORDER — ATORVASTATIN CALCIUM 80 MG PO TABS: 80.0000 mg | ORAL_TABLET | Freq: Every day | ORAL | Status: DC

## 2014-10-20 MED ORDER — LISINOPRIL 5 MG PO TABS: 5.0000 mg | ORAL_TABLET | Freq: Every day | ORAL | Status: DC

## 2014-10-20 MED ORDER — CARVEDILOL 12.5 MG PO TABS: 12.5000 mg | ORAL_TABLET | Freq: Two times a day (BID) | ORAL | Status: DC

## 2014-10-20 NOTE — Progress Notes (Signed)
Patient here for HTN F/U  Patient complains of pain "all over", legs, arms. Pain has been present for about a month. HA scaled at a 6. Extremites pain   Patient has not taken BP and cholesterol medication in 2 months.   Patient complains of bruising over the past month in thigh area and forearm and calves.

## 2014-10-20 NOTE — Progress Notes (Signed)
Patient ID: Thomas Woods, male   DOB: Aug 18, 1965, 49 y.o.   MRN: [id number]   Thomas Woods, is a 49 y.o. male  WUJ:[id number]  [id number]  DOB - 07-06-1965  No chief complaint on file.       Subjective:   Thomas Woods is a 49 y.o. male with history of hypertension, recent NSTEMI and ischemic cardiomyopathy s/p PCI and pulmonary nodule here today for a follow up visit. Patient complains of pain "all over", both legs and arms. Pain has been present for about a month. He also has headache. He agrees that he has been under tremendous amount of stress since his "heart attack". At that time, chest CT showed several intermediate right lung nodules, with recommendation to follow up CT in 6 - 12 months. Patient has extensive smoking history and worried he may have lung cancer. He is here for the repeat CT scan. He also has financial problem, has not taken his antihypertensive and statin for over 2 months because he could not afford them. He gets his Brilinta from the cardiologist as samples. Patient complains of bruising over the past month in thigh area and forearm and calves.  Patient has no chest pain, No abdominal pain, No Nausea, No new weakness tingling or numbness, No Cough - SOB. No weight loss.  Problem  Lung Nodule  Cardiomyopathy, Ischemic    ALLERGIES: No Known Allergies  PAST MEDICAL HISTORY: Past Medical History  Diagnosis Date  . Hypertension   . NSTEMI (non-ST elevated myocardial infarction) (HCC) 03/26/2014    Patient pain increased and he developed ECG changes, taken to the cath lab as a STEMI, PCI to the LAD  . Ischemic cardiomyopathy 03/27/2014    EF 30% at cath, confirmed by echo  . 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 Multiple lung nodules on CT 03/28/2014    Right lung nodules up to 6 mm, repeat CT in 6-12 months  . Hyperlipidemia LDL goal <70 03/27/2014    MEDICATIONS AT HOME: Prior to Admission medications   Medication Sig  Start Date End Date Taking? Authorizing Provider  aspirin 81 MG tablet Take 1 tablet (81 mg total) by mouth daily. 04/02/14  Yes Rhonda G Barrett, PA-C  Multiple Vitamin (MULTIVITAMIN WITH MINERALS) TABS tablet Take 1 tablet by mouth daily.   Yes Historical Provider, MD  ticagrelor (BRILINTA) 90 MG TABS tablet Take 1 tablet (90 mg total) by mouth 2 (two) times daily. 05/13/14  Yes Quentin Angst, MD  atorvastatin (LIPITOR) 80 MG tablet Take 1 tablet (80 mg total) by mouth daily. 10/20/14   Quentin Angst, MD  carvedilol (COREG) 12.5 MG tablet Take 1 tablet (12.5 mg total) by mouth 2 (two) times daily with a meal. 10/20/14   Quentin Angst, MD  lisinopril (PRINIVIL,ZESTRIL) 5 MG tablet Take 1 tablet (5 mg total) by mouth daily. 10/20/14   Quentin Angst, MD  nicotine (NICODERM CQ - DOSED IN MG/24 HOURS) 14 mg/24hr patch Place 1 patch (14 mg total) onto the skin daily. Patient not taking: Reported on 10/20/2014 04/02/14   Joline Salt Barrett, PA-C  nitroGLYCERIN (NITROSTAT) 0.4 MG SL tablet Place 0.4 mg under the tongue every 5 (five) minutes as needed for chest pain.    Historical Provider, MD  sildenafil (VIAGRA) 50 MG tablet Take 1 tablet (50 mg total) by mouth daily as needed for erectile dysfunction. Patient not taking: Reported on 10/20/2014 05/29/14   Renelda Loma  Hoyle Barr, MD  varenicline (CHANTIX STARTING MONTH PAK) 0.5 MG X 11 & 1 MG X 42 tablet Take one 0.5 mg tablet by mouth once daily for 3 days, then increase to one 0.5 mg tablet twice daily for 4 days, then increase to one 1 mg tablet twice daily. Patient not taking: Reported on 10/20/2014 05/13/14   Quentin Angst, MD     Objective:   Filed Vitals:   10/20/14 1124  BP: 125/90  Pulse: 84  Temp: 98.2 F (36.8 C)  TempSrc: Oral  Resp: 18  Height:  (1.854 m)  Weight: 221 lb (100.245 kg)    Exam General appearance : Awake, alert, not in any distress. Speech Clear. Not toxic looking HEENT: Atraumatic and  Normocephalic, pupils equally reactive to light and accomodation Neck: supple, no JVD. No cervical lymphadenopathy.  Chest:Good air entry bilaterally, no added sounds  CVS: S1 S2 regular, no murmurs.  Abdomen: Bowel sounds present, Non tender and not distended with no gaurding, rigidity or rebound. Extremities: B/L Lower Ext shows no edema, both legs are warm to touch Neurology: Awake alert, and oriented X 3, CN II-XII intact, Non focal Skin: No Rash  Data Review No results found for: HGBA1C   Assessment & Plan   1. Lung nodule  - CT CHEST NODULE FOLLOW UP LOW DOSE W/O; Future  2. Cigarette nicotine dependence with withdrawal  Dustin was counseled on the dangers of tobacco use, and was advised to quit. Reviewed strategies to maximize success, including removing cigarettes and smoking materials from environment, stress management and support of family/friends.   3. Cardiomyopathy, ischemic  - atorvastatin (LIPITOR) 80 MG tablet; Take 1 tablet (80 mg total) by mouth daily.  Dispense: 30 tablet; Refill: 11 - carvedilol (COREG) 12.5 MG tablet; Take 1 tablet (12.5 mg total) by mouth 2 (two) times daily with a meal.  Dispense: 60 tablet; Refill: 11 - lisinopril (PRINIVIL,ZESTRIL) 5 MG tablet; Take 1 tablet (5 mg total) by mouth daily.  Dispense: 30 tablet; Refill: 11   Patient have been counseled extensively about nutrition and exercise  Return in about 3 months (around 01/20/2015) for Follow up HTN, Follow up Pain and comorbidities.  The patient was given clear instructions to go to ER or return to medical center if symptoms don't improve, worsen or new problems develop. The patient verbalized understanding. The patient was told to call to get lab results if they haven't heard anything in the next week.   This note has been created with Education officer, environmental. Any transcriptional errors are unintentional.    Jeanann Lewandowsky, MD, MHA, FACP,  FAAP, CPE [facility] [place], [place] [phone]   10/20/2014, 12:27 PM

## 2014-10-20 NOTE — Telephone Encounter (Signed)
Pt called for samples of  Brilinta 90 for refill. Left at front counter for him

## 2014-10-20 NOTE — Patient Instructions (Signed)
Hypertension Hypertension, commonly called high blood pressure, is when the force of blood pumping through your arteries is too strong. Your arteries are the blood vessels that carry blood from your heart throughout your body. A blood pressure reading consists of a higher number over a lower number, such as 110/72. The higher number (systolic) is the pressure inside your arteries when your heart pumps. The lower number (diastolic) is the pressure inside your arteries when your heart relaxes. Ideally you want your blood pressure below 120/80. Hypertension forces your heart to work harder to pump blood. Your arteries may become narrow or stiff. Having untreated or uncontrolled hypertension can cause heart attack, stroke, kidney disease, and other problems. RISK FACTORS Some risk factors for high blood pressure are controllable. Others are not.  Risk factors you cannot control include:   Race. You may be at higher risk if you are African American.  Age. Risk increases with age.  Gender. Men are at higher risk than women before age 45 years. After age 65, women are at higher risk than men. Risk factors you can control include:  Not getting enough exercise or physical activity.  Being overweight.  Getting too much fat, sugar, calories, or salt in your diet.  Drinking too much alcohol. SIGNS AND SYMPTOMS Hypertension does not usually cause signs or symptoms. Extremely high blood pressure (hypertensive crisis) may cause headache, anxiety, shortness of breath, and nosebleed. DIAGNOSIS To check if you have hypertension, your health care provider will measure your blood pressure while you are seated, with your arm held at the level of your heart. It should be measured at least twice using the same arm. Certain conditions can cause a difference in blood pressure between your right and left arms. A blood pressure reading that is higher than normal on one occasion does not mean that you need treatment. If  it is not clear whether you have high blood pressure, you may be asked to return on a different day to have your blood pressure checked again. Or, you may be asked to monitor your blood pressure at home for 1 or more weeks. TREATMENT Treating high blood pressure includes making lifestyle changes and possibly taking medicine. Living a healthy lifestyle can help lower high blood pressure. You may need to change some of your habits. Lifestyle changes may include:  Following the DASH diet. This diet is high in fruits, vegetables, and whole grains. It is low in salt, red meat, and added sugars.  Keep your sodium intake below 2,300 mg per day.  Getting at least 30-45 minutes of aerobic exercise at least 4 times per week.  Losing weight if necessary.  Not smoking.  Limiting alcoholic beverages.  Learning ways to reduce stress. Your health care provider may prescribe medicine if lifestyle changes are not enough to get your blood pressure under control, and if one of the following is true:  You are 18-59 years of age and your systolic blood pressure is above 140.  You are 60 years of age or older, and your systolic blood pressure is above 150.  Your diastolic blood pressure is above 90.  You have diabetes, and your systolic blood pressure is over 140 or your diastolic blood pressure is over 90.  You have kidney disease and your blood pressure is above 140/90.  You have heart disease and your blood pressure is above 140/90. Your personal target blood pressure may vary depending on your medical conditions, your age, and other factors. HOME CARE INSTRUCTIONS    Have your blood pressure rechecked as directed by your health care provider.   Take medicines only as directed by your health care provider. Follow the directions carefully. Blood pressure medicines must be taken as prescribed. The medicine does not work as well when you skip doses. Skipping doses also puts you at risk for  problems.  Do not smoke.   Monitor your blood pressure at home as directed by your health care provider. SEEK MEDICAL CARE IF:   You think you are having a reaction to medicines taken.  You have recurrent headaches or feel dizzy.  You have swelling in your ankles.  You have trouble with your vision. SEEK IMMEDIATE MEDICAL CARE IF:  You develop a severe headache or confusion.  You have unusual weakness, numbness, or feel faint.  You have severe chest or abdominal pain.  You vomit repeatedly.  You have trouble breathing. MAKE SURE YOU:   Understand these instructions.  Will watch your condition.  Will get help right away if you are not doing well or get worse.   This information is not intended to replace advice given to you by your health care provider. Make sure you discuss any questions you have with your health care provider.   Document Released: 12/27/2004 Document Revised: 05/13/2014 Document Reviewed: 10/19/2012 Elsevier Interactive Patient Education 2016 Elsevier Inc.  

## 2014-10-22 ENCOUNTER — Telehealth: Payer: Self-pay

## 2014-10-22 NOTE — Telephone Encounter (Signed)
LMOM for patient.  Order was placed last week on 10/5.  I am not sure if it has been delivered yet as [facility].  I assured him we would contact him as soon as we received it.  I also left him the reorder number if he wanted to call and see the estimated shipment date from [employer].

## 2014-10-22 NOTE — Telephone Encounter (Signed)
This pt is asking if his pills have been shipped yet?

## 2014-10-27 NOTE — Telephone Encounter (Signed)
Called pt and left message informing pt that his medication Viagra 50 mg was ready to be picked up at the front desk and if he has any other problems, questions or concerns to call the office.

## 2014-10-30 ENCOUNTER — Telehealth: Payer: Self-pay | Admitting: Internal Medicine

## 2014-10-30 ENCOUNTER — Ambulatory Visit (HOSPITAL_COMMUNITY): Payer: 59

## 2014-10-30 NOTE — Telephone Encounter (Signed)
[facility]. Patient found out that appointment was cancelled and does not know why or who cancelled it. He mentions that he took today off of work for the appointment tonight at 6:30pm. Please f/u with pt ASAP to possibly send another referral or reschedule.

## 2014-11-06 NOTE — Telephone Encounter (Signed)
Patient verified DOB. Person took message for patient to [facility] [place] [facility] at [phone]

## 2014-11-07 NOTE — Telephone Encounter (Signed)
Patient called back please f/u at [phone]. Please f/u

## 2014-11-10 NOTE — Telephone Encounter (Signed)
Patient is aware of CT being cancelled due to insurance.

## 2014-11-18 NOTE — Telephone Encounter (Signed)
Saw note in chart and advised patient to go to ED to get his CT scan done without pre cert.

## 2014-11-18 NOTE — Telephone Encounter (Signed)
Patient is calling because he needs clarification on his next steps to getting his CT scan as referred by his pcp dr Hyman HopesJegede. His insurance [facility]([employer] Obamacare would not cover it and he was told there was a way to get it done [facility]. Please follow up with pt.

## 2014-12-09 ENCOUNTER — Telehealth: Payer: Self-pay | Admitting: Interventional Cardiology

## 2014-12-09 NOTE — Telephone Encounter (Addendum)
Called and spoke with pt's wife.  I have not been in the clinic today and did not call patient.  I cannot tell who may have called.

## 2014-12-09 NOTE — Telephone Encounter (Signed)
New Message    Pt calling stating that he is returning a phone call from our office. States that someone called his house while his wife was on the phone around 9:30 this morning. I notified the pt that I didn;t see a note in his chart that anyone called him and that he may have been called by accident since there is no note and he didn't have a vm. Pt is convinced that the person that called him is Audrie LiaSally Earl. Please call back and advise.

## 2014-12-10 ENCOUNTER — Telehealth: Payer: Self-pay | Admitting: Interventional Cardiology

## 2014-12-10 NOTE — Telephone Encounter (Signed)
Left message for the pt to call back in regards to refill request.

## 2014-12-10 NOTE — Telephone Encounter (Signed)
New Message  Pt requested to have RX for Viagra filled- pt wanted to speak w/ RN to ensure the refill was sent. Please call back and discuss.

## 2014-12-10 NOTE — Telephone Encounter (Signed)
Informed the pt that I spoke with Audrie LiaSally Earl in regards to his pt assistance program for Viagra through [employer].  Informed the pt that per Audrie LiaSally Earl, his script for this med was filled in Oct 2016, which means its probably too early to fill.  Informed the pt that he can call [employer] himself and ask if its ok to fill this med now or wait.  Informed the pt that once the company gives the ok to refill his Viagra, then he should call our office back so we  can call the company back to provide them Dr Hoyle BarrVaranasi's ordering information and provide his pt ID number, as indicated by [facility]Sally Earl [facility], from last time he refilled this med.  Also informed the pt that once the ok is given, then the reorder can be placed and shipped to our office, to have the pt pick up.  Reorder phone number: [phone] Patient ID #- [id number] Order placed. #[id number].  Informed the pt that I will route this information to Dr Hoyle BarrVaranasi's nurse for further review and follow-up, once he calls back with the ok from the pt assistance program to refill this med.   Pt verbalized understanding and agrees with this plan.  Pt verbalized understanding and agrees with this plan.

## 2014-12-31 ENCOUNTER — Emergency Department (HOSPITAL_COMMUNITY)
Admission: EM | Admit: 2014-12-31 | Discharge: 2014-12-31 | Disposition: A | Discharge status: Home / Self Care | Payer: 59 | Attending: Emergency Medicine | Admitting: Emergency Medicine

## 2014-12-31 ENCOUNTER — Encounter (HOSPITAL_COMMUNITY): Payer: Self-pay | Admitting: Emergency Medicine

## 2014-12-31 ENCOUNTER — Emergency Department (HOSPITAL_COMMUNITY): Payer: 59

## 2014-12-31 DIAGNOSIS — Z09 Encounter for follow-up examination after completed treatment for conditions other than malignant neoplasm: Secondary | ICD-10-CM

## 2014-12-31 DIAGNOSIS — R0602 Shortness of breath: Secondary | ICD-10-CM | POA: Insufficient documentation

## 2014-12-31 DIAGNOSIS — Z7982 Long term (current) use of aspirin: Secondary | ICD-10-CM | POA: Insufficient documentation

## 2014-12-31 DIAGNOSIS — I10 Essential (primary) hypertension: Secondary | ICD-10-CM | POA: Insufficient documentation

## 2014-12-31 DIAGNOSIS — Z79899 Other long term (current) drug therapy: Secondary | ICD-10-CM | POA: Insufficient documentation

## 2014-12-31 DIAGNOSIS — I252 Old myocardial infarction: Secondary | ICD-10-CM | POA: Insufficient documentation

## 2014-12-31 DIAGNOSIS — Z87891 Personal history of nicotine dependence: Secondary | ICD-10-CM | POA: Insufficient documentation

## 2014-12-31 DIAGNOSIS — E785 Hyperlipidemia, unspecified: Secondary | ICD-10-CM | POA: Insufficient documentation

## 2014-12-31 DIAGNOSIS — Z7902 Long term (current) use of antithrombotics/antiplatelets: Secondary | ICD-10-CM | POA: Insufficient documentation

## 2014-12-31 NOTE — ED Notes (Signed)
Pt here for 6 month re eval for "spot on lung"; pt sts sent by PCP

## 2014-12-31 NOTE — Discharge Instructions (Signed)
Please read and follow all provided instructions.  Your diagnoses today include:  1. Follow up    Tests performed today include:  Vital signs. See below for your results today.   Medications prescribed:   None   Home care instructions:  Follow any educational materials contained in this packet.  Follow-up instructions: Please follow-up with your primary care provider in the next 48 hours for further evaluation of symptoms and treatment   Return instructions:   Please return to the Emergency Department if you do not get better, if you get worse, or new symptoms OR  - Fever (temperature greater than 101.21F)  - Bleeding that does not stop with holding pressure to the area    -Severe pain (please note that you may be more sore the day after your accident)  - Chest Pain  - Difficulty breathing  - Severe nausea or vomiting  - Inability to tolerate food and liquids  - Passing out  - Skin becoming red around your wounds  - Change in mental status (confusion or lethargy)  - New numbness or weakness     Please return if you have any other emergent concerns.  Additional Information:  Your vital signs today were: BP 137/107 mmHg   Pulse 81   Temp(Src) 98.1 F (36.7 C) (Oral)   Resp 20   SpO2 98% If your blood pressure (BP) was elevated above 135/85 this visit, please have this repeated by your doctor within one month. ---------------  EXAM: CT CHEST WITHOUT CONTRAST  TECHNIQUE: Multidetector CT imaging of the chest was performed following the standard protocol without IV contrast.  COMPARISON: 03/28/2014  FINDINGS: Previously seen right lung nodules have resolved. No pulmonary nodules currently. No pleural effusions or confluent airspace opacities.  Heart is normal size. Aorta is normal caliber. Stent noted within the left anterior descending coronary artery. Coronary artery calcifications noted. No mediastinal, hilar, or axillary adenopathy. Chest wall soft  tissues are unremarkable. Imaging into the upper abdomen shows no acute findings.  No acute bony abnormality or focal bone lesion.  IMPRESSION: Resolution of the previously seen right lung nodules, likely inflammatory.

## 2014-12-31 NOTE — ED Provider Notes (Signed)
CSN: [id number]     Arrival date & time 12/31/14  1124 History   First MD Initiated Contact with Patient 12/31/14 1132     Chief Complaint  Patient presents with  . needs CT scan    (Consider location/radiation/quality/duration/timing/severity/associated sxs/prior Treatment) HPI 49 y.o. male with a hx of MI on 03/16/14 with 2 stents placed, presents to the Emergency Department today due to a follow up on a repeat CT scan for pulmonary nodules. He states that his original appointment was in October, but his insurance company stated that it was an Architect procedure" and declined the scan. The patient followed up with his PCP- Dr. Hyman Hopes who told him to come to the ER for a repeat scan of the nodules. Scan is below. Hx of smoking x20+ years. Pt has no acute symptoms presently.   Date: 03/28/14 IMPRESSION: Several indeterminate right lung nodules measuring up to 6 mm. If the patient is at high risk for bronchogenic carcinoma, follow-up chest CT at 6-12 months is recommended. If the patient is at low risk for bronchogenic carcinoma, follow-up chest CT at 12 months is recommended. This recommendation follows the consensus statement: Guidelines for Management of Small Pulmonary Nodules Detected on CT Scans: A Statement from the Fleischner Society as published in Radiology 2005;237:395-400.     Past Medical History  Diagnosis Date  . Hypertension   . NSTEMI (non-ST elevated myocardial infarction) (HCC) 03/26/2014    Patient pain increased and he developed ECG changes, taken to the cath lab as a STEMI, PCI to the LAD  . Ischemic cardiomyopathy 03/27/2014    EF 30% at cath, confirmed by echo  . 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 Multiple lung nodules on CT 03/28/2014    Right lung nodules up to 6 mm, repeat CT in 6-12 months  . Hyperlipidemia LDL goal <70 03/27/2014   Past Surgical History  Procedure Laterality Date  . Hernia repair    .  Left heart catheterization with coronary angiogram N/A 03/27/2014    Procedure: LEFT HEART CATHETERIZATION WITH CORONARY ANGIOGRAM;  Surgeon: Corky Crafts, MD; L main okay, LAD 100%, CFX okay, RI moderate disease, OM1 mild disease, RCA moderate disease, EF 30%  . Percutaneous coronary stent intervention (pci-s)  03/27/2014    3.0 x 32 Synergy overlapped with 2.5 x 38 Synergy to the mLAD   Family History  Problem Relation Age of Onset  . Healthy Mother   . Healthy Sister   . Healthy Brother    Social History  Substance Use Topics  . Smoking status: Former Smoker -- 0.50 packs/day    Types: Cigarettes  . Smokeless tobacco: None  . Alcohol Use: 0.0 oz/week    0 Standard drinks or equivalent per week    Review of Systems  Constitutional: Negative for fever, chills, diaphoresis and fatigue.  HENT: Negative for congestion, sinus pressure, sore throat and tinnitus.   Eyes: Negative for visual disturbance.  Respiratory: Positive for shortness of breath. Negative for cough.   Cardiovascular: Negative for chest pain.  Gastrointestinal: Negative for nausea, vomiting, abdominal pain, diarrhea and constipation.  Endocrine: Negative for cold intolerance and heat intolerance.  Musculoskeletal: Negative for back pain.  Skin: Negative for color change.  Neurological: Negative for dizziness, syncope, weakness, numbness and headaches.   Allergies  Review of patient's allergies indicates no known allergies.  Home Medications   Prior to Admission medications   Medication Sig Start Date  End Date Taking? Authorizing Provider  aspirin 81 MG tablet Take 1 tablet (81 mg total) by mouth daily. 04/02/14   Rhonda G Barrett, PA-C  atorvastatin (LIPITOR) 80 MG tablet Take 1 tablet (80 mg total) by mouth daily. 10/20/14   Quentin Angstlugbemiga E Jegede, MD  carvedilol (COREG) 12.5 MG tablet Take 1 tablet (12.5 mg total) by mouth 2 (two) times daily with a meal. 10/20/14   Quentin Angstlugbemiga E Jegede, MD  lisinopril  (PRINIVIL,ZESTRIL) 5 MG tablet Take 1 tablet (5 mg total) by mouth daily. 10/20/14   Quentin Angstlugbemiga E Jegede, MD  Multiple Vitamin (MULTIVITAMIN WITH MINERALS) TABS tablet Take 1 tablet by mouth daily.    Historical Provider, MD  nicotine (NICODERM CQ - DOSED IN MG/24 HOURS) 14 mg/24hr patch Place 1 patch (14 mg total) onto the skin daily. Patient not taking: Reported on 10/20/2014 04/02/14   Joline Salthonda G Barrett, PA-C  nitroGLYCERIN (NITROSTAT) 0.4 MG SL tablet Place 0.4 mg under the tongue every 5 (five) minutes as needed for chest pain.    Historical Provider, MD  sildenafil (VIAGRA) 50 MG tablet Take 1 tablet (50 mg total) by mouth daily as needed for erectile dysfunction. Patient not taking: Reported on 10/20/2014 05/29/14   Corky CraftsJayadeep S Varanasi, MD  ticagrelor (BRILINTA) 90 MG TABS tablet Take 1 tablet (90 mg total) by mouth 2 (two) times daily. 05/13/14   Quentin Angstlugbemiga E Jegede, MD  varenicline (CHANTIX STARTING MONTH PAK) 0.5 MG X 11 & 1 MG X 42 tablet Take one 0.5 mg tablet by mouth once daily for 3 days, then increase to one 0.5 mg tablet twice daily for 4 days, then increase to one 1 mg tablet twice daily. Patient not taking: Reported on 10/20/2014 05/13/14   Quentin Angstlugbemiga E Jegede, MD   BP 137/107 mmHg  Pulse 81  Temp(Src) 98.1 F (36.7 C) (Oral)  Resp 20  SpO2 98% Physical Exam  Constitutional: He is oriented to person, place, and time. He appears well-developed.  Neck: Neck supple.  Cardiovascular: Normal rate and regular rhythm.   Pulmonary/Chest: Effort normal and breath sounds normal.  Abdominal: Soft.  Neurological: He is alert and oriented to person, place, and time.  Skin: Skin is warm and dry.  Psychiatric: He has a normal mood and affect. His behavior is normal. Thought content normal.   ED Course  Procedures (including critical care time) Labs Review Labs Reviewed - No data to display  Imaging Review Ct Chest Wo Contrast  12/31/2014  CLINICAL DATA:  Followup lung nodule. EXAM: CT  CHEST WITHOUT CONTRAST TECHNIQUE: Multidetector CT imaging of the chest was performed following the standard protocol without IV contrast. COMPARISON:  03/28/2014 FINDINGS: Previously seen right lung nodules have resolved. No pulmonary nodules currently. No pleural effusions or confluent airspace opacities. Heart is normal size. Aorta is normal caliber. Stent noted within the left anterior descending coronary artery. Coronary artery calcifications noted. No mediastinal, hilar, or axillary adenopathy. Chest wall soft tissues are unremarkable. Imaging into the upper abdomen shows no acute findings. No acute bony abnormality or focal bone lesion. IMPRESSION: Resolution of the previously seen right lung nodules, likely inflammatory. Coronary artery disease.  Prior LAD stenting. Electronically Signed   By: Charlett NoseKevin  Dover M.D.   On: 12/31/2014 12:53   I have personally reviewed and evaluated these images and lab results as part of my medical decision-making.   EKG Interpretation None     MDM  I have reviewed relevant imaging studies. I have reviewed the relevant previous  healthcare records.I obtained HPI from historian. Cases discussed with Attending Physician  ED Course:  Assessment: Pt is a 49y M in NAD. Told to come to ED by PCP for f/u CT on pulmonary nodule.  CT scan resulted and showed no pulmonary nodules. Likely inflammation.   Disposition/Plan:  CT scan and d/c with f/u to PCP Counseled patient on use of Emergency Department  Additional Verbal discharge instructions given and discussed with patient.    Patient was discussed with Melene Plan, DO   Final diagnoses:  Follow up       Audry Pili, PA-C 12/31/14 2214  Melene Plan, DO 01/01/15 3086

## 2015-01-02 ENCOUNTER — Telehealth: Payer: Self-pay | Admitting: Interventional Cardiology

## 2015-01-02 NOTE — Telephone Encounter (Signed)
New message      Patient calling the office for samples of medication:   1.  What medication and dosage are you requesting samples for? brilinta 90mg  2.  Are you currently out of this medication? yes

## 2015-01-02 NOTE — Telephone Encounter (Signed)
error    *STAT* If patient is at the pharmacy, call can be transferred to refill team.   1. Which medications need to be refilled? (please list name of each medication and dose if known)  2. Which pharmacy/location (including street and city if local pharmacy) is medication to be sent to? 3. Do they need a 30 day or 90 day supply?

## 2015-01-06 ENCOUNTER — Telehealth: Payer: Self-pay | Admitting: *Deleted

## 2015-01-06 NOTE — Telephone Encounter (Signed)
Called patient to let him know that we have one bottle of brilinta available for him to pick up. 

## 2015-01-07 ENCOUNTER — Telehealth: Payer: Self-pay | Admitting: Interventional Cardiology

## 2015-01-07 ENCOUNTER — Telehealth: Payer: Self-pay | Admitting: *Deleted

## 2015-01-07 NOTE — Telephone Encounter (Signed)
sildenafil (VIAGRA) 50 MG tablet 50 mg, Daily PRN 3 ordered Reorder  Patient not taking. Reason: Patient Preference, Informant: Self, Reported on 12/31/2014    LVM for pt to call back, pt is reporting that he is not taking Viagra, yet he ask for a refill, need to verify with pt.

## 2015-01-07 NOTE — Telephone Encounter (Signed)
New Message  Pt called med refill.   *STAT* If patient is at the pharmacy, call can be transferred to refill team.   1. Which medications need to be refilled? (please list name of each medication and dose if known) Viagra   2. Which pharmacy/location (including street and city if local pharmacy) is medication to be sent to? [facility] [phone]  3. Do they need a 30 day or 90 day supply? 30 day  Customer id [phone]  Will need the nurse to call in and give the customer ID#/ Doctor's DA#. This is the reorder line. Please call back to discuss.

## 2015-01-07 NOTE — Telephone Encounter (Signed)
wanted viagra filled, has been reported several times that he isnt taking it, he wants us to send in a refill and that he is using it, it was a cost issue. pt is in a asst program.    Awilda BillBrandy J Stefanny Pieri, CMA at 01/07/2015 2:14 PM     Status: Signed       Expand All Collapse All   sildenafil (VIAGRA) 50 MG tablet 50 mg, Daily PRN 3 ordered Reorder  Patient not taking. Reason: Patient Preference, Informant: Self, Reported on 12/31/2014   LVM for pt to call back, pt is reporting that he is not taking Viagra, yet he ask for a refill, need to verify with pt.                Amanda CockayneShanterra M Robinson at 01/07/2015 2:01 PM     Status: Signed       Expand All Collapse All   New Message  Pt called med refill.   *STAT* If patient is at the pharmacy, call can be transferred to refill team.   1. Which medications need to be refilled? (please list name of each medication and dose if known) Viagra   2. Which pharmacy/location (including street and city if local pharmacy) is medication to be sent to? [facility] [phone]  3. Do they need a 30 day or 90 day supply? 30 day  Customer id [id number]  Will need the nurse to call in and give the customer ID#/ Doctor's DA#. This is the reorder line. Please call back to discuss.         Reorder phone number: [phone] Patient ID #- [id number]  See 12/10/14 phone note

## 2015-01-08 NOTE — Telephone Encounter (Signed)
Re-order of Viagra through [facility] done. Confirmation order # [id number].

## 2015-01-14 ENCOUNTER — Telehealth: Payer: Self-pay | Admitting: Interventional Cardiology

## 2015-01-14 NOTE — Telephone Encounter (Signed)
Patient notified that his Viagra 50mg  has been shipped and is here to pick up. He states he will pick it up with his Brilinta. Expressed gratitude.

## 2015-01-14 NOTE — Telephone Encounter (Signed)
New message    Patient calling the office for samples of medication:   1.  What medication and dosage are you requesting samples for? brilinita 90 days   2.  Are you currently out of this medication? yes

## 2015-01-14 NOTE — Telephone Encounter (Signed)
left brilinta samples up front, pt aware.

## 2015-01-30 ENCOUNTER — Telehealth: Payer: Self-pay | Admitting: Interventional Cardiology

## 2015-01-30 NOTE — Telephone Encounter (Signed)
New message     Patient calling the office for samples of medication:   1.  What medication and dosage are you requesting samples for? brilinta   2.  Are you currently out of this medication? No Want to pick up samples

## 2015-01-30 NOTE — Telephone Encounter (Signed)
Samples placed at the front desk for patient. 

## 2015-02-05 ENCOUNTER — Ambulatory Visit: Payer: Self-pay | Admitting: Internal Medicine

## 2015-02-23 ENCOUNTER — Ambulatory Visit (INDEPENDENT_AMBULATORY_CARE_PROVIDER_SITE_OTHER): Payer: Self-pay | Admitting: Interventional Cardiology

## 2015-02-23 ENCOUNTER — Encounter: Payer: Self-pay | Admitting: Interventional Cardiology

## 2015-02-23 VITALS — BP 130/85 | HR 80 | Ht 73.0 in | Wt 218.0 lb

## 2015-02-23 DIAGNOSIS — I2510 Atherosclerotic heart disease of native coronary artery without angina pectoris: Secondary | ICD-10-CM | POA: Insufficient documentation

## 2015-02-23 DIAGNOSIS — Z720 Tobacco use: Secondary | ICD-10-CM

## 2015-02-23 DIAGNOSIS — E785 Hyperlipidemia, unspecified: Secondary | ICD-10-CM

## 2015-02-23 NOTE — Patient Instructions (Signed)
**Note De-identified Daley Gosse Obfuscation** Medication Instructions:  Same-no changes  Labwork: None  Testing/Procedures: None  Follow-Up: Your physician wants you to follow-up in: 6 months. You will receive a reminder letter in the mail two months in advance. If you don't receive a letter, please call our office to schedule the follow-up appointment.      If you need a refill on your cardiac medications before your next appointment, please call your pharmacy.   

## 2015-02-23 NOTE — Progress Notes (Signed)
Patient ID: Thomas Woods, male   DOB: February 04, 1965, 50 y.o.   MRN: [id number]     Cardiology Office Note   Date:  02/23/2015   ID:  Thomas Woods, DOB 08-09-1965, MRN [id number]  PCP:  Jeanann Lewandowsky, MD    No chief complaint on file.  F/u CAD, anterior MI  Wt Readings from Last 3 Encounters:  02/23/15 218 lb (98.884 kg)  10/20/14 221 lb (100.245 kg)  07/30/14 220 lb (99.791 kg)       History of Present Illness: Thomas Woods is a 50 y.o. male  Who had an anterior MI.He had 2 long overlapping drug-eluting stents to the LAD. His EF was low at 30%. He had an apical thrombus, and was treated with warfarin. He has done well. He denies any chest pain. His most recent EF is now low normal.  He has smoked cigarettes recently, but less than he was doing before.  He wants to try the patch.  He is under a lot of stress.  He states he is "homeless" and cannot afford the patches.  He is staying with a family member in [place].  Repeat CT showed that nodule resolved per his report.     Past Medical History  Diagnosis Date  . Hypertension   . NSTEMI (non-ST elevated myocardial infarction) (HCC) 03/26/2014    Patient pain increased and he developed ECG changes, taken to the cath lab as a STEMI, PCI to the LAD  . Ischemic cardiomyopathy 03/27/2014    EF 30% at cath, confirmed by echo  . 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 Multiple lung nodules on CT 03/28/2014    Right lung nodules up to 6 mm, repeat CT in 6-12 months  . Hyperlipidemia LDL goal <70 03/27/2014    Past Surgical History  Procedure Laterality Date  . Hernia repair    . Left heart catheterization with coronary angiogram N/A 03/27/2014    Procedure: LEFT HEART CATHETERIZATION WITH CORONARY ANGIOGRAM;  Surgeon: Corky Crafts, MD; L main okay, LAD 100%, CFX okay, RI moderate disease, OM1 mild disease, RCA moderate disease, EF 30%  . Percutaneous coronary stent intervention  (pci-s)  03/27/2014    3.0 x 32 Synergy overlapped with 2.5 x 38 Synergy to the mLAD     Current Outpatient Prescriptions  Medication Sig Dispense Refill  . aspirin 81 MG tablet Take 1 tablet (81 mg total) by mouth daily. 30 tablet 0  . atorvastatin (LIPITOR) 80 MG tablet Take 1 tablet (80 mg total) by mouth daily. 30 tablet 11  . carvedilol (COREG) 12.5 MG tablet Take 1 tablet (12.5 mg total) by mouth 2 (two) times daily with a meal. 60 tablet 11  . lisinopril (PRINIVIL,ZESTRIL) 5 MG tablet Take 1 tablet (5 mg total) by mouth daily. 30 tablet 11  . nicotine (NICODERM CQ - DOSED IN MG/24 HOURS) 14 mg/24hr patch Place 1 patch (14 mg total) onto the skin daily. 14 patch 0  . nitroGLYCERIN (NITROSTAT) 0.4 MG SL tablet Place 0.4 mg under the tongue every 5 (five) minutes as needed for chest pain (not taking due to cost.). Reported on 12/31/2014    . sildenafil (VIAGRA) 50 MG tablet Take 1 tablet (50 mg total) by mouth daily as needed for erectile dysfunction. 10 tablet 3  . ticagrelor (BRILINTA) 90 MG TABS tablet Take 90 mg by mouth 2 (two) times daily.     No current facility-administered medications  for this visit.    Allergies:   Review of patient's allergies indicates no known allergies.    Social History:  The patient  reports that he has quit smoking. His smoking use included Cigarettes. He smoked 0.50 packs per day. He does not have any smokeless tobacco history on file. He reports that he drinks alcohol. He reports that he does not use illicit drugs.   Family History:  The patient's family history includes Healthy in his brother, mother, and sister. There is no history of Heart attack, Hypertension, or Stroke.    ROS:  Please see the history of present illness.   Otherwise, review of systems are positive for occasional SHOB, nothing like his prior MI.   All other systems are reviewed and negative.    PHYSICAL EXAM: VS:  BP 130/85 mmHg  Pulse 80  Ht  (1.854 m)  Wt 218 lb  (98.884 kg)  BMI 28.77 kg/m2 , BMI Body mass index is 28.77 kg/(m^2). GEN: Well nourished, well developed, in no acute distress HEENT: normal Neck: no JVD, carotid bruits, or masses Cardiac: RRR; no murmurs, rubs, or gallops,no edema  Respiratory:  clear to auscultation bilaterally, normal work of breathing GI: soft, nontender, nondistended, + BS MS: no deformity or atrophy Skin: warm and dry, no rash Neuro:  Strength and sensation are intact Psych: euthymic mood, full affect   EKG:   The ekg ordered today demonstrates NSR, ant Q waves, no ST segment changes   Recent Labs: 03/26/2014: B Natriuretic Peptide 48.4 03/27/2014: TSH 0.769 03/28/2014: BUN 7; Creatinine, Ser 1.07; Potassium 4.3; Sodium 137 04/02/2014: Hemoglobin 14.6; Platelets 201 06/23/2014: ALT 21 07/07/2014: Pro B Natriuretic peptide (BNP) 31.0   Lipid Panel    Component Value Date/Time   CHOL 140 06/23/2014 1140   TRIG 64.0 06/23/2014 1140   HDL 58.80 06/23/2014 1140   CHOLHDL 2 06/23/2014 1140   VLDL 12.8 06/23/2014 1140   LDLCALC 68 06/23/2014 1140     Other studies Reviewed: Additional studies/ records that were reviewed today with results demonstrating: cath results reviewed.   ASSESSMENT AND PLAN:  1. CAD/MI: no anginal like his prior MI. Continue dual antiplatelet therapy. He is getting his Brilinta through the company. That is most important medicine. One of his medications is costing $30 a month. This is too much for him to afford. I've asked him to let us know which medication it is and will try to change it to something more affordable. 2. Hyperlipidemia: for now, continue atorvastatin 3. HTN: continue carvedilol. 4. He needs to stop smoking.  Patches have worked for him in the past.    Current medicines are reviewed at length with the patient today.  The patient concerns regarding his medicines were addressed.  The following changes have been made:  No change  Labs/ tests ordered today include:    No orders of the defined types were placed in this encounter.    Recommend 150 minutes/week of aerobic exercise Low fat, low carb, high fiber diet recommended  Disposition:   FU in 6 months   Delorise Jackson., MD  02/23/2015 3:09 PM    [facility] [address] [place], [place], [place]  [address] Phone: [phone]; Fax: [phone]

## 2015-03-05 ENCOUNTER — Encounter: Payer: Self-pay | Admitting: Internal Medicine

## 2015-03-05 ENCOUNTER — Ambulatory Visit: Payer: Self-pay | Attending: Internal Medicine | Admitting: Internal Medicine

## 2015-03-05 ENCOUNTER — Ambulatory Visit (HOSPITAL_BASED_OUTPATIENT_CLINIC_OR_DEPARTMENT_OTHER): Payer: Self-pay | Admitting: Clinical

## 2015-03-05 VITALS — BP 111/75 | HR 84 | Temp 98.1°F | Resp 18 | Ht 73.0 in | Wt 218.0 lb

## 2015-03-05 DIAGNOSIS — I10 Essential (primary) hypertension: Secondary | ICD-10-CM | POA: Insufficient documentation

## 2015-03-05 DIAGNOSIS — F4323 Adjustment disorder with mixed anxiety and depressed mood: Secondary | ICD-10-CM

## 2015-03-05 DIAGNOSIS — Z590 Homelessness: Secondary | ICD-10-CM | POA: Insufficient documentation

## 2015-03-05 DIAGNOSIS — Z79899 Other long term (current) drug therapy: Secondary | ICD-10-CM | POA: Insufficient documentation

## 2015-03-05 DIAGNOSIS — I255 Ischemic cardiomyopathy: Secondary | ICD-10-CM

## 2015-03-05 DIAGNOSIS — M545 Low back pain: Secondary | ICD-10-CM | POA: Insufficient documentation

## 2015-03-05 DIAGNOSIS — F17213 Nicotine dependence, cigarettes, with withdrawal: Secondary | ICD-10-CM

## 2015-03-05 DIAGNOSIS — R918 Other nonspecific abnormal finding of lung field: Secondary | ICD-10-CM | POA: Insufficient documentation

## 2015-03-05 DIAGNOSIS — E785 Hyperlipidemia, unspecified: Secondary | ICD-10-CM | POA: Insufficient documentation

## 2015-03-05 DIAGNOSIS — I252 Old myocardial infarction: Secondary | ICD-10-CM | POA: Insufficient documentation

## 2015-03-05 DIAGNOSIS — Z87891 Personal history of nicotine dependence: Secondary | ICD-10-CM | POA: Insufficient documentation

## 2015-03-05 DIAGNOSIS — Z7982 Long term (current) use of aspirin: Secondary | ICD-10-CM | POA: Insufficient documentation

## 2015-03-05 MED ORDER — NICOTINE 14 MG/24HR TD PT24: 14.0000 mg | MEDICATED_PATCH | Freq: Every day | TRANSDERMAL | Status: DC

## 2015-03-05 MED ORDER — ATORVASTATIN CALCIUM 80 MG PO TABS: 80.0000 mg | ORAL_TABLET | Freq: Every day | ORAL | Status: DC

## 2015-03-05 MED ORDER — LISINOPRIL 5 MG PO TABS: 5.0000 mg | ORAL_TABLET | Freq: Every day | ORAL | Status: DC

## 2015-03-05 MED ORDER — CARVEDILOL 12.5 MG PO TABS: 12.5000 mg | ORAL_TABLET | Freq: Two times a day (BID) | ORAL | Status: DC

## 2015-03-05 MED ORDER — ASPIRIN 81 MG PO TABS: 81.0000 mg | ORAL_TABLET | Freq: Every day | ORAL | Status: DC

## 2015-03-05 MED FILL — SM NICOTINE 14 MG/24HR PATC: 14MG/24HR | 28 days supply | Qty: 28 | Fill #0

## 2015-03-05 MED FILL — LISINOPRIL 5 MG TABLET: 5 | 30 days supply | Qty: 30 | Fill #0

## 2015-03-05 MED FILL — ATORVASTATIN 80 MG TABLET: 80 | 30 days supply | Qty: 30 | Fill #0

## 2015-03-05 MED FILL — CARVEDILOL 12.5 MG TABLET: 12.5 | 30 days supply | Qty: 60 | Fill #0

## 2015-03-05 NOTE — Progress Notes (Signed)
Thomas Woods, is a 50 y.o. male  WNU:[id number]  IHK:[id number]  DOB - Jun 27, 1965  CC:  Chief Complaint  Patient presents with  . Follow-up    HTN       HPI: Thomas Woods is a 50 y.o. male here today  for follow up visit. Patient has history of NSTEMI>>STEMI w/ DES x 2 to the LAD 03/27/2014, med rx for mod RCA dz, ICM w/ EF 25-30% w/ LifeVest, apical thrombus on coumadin. Coumadin has now been stopped. Rx w/ ASA, Brilinta, Coreg and statin. Patient is here today to establish care with a primary care doctor. He has no new complaints. He is fairly compliant with his medications and instructions including smoking cessation. Denies any lower extremity swelling, denies paroxysmal nocturnal dyspnea, denies orthopnea. He needs refills for his medications. Patient has complaints of continued shoulder pain and low back pain and states rest relieves pain and he occasionally takes otc, NSAIDs. Patient has started smoking again .5 packs per day and would like to try the Nicotine patch. Patient reported smoking while wearing the nicotine patch last year.  Patient declines pneumonia and influenzae vaccines. Patient also has complaint of new stressors and anxiety related to his homelessness. Patient has No headache, No chest pain, No abdominal pain - No Nausea, No new weakness tingling or numbness, No Cough - SOB.  No Known Allergies Past Medical History  Diagnosis Date  . Hypertension   . NSTEMI (non-ST elevated myocardial infarction) (HCC) 03/26/2014    Patient pain increased and he developed ECG changes, taken to the cath lab as a STEMI, PCI to the LAD  . Ischemic cardiomyopathy 03/27/2014    EF 30% at cath, confirmed by echo  . 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 Multiple lung nodules on CT 03/28/2014    Right lung nodules up to 6 mm, repeat CT in 6-12 months  . Hyperlipidemia LDL goal <70 03/27/2014   Current Outpatient Prescriptions on File Prior to Visit    Medication Sig Dispense Refill  . nitroGLYCERIN (NITROSTAT) 0.4 MG SL tablet Place 0.4 mg under the tongue every 5 (five) minutes as needed for chest pain (not taking due to cost.). Reported on 03/05/2015    . sildenafil (VIAGRA) 50 MG tablet Take 1 tablet (50 mg total) by mouth daily as needed for erectile dysfunction. (Patient not taking: Reported on 03/05/2015) 10 tablet 3  . ticagrelor (BRILINTA) 90 MG TABS tablet Take 90 mg by mouth 2 (two) times daily. Reported on 03/05/2015     No current facility-administered medications on file prior to visit.   Family History  Problem Relation Age of Onset  . Healthy Mother   . Healthy Sister   . Healthy Brother   . Heart attack Neg Hx   . Hypertension Neg Hx   . Stroke Neg Hx    Social History   Social History  . Marital Status: Married    Spouse Name: N/A  . Number of Children: N/A  . Years of Education: N/A   Occupational History  . Not on file.   Social History Main Topics  . Smoking status: Former Smoker -- 0.50 packs/day    Types: Cigarettes  . Smokeless tobacco: Not on file  . Alcohol Use: 0.0 oz/week    0 Standard drinks or equivalent per week  . Drug Use: No  . Sexual Activity: Yes   Other Topics Concern  . Not on file  Social History Narrative    Review of Systems: Constitutional: Negative for fever, chills, diaphoresis, activity change, appetite change and fatigue. HENT: Negative for ear pain, nosebleeds, congestion, facial swelling, rhinorrhea, neck pain, neck stiffness and ear discharge.  Eyes: Negative for pain, discharge, redness, itching and visual disturbance. Respiratory: Negative for cough, choking, chest tightness, shortness of breath, wheezing and stridor.  Cardiovascular: Negative for chest pain, palpitations and leg swelling. Gastrointestinal: Negative for abdominal distention. Genitourinary: Negative for dysuria, urgency, frequency, hematuria, flank pain, decreased urine volume, difficulty urinating  and dyspareunia.  Musculoskeletal: Negative for back pain, joint swelling, arthralgia and gait problem. Neurological: Negative for dizziness, tremors, seizures, syncope, facial asymmetry, speech difficulty, weakness, light-headedness, numbness and headaches.  Hematological: Negative for adenopathy. Does not bruise/bleed easily. Psychiatric/Behavioral: Negative for hallucinations, behavioral problems, confusion, dysphoric mood, decreased concentration and agitation.    Objective:   Filed Vitals:   03/05/15 1005  BP: 111/75  Pulse: 84  Temp: 98.1 F (36.7 C)  Resp: 18    Physical Exam: Constitutional: Patient appears well-developed and well-nourished. No distress. Neck: Normal ROM. Neck supple. No JVD. No tracheal deviation. No thyromegaly. CVS: RRR, S1/S2 +, no murmurs, no gallops, no carotid bruit.  Pulmonary: Effort and breath sounds normal, no stridor, rhonchi, wheezes, rales.  Abdominal: Soft. BS +, no distension, tenderness, rebound or guarding.  Musculoskeletal: Normal range of motion. No edema and no tenderness.  Lymphadenopathy: No lymphadenopathy noted. Neuro: Alert & Oriented x 4 Skin: Skin is warm and dry. No rash noted. Not diaphoretic. No erythema. No pallor. Psychiatric: Normal mood and affect. Behavior, judgment, thought content normal.  Lab Results  Component Value Date   WBC 6.0 04/02/2014   HGB 14.6 04/02/2014   HCT 43.0 04/02/2014   MCV 91.9 04/02/2014   PLT 201 04/02/2014   Lab Results  Component Value Date   CREATININE 1.07 03/28/2014   BUN 7 03/28/2014   NA 137 03/28/2014   K 4.3 03/28/2014   CL 103 03/28/2014   CO2 28 03/28/2014    No results found for: HGBA1C Lipid Panel     Component Value Date/Time   CHOL 140 06/23/2014 1140   TRIG 64.0 06/23/2014 1140   HDL 58.80 06/23/2014 1140   CHOLHDL 2 06/23/2014 1140   VLDL 12.8 06/23/2014 1140   LDLCALC 68 06/23/2014 1140       Assessment and plan:   Thomas Woods was seen today for  follow-up.  Diagnoses and all orders for this visit:  Cardiomyopathy, ischemic -     aspirin 81 MG tablet; Take 1 tablet (81 mg total) by mouth daily. -     atorvastatin (LIPITOR) 80 MG tablet; Take 1 tablet (80 mg total) by mouth daily. -     carvedilol (COREG) 12.5 MG tablet; Take 1 tablet (12.5 mg total) by mouth 2 (two) times daily with a meal. -     lisinopril (PRINIVIL,ZESTRIL) 5 MG tablet; Take 1 tablet (5 mg total) by mouth daily.  Cigarette nicotine dependence with withdrawal -     nicotine (NICODERM CQ - DOSED IN MG/24 HOURS) 14 mg/24hr patch; Place 1 patch (14 mg total) onto the skin daily.  Thomas Woods was counseled on the dangers of tobacco use, and was advised to quit. Reviewed strategies to maximize success, including removing cigarettes and smoking materials from environment, stress management and support of family/friends.   Return in about 6 months (around 09/02/2015) for Heart Failure and Hypertension.  The patient was given clear instructions to go to  ER or return to medical center if symptoms don't improve, worsen or new problems develop. The patient verbalized understanding. The patient was told to call to get lab results if they haven't heard anything in the next week.     Stephanie Coup, AGNP-Student [facility] [phone] 03/05/2015, 1:09 PM   Evaluation and management procedures were performed by the Advanced Practitioner under my supervision and collaboration. I have reviewed the Advanced Practitioner's note and chart, and I agree with the management and plan.   Jeanann Lewandowsky, MD, MHA, CPE, FACP, FAAP [facility] [place], [place] [phone]   03/05/2015, 6:28 PM

## 2015-03-05 NOTE — Progress Notes (Signed)
ASSESSMENT: Pt currently experiencing Adjustment disorder with mixed anxiety and depressed mood. Pt needs to f/u with PCP and [facility]; would benefit from brief Solution-Focused therapy, along with community resources, regarding coping with symptoms of anxiety and depression. Stage of Change: contemplative  PLAN: 1. F/U with behavioral health consultant in one month 2. Psychiatric Medications: none. 3. Behavioral recommendation(s):   -Consider appointment with financial counseling, when Medicaid is denied -Consider [facility] as community resource, as needed -Consider reading educational material regarding coping with symptoms of  Anxiety and depression SUBJECTIVE: Pt. referred by Dr Hyman Hopes for psychosocial:  Pt. reports the following symptoms/concerns: Pt states that he is having difficulty adjusting to both financial and health conditions; pt working part-time, but only receives about $100/month after child support arrears taken out of paycheck, had a heart attack in March 2016, and is living with family members in less than ideal living arrangement. Pt copes by attempting to keep a positive outlook, yet is overwhelmed by life stressors; states "I don't ever want to hurt myself, nothing like that, it's just hard sometimes". Has filed for disability, one denial, working with Clinical research associate; uninsured. Duration of problem: Six months Severity: mild  OBJECTIVE: Orientation & Cognition: Oriented x3. Thought processes normal and appropriate to situation. Mood: appropriate. Affect: appropriate Appearance: appropriate Risk of harm to self or others: no known risk of harm to self or others Substance use: none Assessments administered: none (pt says he answered the questions "earlier", but left, having lost the PHQ9/GAD7)   Diagnosis: Adjustment disorder with mixed anxiety and depressed mood CPT Code: F43.23 -------------------------------------------- Other(s) present in the room: none  Time spent with patient in  exam room: 16 minutes, 11:00-11:20am   Depression screen [facility] 2/9 05/13/2014 04/03/2014  Decreased Interest 0 0  Down, Depressed, Hopeless 0 0  PHQ - 2 Score 0 0    No flowsheet data found.

## 2015-03-05 NOTE — Patient Instructions (Signed)
Smoking Cessation, Tips for Success If you are ready to quit smoking, congratulations! You have chosen to help yourself be healthier. Cigarettes bring nicotine, tar, carbon monoxide, and other irritants into your body. Your lungs, heart, and blood vessels will be able to work better without these poisons. There are many different ways to quit smoking. Nicotine gum, nicotine patches, a nicotine inhaler, or nicotine nasal spray can help with physical craving. Hypnosis, support groups, and medicines help break the habit of smoking. WHAT THINGS CAN I DO TO MAKE QUITTING EASIER?  Here are some tips to help you quit for good:  Pick a date when you will quit smoking completely. Tell all of your friends and family about your plan to quit on that date.  Do not try to slowly cut down on the number of cigarettes you are smoking. Pick a quit date and quit smoking completely starting on that day.  Throw away all cigarettes.   Clean and remove all ashtrays from your home, work, and car.  On a card, write down your reasons for quitting. Carry the card with you and read it when you get the urge to smoke.  Cleanse your body of nicotine. Drink enough water and fluids to keep your urine clear or pale yellow. Do this after quitting to flush the nicotine from your body.  Learn to predict your moods. Do not let a bad situation be your excuse to have a cigarette. Some situations in your life might tempt you into wanting a cigarette.  Never have "just one" cigarette. It leads to wanting another and another. Remind yourself of your decision to quit.  Change habits associated with smoking. If you smoked while driving or when feeling stressed, try other activities to replace smoking. Stand up when drinking your coffee. Brush your teeth after eating. Sit in a different chair when you read the paper. Avoid alcohol while trying to quit, and try to drink fewer caffeinated beverages. Alcohol and caffeine may urge you to  smoke.  Avoid foods and drinks that can trigger a desire to smoke, such as sugary or spicy foods and alcohol.  Ask people who smoke not to smoke around you.  Have something planned to do right after eating or having a cup of coffee. For example, plan to take a walk or exercise.  Try a relaxation exercise to calm you down and decrease your stress. Remember, you may be tense and nervous for the first 2 weeks after you quit, but this will pass.  Find new activities to keep your hands busy. Play with a pen, coin, or rubber band. Doodle or draw things on paper.  Brush your teeth right after eating. This will help cut down on the craving for the taste of tobacco after meals. You can also try mouthwash.   Use oral substitutes in place of cigarettes. Try using lemon drops, carrots, cinnamon sticks, or chewing gum. Keep them handy so they are available when you have the urge to smoke.  When you have the urge to smoke, try deep breathing.  Designate your home as a nonsmoking area.  If you are a heavy smoker, ask your health care provider about a prescription for nicotine chewing gum. It can ease your withdrawal from nicotine.  Reward yourself. Set aside the cigarette money you save and buy yourself something nice.  Look for support from others. Join a support group or smoking cessation program. Ask someone at home or at work to help you with your plan   to quit smoking.  Always ask yourself, "Do I need this cigarette or is this just a reflex?" Tell yourself, "Today, I choose not to smoke," or "I do not want to smoke." You are reminding yourself of your decision to quit.  Do not replace cigarette smoking with electronic cigarettes (commonly called e-cigarettes). The safety of e-cigarettes is unknown, and some may contain harmful chemicals.  If you relapse, do not give up! Plan ahead and think about what you will do the next time you get the urge to smoke. HOW WILL I FEEL WHEN I QUIT SMOKING? You  may have symptoms of withdrawal because your body is used to nicotine (the addictive substance in cigarettes). You may crave cigarettes, be irritable, feel very hungry, cough often, get headaches, or have difficulty concentrating. The withdrawal symptoms are only temporary. They are strongest when you first quit but will go away within 10-14 days. When withdrawal symptoms occur, stay in control. Think about your reasons for quitting. Remind yourself that these are signs that your body is healing and getting used to being without cigarettes. Remember that withdrawal symptoms are easier to treat than the major diseases that smoking can cause.  Even after the withdrawal is over, expect periodic urges to smoke. However, these cravings are generally short lived and will go away whether you smoke or not. Do not smoke! WHAT RESOURCES ARE AVAILABLE TO HELP ME QUIT SMOKING? Your health care provider can direct you to community resources or hospitals for support, which may include:  Group support.  Education.  Hypnosis.  Therapy.   This information is not intended to replace advice given to you by your health care provider. Make sure you discuss any questions you have with your health care provider.   Document Released: 09/25/2003 Document Revised: 01/17/2014 Document Reviewed: 06/14/2012 Elsevier Interactive Patient Education 2016 Elsevier Inc.  

## 2015-03-05 NOTE — Progress Notes (Signed)
Patient is here for FU HTN  Patient declined the flu shot today.  Patient complains of bilateral shoulder pain and lower bak pain currently scaled at a 6.  Patient is recently homeless and would like to speak with SW.

## 2015-03-12 ENCOUNTER — Telehealth: Payer: Self-pay

## 2015-03-12 NOTE — Telephone Encounter (Signed)
Samples of Brilinta  3 bottles given to patient. He states he no longer has health insurance. I gave him Patient Assistance forms for [employer].

## 2015-03-24 ENCOUNTER — Telehealth: Payer: Self-pay

## 2015-03-24 NOTE — Telephone Encounter (Signed)
Pt brought back Patient Assistance forms for [employer] along with a copy of his W-2. He also requested samples of Brilinta. Jeani HawkingGave Linda Reiland, LPN his documents and gave pt 4 bottles of Brilinta.

## 2015-04-13 ENCOUNTER — Telehealth: Payer: Self-pay | Admitting: Interventional Cardiology

## 2015-04-13 MED ORDER — SILDENAFIL CITRATE 50 MG PO TABS: 50.0000 mg | ORAL_TABLET | Freq: Every day | ORAL | Status: DC | PRN

## 2015-04-13 MED FILL — LISINOPRIL 5 MG TABLET: 5 | 30 days supply | Qty: 30 | Fill #1

## 2015-04-13 MED FILL — ?CARVEDILOL 12.5 MG TABLET: 12.5 | 30 days supply | Qty: 60 | Fill #1

## 2015-04-13 MED FILL — ATORVASTATIN 80 MG TABLET: 80 | 30 days supply | Qty: 30 | Fill #1

## 2015-04-13 NOTE — Telephone Encounter (Signed)
**Note De-Identified Thomas Woods Obfuscation** The pt states that he is due for a refill on his Viagra. He is advised that we will refill his Viagra and that in the future he doesn't have to speak to a nurse for refill he can just call and request his refill and his message will be sent to our refill pool. He verbalized understanding.

## 2015-04-13 NOTE — Telephone Encounter (Signed)
New message   pt is calling for rn to order a prescriptions for Viagra 50mg 

## 2015-04-20 NOTE — Telephone Encounter (Signed)
Patient is calling to speak with Larita FifeLynn about a rx he gets through [place]. H e would like a call back from KramerLynn.

## 2015-04-21 NOTE — Telephone Encounter (Signed)
The pt is inquiring about his pt assistance on Viagra. Will forward message to Odette HornsLinda Reiland, LPN ([facility]).

## 2015-04-23 ENCOUNTER — Telehealth: Payer: Self-pay | Admitting: Clinical

## 2015-04-23 NOTE — Telephone Encounter (Signed)
Attempt to f/u with pt, no voicemail, no message left 

## 2015-04-29 ENCOUNTER — Other Ambulatory Visit: Payer: Self-pay

## 2015-04-29 NOTE — Telephone Encounter (Signed)
Follow up      Calling to get update on prior authorization for viagra.  He is also confused on why he has to [facility] it [facility] at [facility] instead of here.  He want to pick it up at our office if possible.  Please call when you come back to work

## 2015-05-04 ENCOUNTER — Telehealth: Payer: Self-pay | Admitting: Internal Medicine

## 2015-05-04 NOTE — Telephone Encounter (Signed)
Explained to patient that [facility] is requiring that he gets the Rx there. Also, the PA needs to be done through them. He just needs to call and schedule an appt for himself.

## 2015-05-04 NOTE — Telephone Encounter (Signed)
Pt. Called requesting to to his PCP regarding his Viagra. Pt. Has some questions to ask his PCP. Please f/u with pt.

## 2015-05-06 NOTE — Telephone Encounter (Signed)
Message routed to the PCP for advice

## 2015-05-15 NOTE — Telephone Encounter (Signed)
Pt. Called stating that the nurse has not called him back. Pt. Stated is really important for him to speak with his nurse or  PCP. He has some questions about his medication. Please f/u

## 2015-05-18 ENCOUNTER — Telehealth: Payer: Self-pay

## 2015-05-18 ENCOUNTER — Telehealth: Payer: Self-pay | Admitting: Interventional Cardiology

## 2015-05-18 NOTE — Telephone Encounter (Signed)
Samples of Brilinta 90 mg provided to patient. He needs to apply for Medicaid (and be denied) before approval from [employer]. i have also given him an application for Viagra Patient Asistance. [facility]

## 2015-05-18 NOTE — Telephone Encounter (Signed)
Patient to call the nurse at his cardiology clinic to figure out how he will continue to receive free viagra from their pharmacy through the Cardiologist's medication assistance program. If needed we will assist in coordinating the prescription

## 2015-05-18 NOTE — Telephone Encounter (Signed)
Pt calling c/o prescription for Viagra-needs to see if he can continue to get this med and needs to talk to nurse- [phone]

## 2015-05-19 NOTE — Telephone Encounter (Signed)
Spoke with patient. App form for Viagra provided to him.

## 2015-05-22 ENCOUNTER — Telehealth: Payer: Self-pay

## 2015-05-22 NOTE — Telephone Encounter (Signed)
Samples of Aspirin 81 MG given to patient  Thomas CraftsJayadeep S Varanasi, MD at 02/23/2015 2:37 PM aspirin 81 MG tabletTake 1 tablet (81 mg total) by mouth daily Current medicines are reviewed at length with the patient today. The patient concerns regarding his medicines were addressed.  The following changes have been made: No change

## 2015-05-26 NOTE — Telephone Encounter (Signed)
Medical Assistant left message on patient's home and cell voicemail. Voicemail states to give a call back to Anyelin Mogle with [facility] at [phone].  

## 2015-05-28 MED FILL — CARVEDILOL 12.5 MG TABLET: 12.5 | 30 days supply | Qty: 60 | Fill #2

## 2015-05-28 MED FILL — LISINOPRIL 5 MG TABLET: 5 | 30 days supply | Qty: 30 | Fill #2

## 2015-05-28 MED FILL — ATORVASTATIN 80 MG TABLET: 80 | 30 days supply | Qty: 30 | Fill #2

## 2015-06-16 ENCOUNTER — Telehealth: Payer: Self-pay | Admitting: Interventional Cardiology

## 2015-06-16 NOTE — Telephone Encounter (Signed)
New Message    Pt calling to speak with RN about lab results. Please call back to discuss

## 2015-06-16 NOTE — Telephone Encounter (Signed)
Pt was calling for samples of Brilinta 90 mg.  We did have samples available and 6 bottles Lot # [id number] exp 10/19 were placed at front desk.  Pt is aware.

## 2015-06-19 ENCOUNTER — Telehealth: Payer: Self-pay | Admitting: Internal Medicine

## 2015-06-19 NOTE — Telephone Encounter (Signed)
Patient dropped off paperwork to be filled by doctor.

## 2015-06-22 MED FILL — ATORVASTATIN 80 MG TABLET: 80 | 30 days supply | Qty: 30 | Fill #3

## 2015-06-22 MED FILL — ?CARVEDILOL 12.5 MG TABLET: 12.5 | 30 days supply | Qty: 60 | Fill #3

## 2015-06-22 MED FILL — LISINOPRIL 5 MG TABLET: 5 | 30 days supply | Qty: 30 | Fill #3

## 2015-07-01 NOTE — Telephone Encounter (Signed)
Patient verified DOB Patient is aware of paperwork being completed and awaiting his signature. Patient stated he would return to the office on tomorrow or Friday to complete the form Patient states he is having trouble coping with his depression and is wanting to reach out to PCP for possible medication or referral. MA informed patient of message being routed to PCP for advice. Patient denies any suicidal ideations at this time. Patient states he does not want to get to the point of "not wanting to be here". MA informed patient of speaking with him tomorrow and informing him of providers advice. Patient expressed his gratitude and had no further questions at this time.

## 2015-07-15 ENCOUNTER — Telehealth: Payer: Self-pay | Admitting: Interventional Cardiology

## 2015-07-15 NOTE — Telephone Encounter (Signed)
Pt calling for samples of Brilanta

## 2015-07-15 NOTE — Telephone Encounter (Signed)
Spoke with patient to let him know that I will place samples at the front desk. He also requested asa 81 mg samples, which I will also include in the bag.

## 2015-07-30 MED FILL — ATORVASTATIN 80 MG TABLET: 80 | 30 days supply | Qty: 30 | Fill #4

## 2015-07-30 MED FILL — LISINOPRIL 5 MG TABLET: 5 | 30 days supply | Qty: 30 | Fill #4

## 2015-07-30 MED FILL — CARVEDILOL 12.5 MG TABLET: 12.5 | 30 days supply | Qty: 60 | Fill #4

## 2015-08-04 ENCOUNTER — Telehealth: Payer: Self-pay | Admitting: Internal Medicine

## 2015-08-04 NOTE — Telephone Encounter (Signed)
Pt called requesting status of paperwork dropped off last month, please f/up

## 2015-08-13 ENCOUNTER — Telehealth: Payer: Self-pay | Admitting: *Deleted

## 2015-08-13 ENCOUNTER — Telehealth: Payer: Self-pay | Admitting: Interventional Cardiology

## 2015-08-13 NOTE — Telephone Encounter (Signed)
Patient calling the office for samples of medication:   1.  What medication and dosage are you requesting samples for? Brilinta 90mg   and Low dose Aspirin   2.  Are you currently out of this medication? yes

## 2015-08-13 NOTE — Telephone Encounter (Signed)
called to let pt know that samples were placed up front, pt had called back from prior message left on machine regarding this. left VM reiterating medication samples left up.

## 2015-08-13 NOTE — Telephone Encounter (Signed)
F/u Message ° °Pt returning RN call. Please call back to discuss  °

## 2015-08-13 NOTE — Telephone Encounter (Signed)
Called pt and left message informing pt that I was leaving samples of Brilinta 90 mg tablets and Aspirin 81 mg tablet. I'm sending a 1 month supply of Brilinta 90 mg taken twice daily. Lot: [facility] [id number]  Exp: 01-2018 and a box of Aspirin 81 mg tablet taken once daily. I advised the pt that if he has any other problems, questions or concerns to call the office.

## 2015-08-18 NOTE — Telephone Encounter (Signed)
Pt. Called requesting to know the status of his paperwork. Please f/u

## 2015-08-24 ENCOUNTER — Encounter: Payer: Self-pay | Admitting: Interventional Cardiology

## 2015-08-24 ENCOUNTER — Ambulatory Visit (INDEPENDENT_AMBULATORY_CARE_PROVIDER_SITE_OTHER): Payer: Self-pay | Admitting: Interventional Cardiology

## 2015-08-24 VITALS — BP 115/80 | HR 76 | Ht 73.0 in | Wt 208.0 lb

## 2015-08-24 DIAGNOSIS — I2510 Atherosclerotic heart disease of native coronary artery without angina pectoris: Secondary | ICD-10-CM

## 2015-08-24 DIAGNOSIS — I252 Old myocardial infarction: Secondary | ICD-10-CM

## 2015-08-24 DIAGNOSIS — Z720 Tobacco use: Secondary | ICD-10-CM

## 2015-08-24 DIAGNOSIS — N529 Male erectile dysfunction, unspecified: Secondary | ICD-10-CM

## 2015-08-24 DIAGNOSIS — I10 Essential (primary) hypertension: Secondary | ICD-10-CM

## 2015-08-24 DIAGNOSIS — E785 Hyperlipidemia, unspecified: Secondary | ICD-10-CM

## 2015-08-24 MED FILL — $Viagra 50mg tablet: 50 | 15 days supply | Qty: 5 | Fill #0

## 2015-08-24 NOTE — Progress Notes (Signed)
Cardiology Office Note   Date:  08/24/2015   ID:  Thomas Woods, DOB 08-09-1965, MRN [id number]  PCP:  Jeanann LewandowskyJEGEDE, OLUGBEMIGA, MD    No chief complaint on file. f/u CAD   Wt Readings from Last 3 Encounters:  08/24/15 208 lb (94.3 kg)  03/05/15 218 lb (98.9 kg)  02/23/15 218 lb (98.9 kg)       History of Present Illness: Thomas Woods is a 50 y.o. male   Who had an anterior MI in 3/16.He had 2 long overlapping drug-eluting stents to the LAD. His EF was low at 30%. He had an apical thrombus, and was treated with warfarin for a few months. He has done well. He denies any chest pain. His most recent EF is now low normal.  He has smoked cigarettes recently, but less than he was doing before.  He wears the patch sometimes.  He still smokes 3-4 cigarettes a day, but takes the patch off to do this.  He had a difficult living situation but this is improving.  He stays with a cousin.  He has child support payments t omake and is living on $8/hr.    Repeat CT showed that lung nodule resolved per his report.   Has some left shoulder pain and back pain with movement.  He walks a lot at work.    He has not gotten his Viagra from the pharmacy due to some processing issue.      Past Medical History:  Diagnosis Date  . Hyperlipidemia LDL goal <70 03/27/2014  . Hypertension   . Ischemic cardiomyopathy 03/27/2014   EF 30% at cath, confirmed by echo  . Multiple lung nodules on CT 03/28/2014   Right lung nodules up to 6 mm, repeat CT in 6-12 months  . 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 NSTEMI (non-ST elevated myocardial infarction) (HCC) 03/26/2014   Patient pain increased and he developed ECG changes, taken to the cath lab as a STEMI, PCI to the LAD    Past Surgical History:  Procedure Laterality Date  . HERNIA REPAIR    . LEFT HEART CATHETERIZATION WITH CORONARY ANGIOGRAM N/A 03/27/2014   Procedure: LEFT HEART CATHETERIZATION WITH CORONARY  ANGIOGRAM;  Surgeon: Corky CraftsJayadeep S Akyia Borelli, MD; L main okay, LAD 100%, CFX okay, RI moderate disease, OM1 mild disease, RCA moderate disease, EF 30%  . PERCUTANEOUS CORONARY STENT INTERVENTION (PCI-S)  03/27/2014   3.0 x 32 Synergy overlapped with 2.5 x 38 Synergy to the mLAD     Current Outpatient Prescriptions  Medication Sig Dispense Refill  . aspirin 81 MG tablet Take 1 tablet (81 mg total) by mouth daily. 30 tablet 3  . atorvastatin (LIPITOR) 80 MG tablet Take 1 tablet (80 mg total) by mouth daily. 90 tablet 3  . carvedilol (COREG) 12.5 MG tablet Take 1 tablet (12.5 mg total) by mouth 2 (two) times daily with a meal. 180 tablet 3  . lisinopril (PRINIVIL,ZESTRIL) 5 MG tablet Take 1 tablet (5 mg total) by mouth daily. 90 tablet 3  . nicotine (NICODERM CQ - DOSED IN MG/24 HOURS) 14 mg/24hr patch Place 1 patch (14 mg total) onto the skin daily. 21 patch 3  . sildenafil (VIAGRA) 50 MG tablet Take 1 tablet (50 mg total) by mouth daily as needed for erectile dysfunction. 10 tablet 4  . ticagrelor (BRILINTA) 90 MG TABS tablet Take 90 mg by mouth 2 (two) times daily. Reported on 03/05/2015  No current facility-administered medications for this visit.     Allergies:   Review of patient's allergies indicates no known allergies.    Social History:  The patient  reports that he has quit smoking. His smoking use included Cigarettes. He smoked 0.50 packs per day. He does not have any smokeless tobacco history on file. He reports that he drinks alcohol. He reports that he does not use drugs.   Family History:  The patient's family history includes Healthy in his brother, mother, and sister.    ROS:  Please see the history of present illness.   Otherwise, review of systems are positive for back pain.   All other systems are reviewed and negative.    PHYSICAL EXAM: VS:  BP 115/80   Pulse 76   Ht 6\' 1"  (1.854 m)   Wt 208 lb (94.3 kg)   BMI 27.44 kg/m  , BMI Body mass index is 27.44 kg/m. GEN:  Well nourished, well developed, in no acute distress  HEENT: normal  Neck: no JVD, carotid bruits, or masses Cardiac: RRR; no murmurs, rubs, or gallops,no edema  Respiratory:  clear to auscultation bilaterally, normal work of breathing GI: soft, nontender, nondistended, + BS MS: no deformity or atrophy  Skin: warm and dry, no rash Neuro:  Strength and sensation are intact Psych: euthymic mood, full affect   Recent Labs: No results found for requested labs within last 8760 hours.   Lipid Panel    Component Value Date/Time   CHOL 140 06/23/2014 1140   TRIG 64.0 06/23/2014 1140   HDL 58.80 06/23/2014 1140   CHOLHDL 2 06/23/2014 1140   VLDL 12.8 06/23/2014 1140   LDLCALC 68 06/23/2014 1140     Other studies Reviewed: Additional studies/ records that were reviewed today with results demonstrating: Cath and lipid results reviewed.   ASSESSMENT AND PLAN:  1. CAD/MI: No angina.  Continue aggressive secondary prevention.  Ideally, would like to switch him to clopidogrel from Brilinta. He has to check on the cost of clopidogrel. Currently, he is getting free Brilinta. Given the length of stented segment, I would like him to stay on clopidogrel long term.  Aspirin could be stopped at that time. 2. Hyperlipidemia: Recheck lipids fasting tomorrow. Continue high-dose atorvastatin. 3. HTN: BP controlled.  Continue current medicines. 4. Tobacco abuse: Continue patches to try to quit smoking.  5. Erectile dysfunction: We called in Viagra for him before. He stated there is a problem getting it at the pharmacy. Will check with his pharmacy.   Current medicines are reviewed at length with the patient today.  The patient concerns regarding his medicines were addressed.  The following changes have been made:  No change  Labs/ tests ordered today include:  No orders of the defined types were placed in this encounter.   Recommend 150 minutes/week of aerobic exercise Low fat, low carb, high  fiber diet recommended  Disposition:   FU in 1 year   one yearSigned, Lance MussJayadeep Royden Bulman, MD  08/24/2015 2:13 PM    [facility] [address], [place], [place]  [address] Phone: [phone]; Fax: [phone]

## 2015-08-24 NOTE — Patient Instructions (Signed)
**Note De-Identified Thomas Woods Obfuscation** Medication Instructions:  Same-no changes  Labwork: Tomorrow anytime between 7:30 and 5:00.  Testing/Procedures: None  Follow-Up: Your physician wants you to follow-up in: 1 year. You will receive a reminder letter in the mail two months in advance. If you don't receive a letter, please call our office to schedule the follow-up appointment.   Any Other Special Instructions Will Be Listed Below (If Applicable). Check on Clopidogrel with your pharmacy     If you need a refill on your cardiac medications before your next appointment, please call your pharmacy.

## 2015-08-25 ENCOUNTER — Other Ambulatory Visit: Payer: Self-pay

## 2015-08-27 NOTE — Telephone Encounter (Signed)
Pt. Called requesting to know the status of the paperwork that he dropped off in June. Pt. States is regarding medication that he is unable to pay. Please f/u

## 2015-09-01 NOTE — Telephone Encounter (Signed)
Medical Assistant left message on patient's home and cell voicemail. Voicemail states to give a call back to [place] [facility] [phone].   !!!MA is unaware of the paperwork patient is speaking of. Patient picked up paperwork two months ago, that is the only paperwork I am aware of!!!

## 2015-09-01 NOTE — Telephone Encounter (Signed)
Pt. Called requesting to speak with the nurse regarding his paperwork. Please f/u

## 2015-09-09 ENCOUNTER — Telehealth: Payer: Self-pay | Admitting: Internal Medicine

## 2015-09-09 MED FILL — ATORVASTATIN 80 MG TABLET: 80 | 30 days supply | Qty: 30 | Fill #5

## 2015-09-09 MED FILL — ?CARVEDILOL 12.5 MG TABLET: 12.5 | 30 days supply | Qty: 60 | Fill #5

## 2015-09-09 MED FILL — LISINOPRIL 5 MG TABLET: 5 | 30 days supply | Qty: 30 | Fill #5

## 2015-09-09 NOTE — Telephone Encounter (Signed)
Patient called requesting to speak to nurse regarding paperwork for medication assistance, pt would like a call back, pt states he was called to come in and sign paperwork and came in to sign and was given back to provider. Please f/up

## 2015-10-19 MED FILL — CARVEDILOL 12.5 MG TABLET: 12.5 | 30 days supply | Qty: 60 | Fill #6

## 2015-10-19 MED FILL — LISINOPRIL 5 MG TABLET: 5 | 30 days supply | Qty: 30 | Fill #6

## 2015-10-19 MED FILL — ATORVASTATIN 80 MG TABLET: 80 | 30 days supply | Qty: 30 | Fill #6

## 2015-10-20 ENCOUNTER — Telehealth: Payer: Self-pay | Admitting: Interventional Cardiology

## 2015-10-20 ENCOUNTER — Telehealth: Payer: Self-pay | Admitting: Internal Medicine

## 2015-10-20 MED ORDER — CLOPIDOGREL BISULFATE 75 MG PO TABS: 75.0000 mg | ORAL_TABLET | Freq: Every day | ORAL | 3 refills | Status: DC

## 2015-10-20 NOTE — Telephone Encounter (Signed)
The Plavix rx would not print, I gave pt hand written rx.

## 2015-10-20 NOTE — Addendum Note (Signed)
Addended by: Lear NgMARTIN, MISTY L on: 10/20/2015 12:57 PM   Modules accepted: Orders

## 2015-10-20 NOTE — Telephone Encounter (Signed)
New Message  Patient calling the office for samples of medication:   1.  What medication and dosage are you requesting samples for? Brilinta  2.  Are you currently out of this medication? yes

## 2015-10-20 NOTE — Telephone Encounter (Signed)
I spoke to patient. He states he has not taken Brilinta since the first week in September. He is stented. He states he needs Brilinta samples to take until he can get Plavix filled.  He is requesting hard copy of Plavix rx.  I advised him I would put Brilinta samples up front with hard copy of Plavix rx for pick up today. I advised him not to take Plavix and Brilinta together and (according to 08/17 note) to stop ASA when starts Plavix. He voiced understanding and agreed with plan.

## 2015-10-20 NOTE — Telephone Encounter (Signed)
LMTCB

## 2015-10-20 NOTE — Telephone Encounter (Signed)
New Message   Pt call requesting to speak with RN. Pt states he needs a hard copy of his prescription to give to the pharmacy. Please call back to discuss

## 2015-10-22 ENCOUNTER — Other Ambulatory Visit: Payer: Self-pay | Admitting: *Deleted

## 2015-10-22 MED ORDER — SILDENAFIL CITRATE 50 MG PO TABS: 50.0000 mg | ORAL_TABLET | Freq: Every day | ORAL | 3 refills | Status: DC | PRN

## 2015-10-22 NOTE — Telephone Encounter (Signed)
PRINTED FOR PASS PROGRAM 

## 2015-11-18 ENCOUNTER — Telehealth: Payer: Self-pay | Admitting: Interventional Cardiology

## 2015-11-18 NOTE — Telephone Encounter (Signed)
Pt's wife is calling  Wants to know what pt can take for pain/headache etc. Due to meds currently on  Please return call

## 2015-11-18 NOTE — Telephone Encounter (Signed)
I called pt's mobile # and LMTCB. I called pt's home # and his wife, Serina Cowperlisa answered.  She states she was calling to fine out what is safe for pt to take when he has pain, like headaches, muscle aches. I advised her she is not listed on DPR so I can not discuss anything with her.  She states she pays the bill and has been married to him for 15 years. I advised her that she is not listed on the form, therefore, I can not speak with her in regards to pt's healthcare.  I advised her at next office visit, have patient to add her to [facility] if he wishes.  She states she will just call the pharmacy and ask them. I advised her they may be able to help her. She voiced understanding and thanks.

## 2015-11-24 MED FILL — ATORVASTATIN 80 MG TABLET: 80 | 30 days supply | Qty: 30 | Fill #7

## 2015-11-24 MED FILL — LISINOPRIL 5 MG TABLET: 5 | 30 days supply | Qty: 30 | Fill #7

## 2015-11-24 MED FILL — ?CARVEDILOL 12.5 MG TABLET: 12.5 | 30 days supply | Qty: 60 | Fill #7

## 2015-11-25 MED FILL — CLOPIDOGREL 75 MG TABLET: 75 | 30 days supply | Qty: 30 | Fill #0

## 2015-12-07 ENCOUNTER — Other Ambulatory Visit: Payer: Self-pay | Admitting: *Deleted

## 2015-12-07 DIAGNOSIS — E785 Hyperlipidemia, unspecified: Secondary | ICD-10-CM

## 2015-12-07 LAB — COMPREHENSIVE METABOLIC PANEL
ALBUMIN: 4.4 g/dL (ref 3.6–5.1)
ALT: 22 U/L (ref 9–46)
AST: 24 U/L (ref 10–40)
Alkaline Phosphatase: 45 U/L (ref 40–115)
BILIRUBIN TOTAL: 0.8 mg/dL (ref 0.2–1.2)
BUN: 16 mg/dL (ref 7–25)
CHLORIDE: 103 mmol/L (ref 98–110)
CO2: 28 mmol/L (ref 20–31)
CREATININE: 0.91 mg/dL (ref 0.60–1.35)
Calcium: 9.3 mg/dL (ref 8.6–10.3)
Glucose, Bld: 101 mg/dL — ABNORMAL HIGH (ref 65–99)
Potassium: 4.2 mmol/L (ref 3.5–5.3)
SODIUM: 138 mmol/L (ref 135–146)
Total Protein: 7.3 g/dL (ref 6.1–8.1)

## 2015-12-07 LAB — LIPID PANEL
CHOL/HDL RATIO: 2.1 ratio (ref <5.0)
CHOLESTEROL: 167 mg/dL (ref <200)
HDL: 79 mg/dL (ref >40)
LDL Cholesterol: 80 mg/dL (ref <100)
Triglycerides: 39 mg/dL (ref <150)
VLDL: 8 mg/dL (ref <30)

## 2015-12-07 MED FILL — $Viagra 50mg tablet: 50 | 30 days supply | Qty: 10 | Fill #1

## 2015-12-23 MED FILL — ATORVASTATIN 80 MG TABLET: 80 | 30 days supply | Qty: 30 | Fill #8

## 2015-12-23 MED FILL — LISINOPRIL 5 MG TABLET: 5 | 30 days supply | Qty: 30 | Fill #8

## 2015-12-24 ENCOUNTER — Ambulatory Visit: Payer: Self-pay | Attending: Internal Medicine | Admitting: Internal Medicine

## 2015-12-24 ENCOUNTER — Encounter: Payer: Self-pay | Admitting: Internal Medicine

## 2015-12-24 VITALS — BP 117/76 | HR 79 | Temp 98.0°F | Resp 18 | Ht 73.0 in | Wt 211.2 lb

## 2015-12-24 DIAGNOSIS — I255 Ischemic cardiomyopathy: Secondary | ICD-10-CM | POA: Insufficient documentation

## 2015-12-24 DIAGNOSIS — F17213 Nicotine dependence, cigarettes, with withdrawal: Secondary | ICD-10-CM | POA: Insufficient documentation

## 2015-12-24 DIAGNOSIS — Z7902 Long term (current) use of antithrombotics/antiplatelets: Secondary | ICD-10-CM | POA: Insufficient documentation

## 2015-12-24 DIAGNOSIS — E785 Hyperlipidemia, unspecified: Secondary | ICD-10-CM | POA: Insufficient documentation

## 2015-12-24 DIAGNOSIS — Z79899 Other long term (current) drug therapy: Secondary | ICD-10-CM | POA: Insufficient documentation

## 2015-12-24 DIAGNOSIS — Z7982 Long term (current) use of aspirin: Secondary | ICD-10-CM | POA: Insufficient documentation

## 2015-12-24 DIAGNOSIS — I10 Essential (primary) hypertension: Secondary | ICD-10-CM | POA: Insufficient documentation

## 2015-12-24 DIAGNOSIS — I252 Old myocardial infarction: Secondary | ICD-10-CM | POA: Insufficient documentation

## 2015-12-24 DIAGNOSIS — F4323 Adjustment disorder with mixed anxiety and depressed mood: Secondary | ICD-10-CM | POA: Insufficient documentation

## 2015-12-24 MED ORDER — LISINOPRIL 5 MG PO TABS: 5.0000 mg | ORAL_TABLET | Freq: Every day | ORAL | 3 refills | Status: DC

## 2015-12-24 MED ORDER — TRAZODONE HCL 50 MG PO TABS
25.0000 mg | ORAL_TABLET | Freq: Every evening | ORAL | 3 refills | Status: DC | PRN
Start: 2015-12-24 — End: 2016-08-03

## 2015-12-24 MED ORDER — CARVEDILOL 12.5 MG PO TABS: 12.5000 mg | ORAL_TABLET | Freq: Two times a day (BID) | ORAL | 3 refills | Status: DC

## 2015-12-24 MED ORDER — CLOPIDOGREL BISULFATE 75 MG PO TABS: 75.0000 mg | ORAL_TABLET | Freq: Every day | ORAL | 3 refills | Status: DC

## 2015-12-24 MED ORDER — ATORVASTATIN CALCIUM 80 MG PO TABS: 80.0000 mg | ORAL_TABLET | Freq: Every day | ORAL | 3 refills | Status: DC

## 2015-12-24 MED ORDER — BUSPIRONE HCL 30 MG PO TABS: 30.0000 mg | ORAL_TABLET | Freq: Two times a day (BID) | ORAL | 3 refills | Status: DC

## 2015-12-24 NOTE — Progress Notes (Signed)
Thomas SolGuy Bethel, is a 50 y.o. male  NWG:[id number]:[id number]  VHQ:[id number]  DOB - February 09, 1965  No chief complaint on file.     Subjective:   Thomas Woods is a 50 y.o. male with history of NSTEMI>>STEMI w/ DES x 2 to the LAD 03/27/2014, med rx for mod RCA dz, ICM w/ EF 25-30% w/ LifeVest, apical thrombus treated with coumadin for a few months and stopped. Rx w/ ASA, Brilinta now changed to Plavix, Coreg and statin came here today for a follow up visit. He has done well post cardiac event, following up with Cardiologist with no new recommendation. Patient verbalized frustration with life saying he is financially distressed, no job, almost homeless, girlfriend deserted him and he was not approve for disability. He is anxious about "what to come", does not sleep well because of anxiety. He however denies any suicidal ideation or thoughts. He continues to smokes cigarette but at a reduced rate. Patient has No headache, No chest pain, No abdominal pain - No Nausea, No new weakness tingling or numbness, No Cough - SOB. Patient needs refill on his medications.  No problems updated.  ALLERGIES: No Known Allergies  PAST MEDICAL HISTORY: Past Medical History:  Diagnosis Date  . Hyperlipidemia LDL goal <70 03/27/2014  . Hypertension   . Ischemic cardiomyopathy 03/27/2014   EF 30% at cath, confirmed by echo  . Multiple lung nodules on CT 03/28/2014   Right lung nodules up to 6 mm, repeat CT in 6-12 months  . 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 NSTEMI (non-ST elevated myocardial infarction) (HCC) 03/26/2014   Patient pain increased and he developed ECG changes, taken to the cath lab as a STEMI, PCI to the LAD    MEDICATIONS AT HOME: Prior to Admission medications   Medication Sig Start Date End Date Taking? Authorizing Provider  aspirin 81 MG tablet Take 1 tablet (81 mg total) by mouth daily. 03/05/15   Quentin Angstlugbemiga E Dayan Kreis, MD  atorvastatin (LIPITOR) 80 MG tablet Take  1 tablet (80 mg total) by mouth daily. 12/24/15   Quentin Angstlugbemiga E Amiyah Shryock, MD  busPIRone (BUSPAR) 30 MG tablet Take 1 tablet (30 mg total) by mouth 2 (two) times daily. 12/24/15   Quentin Angstlugbemiga E Baylor Teegarden, MD  carvedilol (COREG) 12.5 MG tablet Take 1 tablet (12.5 mg total) by mouth 2 (two) times daily with a meal. 12/24/15   Quentin Angstlugbemiga E Jenefer Woerner, MD  clopidogrel (PLAVIX) 75 MG tablet Take 1 tablet (75 mg total) by mouth daily. 12/24/15   Quentin Angstlugbemiga E Kae Lauman, MD  lisinopril (PRINIVIL,ZESTRIL) 5 MG tablet Take 1 tablet (5 mg total) by mouth daily. 12/24/15   Quentin Angstlugbemiga E Calob Baskette, MD  nicotine (NICODERM CQ - DOSED IN MG/24 HOURS) 14 mg/24hr patch Place 1 patch (14 mg total) onto the skin daily. 03/05/15   Quentin Angstlugbemiga E Annastasia Haskins, MD  sildenafil (VIAGRA) 50 MG tablet Take 1 tablet (50 mg total) by mouth daily as needed for erectile dysfunction. 10/22/15   Quentin Angstlugbemiga E Nikoli Nasser, MD  traZODone (DESYREL) 50 MG tablet Take 0.5-1 tablets (25-50 mg total) by mouth at bedtime as needed for sleep. 12/24/15   Quentin Angstlugbemiga E Cloa Bushong, MD    Objective:   Vitals:   12/24/15 1216  BP: 117/76  Pulse: 79  Resp: 18  Temp: 98 F (36.7 C)  TempSrc: Oral  SpO2: 98%  Weight: 211 lb 3.2 oz (95.8 kg)  Height: 6\' 1"  (1.854 m)   Exam General appearance : Awake,  alert, not in any distress. Speech Clear. Not toxic looking HEENT: Atraumatic and Normocephalic, pupils equally reactive to light and accomodation Neck: Supple, no JVD. No cervical lymphadenopathy.  Chest: Good air entry bilaterally, no added sounds  CVS: S1 S2 regular, no murmurs.  Abdomen: Bowel sounds present, Non tender and not distended with no gaurding, rigidity or rebound. Extremities: B/L Lower Ext shows no edema, both legs are warm to touch Neurology: Awake alert, and oriented X 3, CN II-XII intact, Non focal Skin: No Rash  Data Review No results found for: HGBA1C  Assessment & Plan   1. Adjustment disorder with mixed anxiety and depressed mood  - traZODone  (DESYREL) 50 MG tablet; Take 0.5-1 tablets (25-50 mg total) by mouth at bedtime as needed for sleep.  Dispense: 30 tablet; Refill: 3 - busPIRone (BUSPAR) 30 MG tablet; Take 1 tablet (30 mg total) by mouth 2 (two) times daily.  Dispense: 60 tablet; Refill: 3 - Referred to our LCSW today for counseling and support.  2. Cigarette nicotine dependence with withdrawal  Thomas Woods was counseled on the dangers of tobacco use, and was advised to quit. Reviewed strategies to maximize success, including removing cigarettes and smoking materials from environment, stress management and support of family/friends.  3. Cardiomyopathy, ischemic  - lisinopril (PRINIVIL,ZESTRIL) 5 MG tablet; Take 1 tablet (5 mg total) by mouth daily.  Dispense: 90 tablet; Refill: 3 - clopidogrel (PLAVIX) 75 MG tablet; Take 1 tablet (75 mg total) by mouth daily.  Dispense: 90 tablet; Refill: 3 - carvedilol (COREG) 12.5 MG tablet; Take 1 tablet (12.5 mg total) by mouth 2 (two) times daily with a meal.  Dispense: 180 tablet; Refill: 3 - atorvastatin (LIPITOR) 80 MG tablet; Take 1 tablet (80 mg total) by mouth daily.  Dispense: 90 tablet; Refill: 3  Patient have been counseled extensively about nutrition and exercise. Other issues discussed during this visit include: low cholesterol diet, weight control and daily exercise, foot care, annual eye examinations at Ophthalmology, importance of adherence with medications and regular follow-up.  Return in about 3 months (around 03/23/2016) for Follow up Pain and comorbidities, Generalized Anxiety Disorder.  The patient was given clear instructions to go to ER or return to medical center if symptoms don't improve, worsen or new problems develop. The patient verbalized understanding. The patient was told to call to get lab results if they haven't heard anything in the next week.   This note has been created with Education officer, environmentalDragon speech recognition software and smart phrase technology. Any transcriptional errors  are unintentional.    Jeanann LewandowskyJEGEDE, Liridona Mashaw, MD, MHA, FACP, FAAP, CPE [facility] [place], [place] [phone]   12/24/2015, 12:56 PM

## 2015-12-24 NOTE — Progress Notes (Signed)
Patient here for FU  Patient denies pain at this time.  Patient has taken medication today. Patient has eaten today.  Patient declined flu vaccine.

## 2015-12-24 NOTE — Patient Instructions (Signed)
Generalized Anxiety Disorder Generalized anxiety disorder (GAD) is a mental disorder. It interferes with life functions, including relationships, work, and school. GAD is different from normal anxiety, which everyone experiences at some point in their lives in response to specific life events and activities. Normal anxiety actually helps us prepare for and get through these life events and activities. Normal anxiety goes away after the event or activity is over.  GAD causes anxiety that is not necessarily related to specific events or activities. It also causes excess anxiety in proportion to specific events or activities. The anxiety associated with GAD is also difficult to control. GAD can vary from mild to severe. People with severe GAD can have intense waves of anxiety with physical symptoms (panic attacks).  SYMPTOMS The anxiety and worry associated with GAD are difficult to control. This anxiety and worry are related to many life events and activities and also occur more days than not for 6 months or longer. People with GAD also have three or more of the following symptoms (one or more in children):  Restlessness.   Fatigue.  Difficulty concentrating.   Irritability.  Muscle tension.  Difficulty sleeping or unsatisfying sleep. DIAGNOSIS GAD is diagnosed through an assessment by your health care provider. Your health care provider will ask you questions aboutyour mood,physical symptoms, and events in your life. Your health care provider may ask you about your medical history and use of alcohol or drugs, including prescription medicines. Your health care provider may also do a physical exam and blood tests. Certain medical conditions and the use of certain substances can cause symptoms similar to those associated with GAD. Your health care provider may refer you to a mental health specialist for further evaluation. TREATMENT The following therapies are usually used to treat GAD:    Medication. Antidepressant medication usually is prescribed for long-term daily control. Antianxiety medicines may be added in severe cases, especially when panic attacks occur.   Talk therapy (psychotherapy). Certain types of talk therapy can be helpful in treating GAD by providing support, education, and guidance. A form of talk therapy called cognitive behavioral therapy can teach you healthy ways to think about and react to daily life events and activities.  Stress managementtechniques. These include yoga, meditation, and exercise and can be very helpful when they are practiced regularly. A mental health specialist can help determine which treatment is best for you. Some people see improvement with one therapy. However, other people require a combination of therapies. This information is not intended to replace advice given to you by your health care provider. Make sure you discuss any questions you have with your health care provider. Document Released: 04/23/2012 Document Revised: 01/17/2014 Document Reviewed: 04/23/2012 Elsevier Interactive Patient Education  2017 Elsevier Inc.  

## 2016-01-06 MED FILL — ?CARVEDILOL 12.5 MG TABLET: 12.5 | 30 days supply | Qty: 60 | Fill #8

## 2016-01-06 MED FILL — $Viagra 50mg tablet: 50 | 30 days supply | Qty: 10 | Fill #2

## 2016-01-06 MED FILL — CLOPIDOGREL 75 MG TABLET: 75 | 30 days supply | Qty: 30 | Fill #1

## 2016-01-07 MED FILL — ?BUSPIRONE HCL 30 MG TABLET: 30 | 30 days supply | Qty: 60 | Fill #0

## 2016-01-07 MED FILL — traZODone HCL 50 MG TABS: 50 | 30 days supply | Qty: 30 | Fill #0

## 2016-01-25 MED FILL — ATORVASTATIN 80 MG TABLET: 80 | 30 days supply | Qty: 30 | Fill #9

## 2016-01-25 MED FILL — LISINOPRIL 5 MG TABLET: 5 | 30 days supply | Qty: 30 | Fill #9

## 2016-02-02 MED FILL — $Viagra 50mg tablet: 50 | 30 days supply | Qty: 10 | Fill #3

## 2016-02-02 MED FILL — CLOPIDOGREL 75 MG TABLET: 75 | 30 days supply | Qty: 30 | Fill #2

## 2016-02-23 ENCOUNTER — Telehealth: Payer: Self-pay | Admitting: Interventional Cardiology

## 2016-02-23 NOTE — Telephone Encounter (Signed)
Patient is complaining of left lower stomach pain for 15 minutes. The patient states that it feels like gas pains, but his wife wanted him to call. The patient denies any chest pain, arm or jaw pain, lightheadedness, dizziness, or SOB. The patient states that it does not feel like indigestion. He states that it feels more like gas. The patient states that he has taken a tums. Patient was advised to follow up with his PCP if his stomach pain did not resolve. Patient was advised to call us back if he developed any chest pain, arm or jaw pain, lightheadedness, dizziness, or SOB. Patient verbalized understanding and is in agreement with this plan.

## 2016-02-23 NOTE — Telephone Encounter (Signed)
New message    Pt is calling complaining of sharp pains on his L side and stomach. He states he is having no other symptoms.

## 2016-03-03 MED FILL — CLOPIDOGREL 75 MG TABLET: 75 | 30 days supply | Qty: 30 | Fill #3

## 2016-03-03 MED FILL — ?CARVEDILOL 12.5 MG TABLET: 12.5 | 30 days supply | Qty: 60 | Fill #9

## 2016-03-03 MED FILL — ATORVASTATIN 80 MG TABLET: 80 | 30 days supply | Qty: 30 | Fill #10

## 2016-03-08 ENCOUNTER — Other Ambulatory Visit: Payer: Self-pay | Admitting: Internal Medicine

## 2016-03-08 DIAGNOSIS — I255 Ischemic cardiomyopathy: Secondary | ICD-10-CM

## 2016-03-08 MED FILL — $Viagra 50mg tablet: 50 | 30 days supply | Qty: 10 | Fill #4

## 2016-04-05 ENCOUNTER — Other Ambulatory Visit: Payer: Self-pay | Admitting: Internal Medicine

## 2016-04-05 DIAGNOSIS — I255 Ischemic cardiomyopathy: Secondary | ICD-10-CM

## 2016-04-05 MED FILL — ?LISINOPRIL 5 MG TABLET: 5 | 30 days supply | Qty: 30 | Fill #0

## 2016-04-05 MED FILL — ATORVASTATIN 80 MG TABLET: 80 | 30 days supply | Qty: 30 | Fill #0

## 2016-04-05 MED FILL — CLOPIDOGREL 75 MG TABLET: 75 | 30 days supply | Qty: 30 | Fill #4

## 2016-04-06 MED FILL — $Viagra 50mg tablet: 50 | 30 days supply | Qty: 10 | Fill #0

## 2016-04-18 MED FILL — CARVEDILOL 12.5 MG TABLET: 12.5 | 60 days supply | Qty: 60 | Fill #0

## 2016-04-18 MED FILL — traZODone HCL 50 MG TABS: 50 | 30 days supply | Qty: 30 | Fill #1

## 2016-05-04 MED FILL — ATORVASTATIN 80 MG TABLET: 80 | 30 days supply | Qty: 30 | Fill #1

## 2016-05-04 MED FILL — ?LISINOPRIL 5 MG TABLET: 5 | 30 days supply | Qty: 30 | Fill #1

## 2016-05-19 MED FILL — $Viagra 50mg tablet: 50 | 30 days supply | Qty: 10 | Fill #1

## 2016-05-25 MED FILL — CLOPIDOGREL 75 MG TABLET: 75 | 30 days supply | Qty: 30 | Fill #5

## 2016-05-31 MED FILL — ?CARVEDILOL 12.5 MG TABLET: 12.5 | 30 days supply | Qty: 60 | Fill #1

## 2016-06-07 MED FILL — ?LISINOPRIL 5 MG TABLET: 5 | 30 days supply | Qty: 30 | Fill #2

## 2016-06-07 MED FILL — ATORVASTATIN 80 MG TABLET: 80 | 30 days supply | Qty: 30 | Fill #2

## 2016-06-10 MED FILL — ?CARVEDILOL 12.5 MG TABLET: 12.5 | 30 days supply | Qty: 60 | Fill #2

## 2016-06-21 MED FILL — $Viagra 50mg tablet: 50 | 30 days supply | Qty: 10 | Fill #2

## 2016-06-21 MED FILL — ?CLOPIDOGREL 75 MG TABLET: 75 | 30 days supply | Qty: 30 | Fill #6

## 2016-06-26 IMAGING — CT CT CHEST W/O CM
2 of 4 series · 15 of 36 positions shown, 18 images · non-contrast
Comparison: 03/28/2014

CLINICAL DATA: Followup lung nodule.

EXAM:
CT CHEST WITHOUT CONTRAST
TECHNIQUE: Multidetector CT imaging of the chest was performed following the
standard protocol without IV contrast.

[Series 4: chest w/o 1mm st · axial · non-contrast · 0.85mm/px · z∈[+947,+1263]mm · 12 of 433 slices shown, 15 images]
[im 19/433  mediastinal]
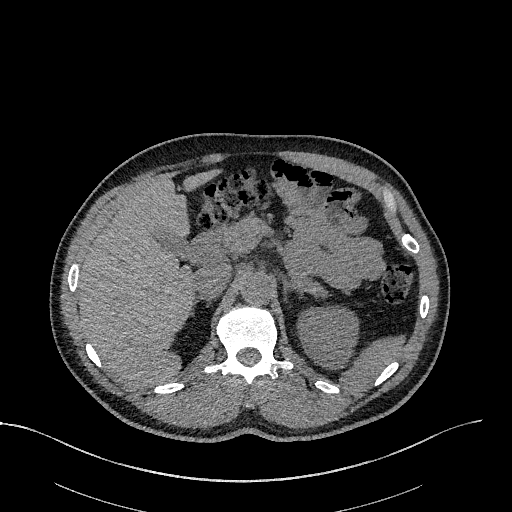
[im 19/433  lung]
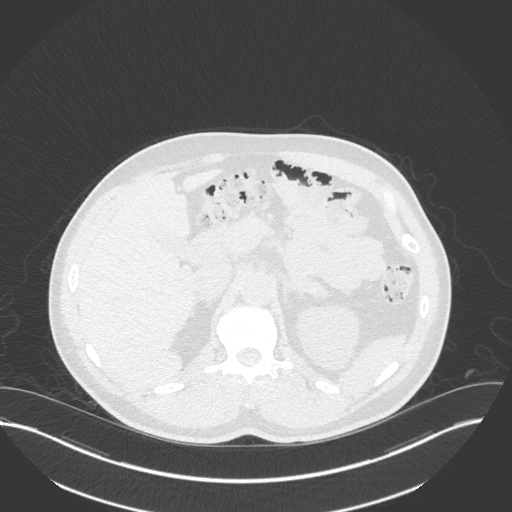
[im 57/433  lung]
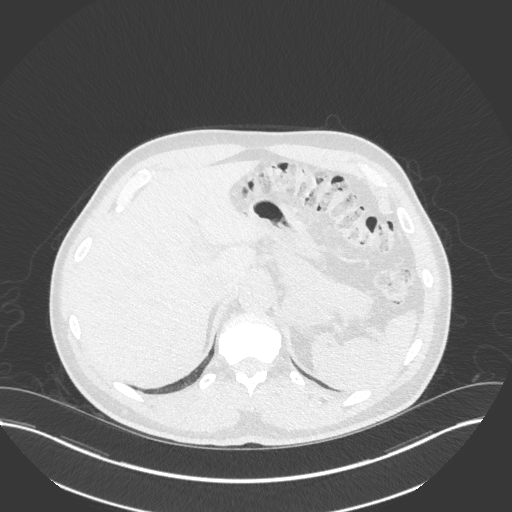
[im 94/433  lung]
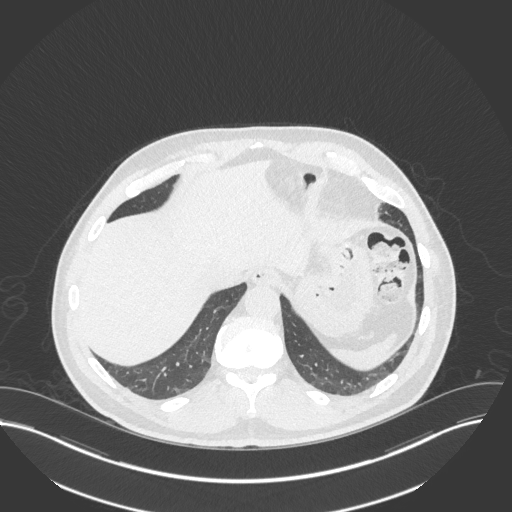
[im 132/433  lung]
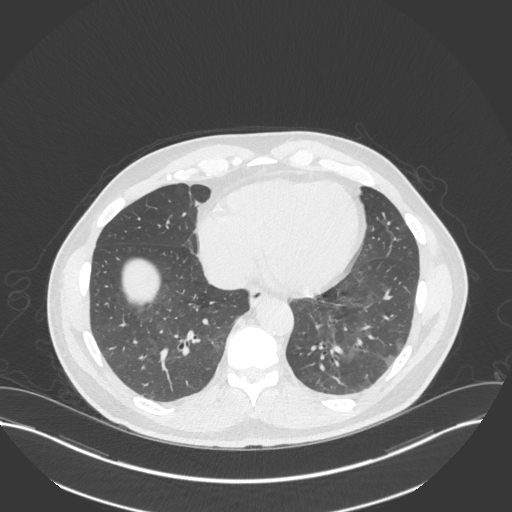
[im 170/433  mediastinal]
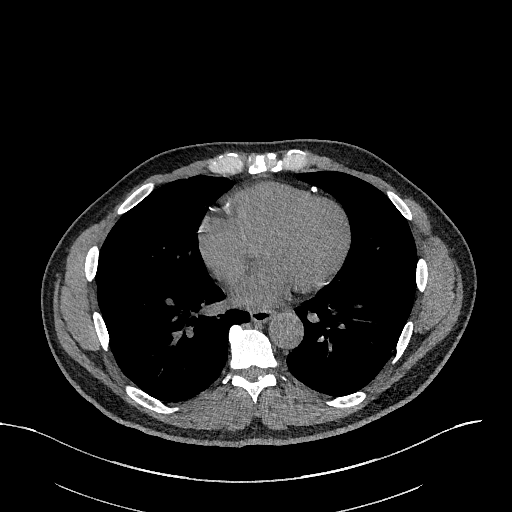
[im 170/433  lung]
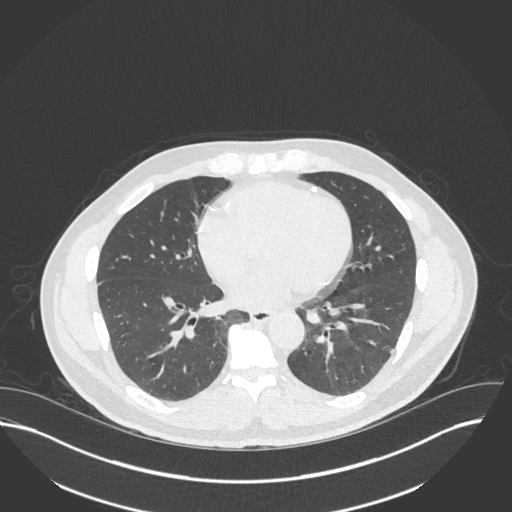
[im 207/433  lung]
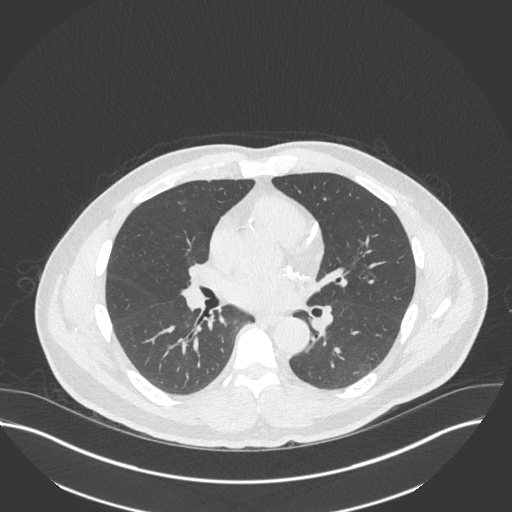
[im 226/433  lung]
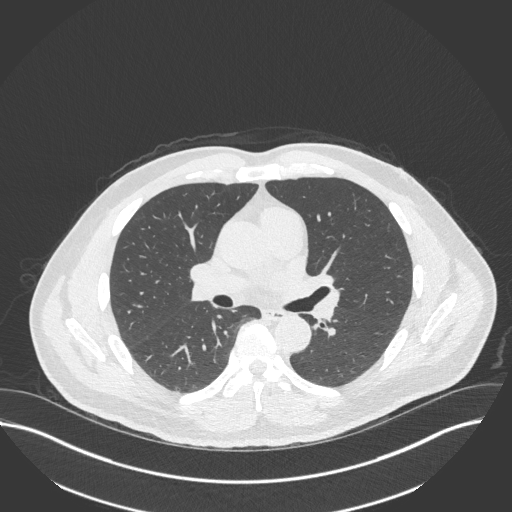
[im 263/433  lung]
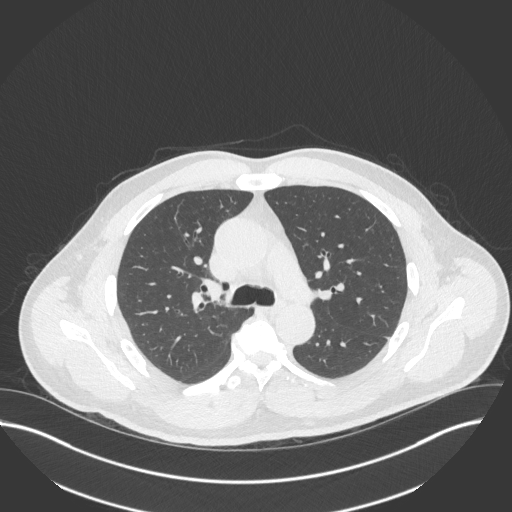
[im 301/433  mediastinal]
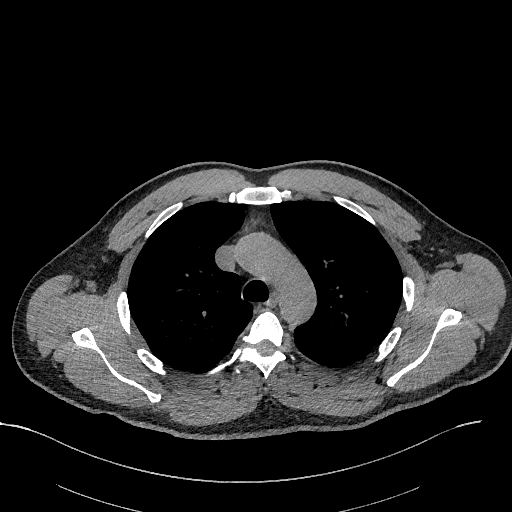
[im 301/433  lung]
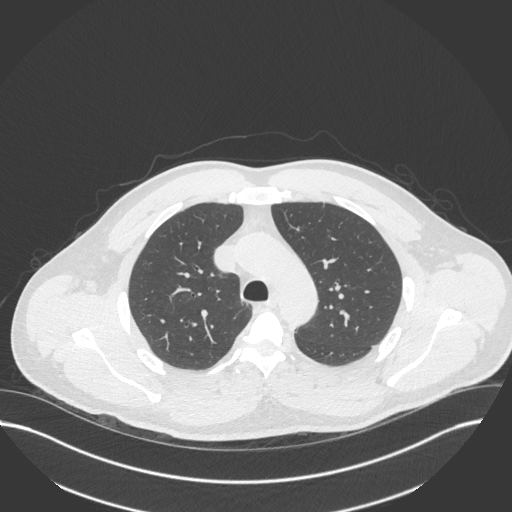
[im 339/433  lung]
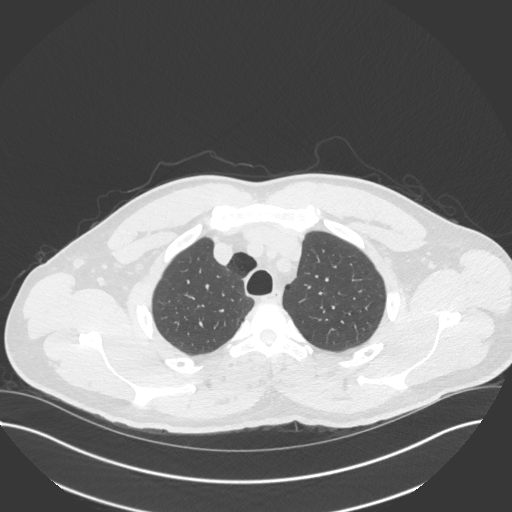
[im 376/433  lung]
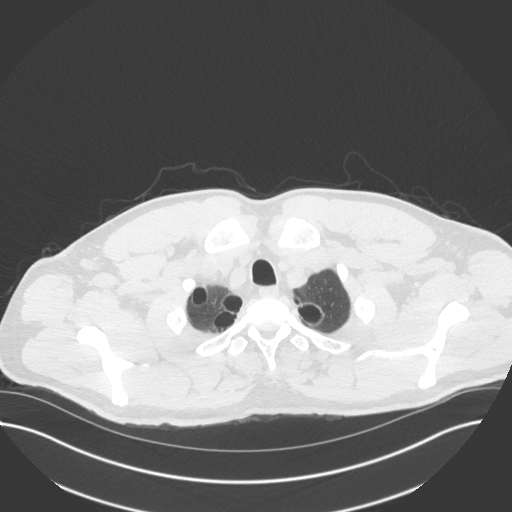
[im 414/433  lung]
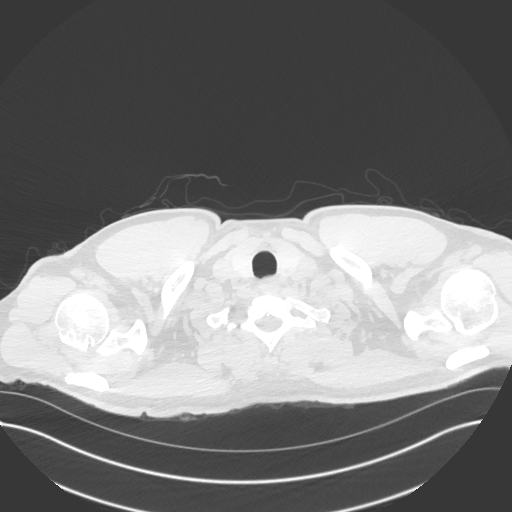

[Series 5: chest w/o 3mm st cor · coronal · non-contrast · 0.70mm/px · 3 of 83 slices shown]
[im 17/83  lung]
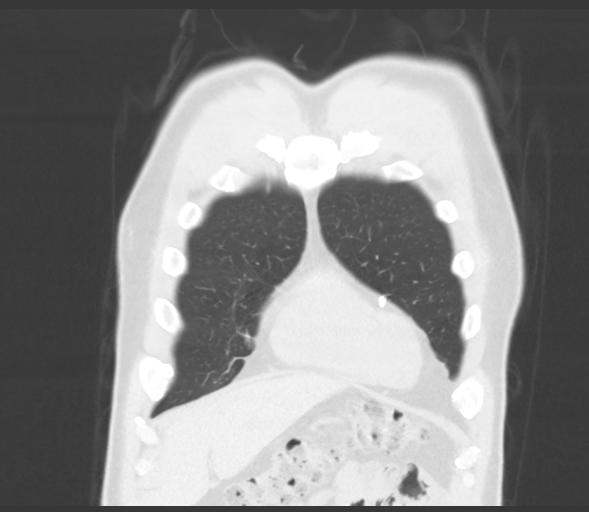
[im 33/83  lung]
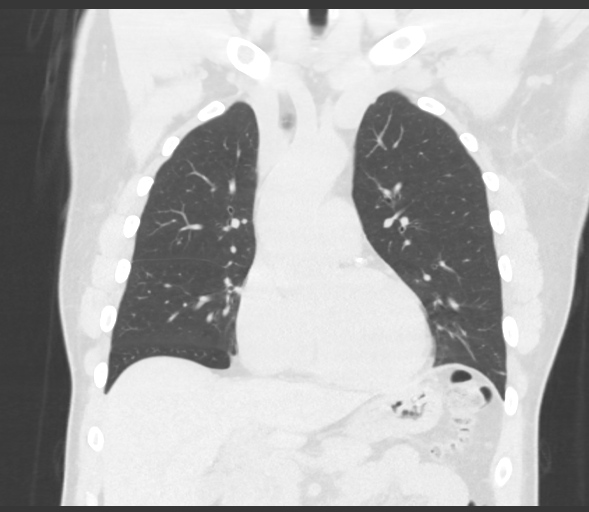
[im 50/83  lung]
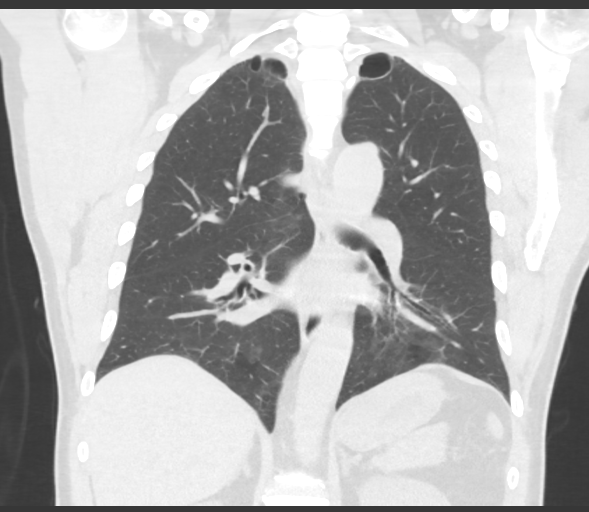

[15 of 36 positions shown; findings below may reference images not displayed]

FINDINGS: Previously seen right lung nodules have resolved. No pulmonary
nodules currently. No pleural effusions or confluent airspace
opacities.

Heart is normal size. Aorta is normal caliber. Stent noted within
the left anterior descending coronary artery. Coronary artery
calcifications noted. No mediastinal, hilar, or axillary adenopathy.
Chest wall soft tissues are unremarkable. Imaging into the upper
abdomen shows no acute findings.

No acute bony abnormality or focal bone lesion.
IMPRESSION: Resolution of the previously seen right lung nodules, likely
inflammatory.

Coronary artery disease.  Prior LAD stenting.

## 2016-07-14 MED FILL — $Viagra 50mg tablet: 50 | 30 days supply | Qty: 10 | Fill #3

## 2016-07-20 MED FILL — ?CLOPIDOGREL 75 MG TABLET: 75 | 30 days supply | Qty: 30 | Fill #7

## 2016-07-20 MED FILL — ?LISINOPRIL 5 MG TABLET: 5 | 30 days supply | Qty: 30 | Fill #3

## 2016-07-20 MED FILL — ATORVASTATIN 80 MG TABLET: 80 | 30 days supply | Qty: 30 | Fill #3

## 2016-07-20 MED FILL — ?CARVEDILOL 12.5 MG TABLET: 12.5 | 30 days supply | Qty: 60 | Fill #3

## 2016-08-03 ENCOUNTER — Encounter: Payer: Self-pay | Admitting: Internal Medicine

## 2016-08-03 ENCOUNTER — Ambulatory Visit: Payer: Self-pay | Attending: Internal Medicine | Admitting: Internal Medicine

## 2016-08-03 VITALS — BP 111/80 | HR 66 | Temp 98.6°F | Resp 18 | Ht 72.0 in | Wt 203.4 lb

## 2016-08-03 DIAGNOSIS — I10 Essential (primary) hypertension: Secondary | ICD-10-CM | POA: Insufficient documentation

## 2016-08-03 DIAGNOSIS — F17200 Nicotine dependence, unspecified, uncomplicated: Secondary | ICD-10-CM

## 2016-08-03 DIAGNOSIS — Z7982 Long term (current) use of aspirin: Secondary | ICD-10-CM | POA: Insufficient documentation

## 2016-08-03 DIAGNOSIS — I252 Old myocardial infarction: Secondary | ICD-10-CM | POA: Insufficient documentation

## 2016-08-03 DIAGNOSIS — E785 Hyperlipidemia, unspecified: Secondary | ICD-10-CM | POA: Insufficient documentation

## 2016-08-03 DIAGNOSIS — Z79899 Other long term (current) drug therapy: Secondary | ICD-10-CM | POA: Insufficient documentation

## 2016-08-03 DIAGNOSIS — R918 Other nonspecific abnormal finding of lung field: Secondary | ICD-10-CM | POA: Insufficient documentation

## 2016-08-03 DIAGNOSIS — I255 Ischemic cardiomyopathy: Secondary | ICD-10-CM | POA: Insufficient documentation

## 2016-08-03 DIAGNOSIS — Z7902 Long term (current) use of antithrombotics/antiplatelets: Secondary | ICD-10-CM | POA: Insufficient documentation

## 2016-08-03 DIAGNOSIS — G8929 Other chronic pain: Secondary | ICD-10-CM

## 2016-08-03 DIAGNOSIS — M546 Pain in thoracic spine: Secondary | ICD-10-CM | POA: Insufficient documentation

## 2016-08-03 DIAGNOSIS — F4323 Adjustment disorder with mixed anxiety and depressed mood: Secondary | ICD-10-CM | POA: Insufficient documentation

## 2016-08-03 MED ORDER — VARENICLINE TARTRATE 1 MG PO TABS: 1.0000 mg | ORAL_TABLET | Freq: Two times a day (BID) | ORAL | 3 refills | Status: DC

## 2016-08-03 MED ORDER — LISINOPRIL 5 MG PO TABS: 5.0000 mg | ORAL_TABLET | Freq: Every day | ORAL | 3 refills | Status: DC

## 2016-08-03 MED ORDER — CLOPIDOGREL BISULFATE 75 MG PO TABS: 75.0000 mg | ORAL_TABLET | Freq: Every day | ORAL | 3 refills | Status: DC

## 2016-08-03 MED ORDER — METHOCARBAMOL 500 MG PO TABS: 500.0000 mg | ORAL_TABLET | Freq: Four times a day (QID) | ORAL | 0 refills | Status: DC

## 2016-08-03 MED ORDER — ATORVASTATIN CALCIUM 80 MG PO TABS: 80.0000 mg | ORAL_TABLET | Freq: Every day | ORAL | 3 refills | Status: DC

## 2016-08-03 MED ORDER — VARENICLINE TARTRATE 0.5 MG PO TABS: 0.5000 mg | ORAL_TABLET | Freq: Two times a day (BID) | ORAL | 0 refills | Status: DC

## 2016-08-03 MED ORDER — BUSPIRONE HCL 10 MG PO TABS: 10.0000 mg | ORAL_TABLET | Freq: Two times a day (BID) | ORAL | 3 refills | Status: DC

## 2016-08-03 MED ORDER — CARVEDILOL 12.5 MG PO TABS: 12.5000 mg | ORAL_TABLET | Freq: Two times a day (BID) | ORAL | 3 refills | Status: DC

## 2016-08-03 MED ORDER — TRAZODONE HCL 50 MG PO TABS: 25.0000 mg | ORAL_TABLET | Freq: Every evening | ORAL | 3 refills | Status: DC | PRN

## 2016-08-03 MED FILL — traZODone HCL 50 MG TABS: 50 | 30 days supply | Qty: 30 | Fill #0

## 2016-08-03 MED FILL — !CHANTIX CONT MONTH BOX: 1 | 28 days supply | Qty: 56 | Fill #0

## 2016-08-03 MED FILL — ?BUSPIRONE HCL 10 MG TABLET: 10 | 30 days supply | Qty: 60 | Fill #0

## 2016-08-03 NOTE — Progress Notes (Signed)
Patient has eaten  °Patient has had medication  °

## 2016-08-03 NOTE — Progress Notes (Signed)
Thomas Woods, is a 51 y.o. male  ZOX:[id number]  [id number]:[id number]  DOB - Dec 15, 1965  Chief Complaint  Patient presents with  . Follow-up       Subjective:   Thomas Woods is a 51 y.o. male with history of NSTEMI>>STEMI w/ DES x 2 to the LAD 03/27/2014, med Rx for mod RCA dz, ICM w/ EF 25-30%, apical thrombus treated with coumadin for a few months and stopped. Rx w/ ASA, Brilinta now changed to Plavix, Coreg and statin, last ECHO on 07/07/2014 with LVEF of 5. - 55% presents here today for a follow up visit and medication refills. Patient is now ready to quit smoking entirely, requesting to be started back on Chantix. Major complaint today is chronic left-sided back pain around the left scapular area. Does not affect his activities of daily living. Patient has done very well post cardiac event, he follows up with cardiologist. He claims he is feeling much better with his depression, he denies any suicidal thoughts or ideation. He needs refill on all his medications today. Patient has No headache, No chest pain, No abdominal pain - No Nausea, No new weakness tingling or numbness, No Cough - SOB. Patient is due for colonoscopy but he would like to delay it till next visit.  Problem  Smoking Addiction  Adjustment Disorder With Mixed Anxiety and Depressed Mood    ALLERGIES: No Known Allergies  PAST MEDICAL HISTORY: Past Medical History:  Diagnosis Date  . Hyperlipidemia LDL goal <70 03/27/2014  . Hypertension   . Ischemic cardiomyopathy 03/27/2014   EF 30% at cath, confirmed by echo  . Multiple lung nodules on CT 03/28/2014   Right lung nodules up to 6 mm, repeat CT in 6-12 months  . 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 NSTEMI (non-ST elevated myocardial infarction) (HCC) 03/26/2014   Patient pain increased and he developed ECG changes, taken to the cath lab as a STEMI, PCI to the LAD    MEDICATIONS AT HOME: Prior to Admission medications     Medication Sig Start Date End Date Taking? Authorizing Provider  aspirin 81 MG tablet Take 1 tablet (81 mg total) by mouth daily. 03/05/15  Yes Quentin Angst, MD  atorvastatin (LIPITOR) 80 MG tablet Take 1 tablet (80 mg total) by mouth daily. 08/03/16  Yes Shawntavia Saunders, Phylliss Blakes, MD  busPIRone (BUSPAR) 10 MG tablet Take 1 tablet (10 mg total) by mouth 2 (two) times daily. 08/03/16  Yes Quentin Angst, MD  carvedilol (COREG) 12.5 MG tablet Take 1 tablet (12.5 mg total) by mouth 2 (two) times daily with a meal. 08/03/16  Yes Cleone Hulick E, MD  clopidogrel (PLAVIX) 75 MG tablet Take 1 tablet (75 mg total) by mouth daily. 08/03/16  Yes Quentin Angst, MD  lisinopril (PRINIVIL,ZESTRIL) 5 MG tablet Take 1 tablet (5 mg total) by mouth daily. 08/03/16  Yes Quentin Angst, MD  sildenafil (VIAGRA) 50 MG tablet Take 1 tablet (50 mg total) by mouth daily as needed for erectile dysfunction. 10/22/15  Yes Quentin Angst, MD  traZODone (DESYREL) 50 MG tablet Take 0.5-1 tablets (25-50 mg total) by mouth at bedtime as needed for sleep. 08/03/16  Yes Quentin Angst, MD  methocarbamol (ROBAXIN) 500 MG tablet Take 1 tablet (500 mg total) by mouth 4 (four) times daily. 08/03/16   Quentin Angst, MD  nicotine (NICODERM CQ - DOSED IN MG/24 HOURS) 14 mg/24hr patch Place 1 patch (  14 mg total) onto the skin daily. Patient not taking: Reported on 08/03/2016 03/05/15   Quentin AngstJegede, Bryn Saline E, MD  varenicline (CHANTIX CONTINUING MONTH PAK) 1 MG tablet Take 1 tablet (1 mg total) by mouth 2 (two) times daily. 08/03/16   Quentin AngstJegede, Raynah Gomes E, MD  varenicline (CHANTIX) 0.5 MG tablet Take 1 tablet (0.5 mg total) by mouth 2 (two) times daily. 08/03/16   Quentin AngstJegede, Jerrod Damiano E, MD    Objective:   Vitals:   08/03/16 1435  BP: 111/80  Pulse: 66  Resp: 18  Temp: 98.6 F (37 C)  TempSrc: Oral  SpO2: 99%  Weight: 203 lb 6.4 oz (92.3 kg)  Height: 6' (1.829 m)   Exam General appearance : Awake,  alert, not in any distress. Speech Clear. Not toxic looking HEENT: Atraumatic and Normocephalic, pupils equally reactive to light and accomodation Neck: Supple, no JVD. No cervical lymphadenopathy.  Chest: Good air entry bilaterally, no added sounds  CVS: S1 S2 regular, no murmurs.  Abdomen: Bowel sounds present, Non tender and not distended with no gaurding, rigidity or rebound. Extremities: B/L Lower Ext shows no edema, both legs are warm to touch Neurology: Awake alert, and oriented X 3, CN II-XII intact, Non focal Skin: No Rash  Data Review No results found for: HGBA1C  Assessment & Plan   1. Adjustment disorder with mixed anxiety and depressed mood  - busPIRone (BUSPAR) 10 MG tablet; Take 1 tablet (10 mg total) by mouth 2 (two) times daily.  Dispense: 60 tablet; Refill: 3 - traZODone (DESYREL) 50 MG tablet; Take 0.5-1 tablets (25-50 mg total) by mouth at bedtime as needed for sleep.  Dispense: 30 tablet; Refill: 3  2. Cardiomyopathy, ischemic  - atorvastatin (LIPITOR) 80 MG tablet; Take 1 tablet (80 mg total) by mouth daily.  Dispense: 90 tablet; Refill: 3 - carvedilol (COREG) 12.5 MG tablet; Take 1 tablet (12.5 mg total) by mouth 2 (two) times daily with a meal.  Dispense: 180 tablet; Refill: 3 - clopidogrel (PLAVIX) 75 MG tablet; Take 1 tablet (75 mg total) by mouth daily.  Dispense: 90 tablet; Refill: 3 - lisinopril (PRINIVIL,ZESTRIL) 5 MG tablet; Take 1 tablet (5 mg total) by mouth daily.  Dispense: 90 tablet; Refill: 3  3. Smoking addiction  - varenicline (CHANTIX) 0.5 MG tablet; Take 1 tablet (0.5 mg total) by mouth 2 (two) times daily.  Dispense: 14 tablet; Refill: 0 - varenicline (CHANTIX CONTINUING MONTH PAK) 1 MG tablet; Take 1 tablet (1 mg total) by mouth 2 (two) times daily.  Dispense: 60 tablet; Refill: 3  Michelle PiperGuy was counseled on the dangers of tobacco use, and was advised to quit. Reviewed strategies to maximize success, including removing cigarettes and smoking  materials from environment, stress management and support of family/friends.  4. Chronic left-sided thoracic back pain  - methocarbamol (ROBAXIN) 500 MG tablet; Take 1 tablet (500 mg total) by mouth 4 (four) times daily.  Dispense: 60 tablet; Refill: 0  Patient have been counseled extensively about nutrition and exercise. Other issues discussed during this visit include: low cholesterol diet, weight control and daily exercise, importance of adherence with medications and regular follow-up. We also discussed long term complications of uncontrolled hypertension.   Return in about 3 months (around 11/03/2016) for Annual Physical, Colonoscopy Schedule.  The patient was given clear instructions to go to ER or return to medical center if symptoms don't improve, worsen or new problems develop. The patient verbalized understanding. The patient was told to call to get lab  results if they haven't heard anything in the next week.   This note has been created with Education officer, environmentalDragon speech recognition software and smart phrase technology. Any transcriptional errors are unintentional.    Jeanann LewandowskyJEGEDE, Jayvion Stefanski, MD, MHA, Maxwell CaulFACP, FAAP, CPE [facility] [place], [place] [phone]   08/03/2016, 3:40 PM

## 2016-08-03 NOTE — Patient Instructions (Signed)
Musculoskeletal Pain Musculoskeletal pain is muscle and bone aches and pains. This pain can occur in any part of the body. Follow these instructions at home:  Only take medicines for pain, discomfort, or fever as told by your health care provider.  You may continue all activities unless the activities cause more pain. When the pain lessens, slowly resume normal activities. Gradually increase the intensity and duration of the activities or exercise.  During periods of severe pain, bed rest may be helpful. Lie or sit in any position that is comfortable, but get out of bed and walk around at least every several hours.  If directed, put ice on the injured area. ? Put ice in a plastic bag. ? Place a towel between your skin and the bag. ? Leave the ice on for 20 minutes, 2-3 times a day. Contact a health care provider if:  Your pain is getting worse.  Your pain is not relieved with medicines.  You lose function in the area of the pain if the pain is in your arms, legs, or neck. This information is not intended to replace advice given to you by your health care provider. Make sure you discuss any questions you have with your health care provider. Document Released: 12/27/2004 Document Revised: 06/09/2015 Document Reviewed: 08/31/2012 Elsevier Interactive Patient Education  2017 [employer] Steps to Quit Smoking Smoking tobacco can be bad for your health. It can also affect almost every organ in your body. Smoking puts you and people around you at risk for many serious long-lasting (chronic) diseases. Quitting smoking is hard, but it is one of the best things that you can do for your health. It is never too late to quit. What are the benefits of quitting smoking? When you quit smoking, you lower your risk for getting serious diseases and conditions. They can include:  Lung cancer or lung disease.  Heart disease.  Stroke.  Heart attack.  Not being able to have children  (infertility).  Weak bones (osteoporosis) and broken bones (fractures).  If you have coughing, wheezing, and shortness of breath, those symptoms may get better when you quit. You may also get sick less often. If you are pregnant, quitting smoking can help to lower your chances of having a baby of low birth weight. What can I do to help me quit smoking? Talk with your doctor about what can help you quit smoking. Some things you can do (strategies) include:  Quitting smoking totally, instead of slowly cutting back how much you smoke over a period of time.  Going to in-person counseling. You are more likely to quit if you go to many counseling sessions.  Using resources and support systems, such as: ? Agricultural engineernline chats with a Veterinary surgeoncounselor. ? Phone quitlines. ? Automotive engineerrinted self-help materials. ? Support groups or group counseling. ? Text messaging programs. ? Mobile phone apps or applications.  Taking medicines. Some of these medicines may have nicotine in them. If you are pregnant or breastfeeding, do not take any medicines to quit smoking unless your doctor says it is okay. Talk with your doctor about counseling or other things that can help you.  Talk with your doctor about using more than one strategy at the same time, such as taking medicines while you are also going to in-person counseling. This can help make quitting easier. What things can I do to make it easier to quit? Quitting smoking might feel very hard at first, but there is a lot that you can do  to make it easier. Take these steps:  Talk to your family and friends. Ask them to support and encourage you.  Call phone quitlines, reach out to support groups, or work with a Veterinary surgeoncounselor.  Ask people who smoke to not smoke around you.  Avoid places that make you want (trigger) to smoke, such as: ? Bars. ? Parties. ? Smoke-break areas at work.  Spend time with people who do not smoke.  Lower the stress in your life. Stress can make you  want to smoke. Try these things to help your stress: ? Getting regular exercise. ? Deep-breathing exercises. ? Yoga. ? Meditating. ? Doing a body scan. To do this, close your eyes, focus on one area of your body at a time from head to toe, and notice which parts of your body are tense. Try to relax the muscles in those areas.  Download or buy apps on your mobile phone or tablet that can help you stick to your quit plan. There are many free apps, such as QuitGuide from the [employer] developer(Centers for Disease Control and Prevention). You can find more support from smokefree.gov and other websites.  This information is not intended to replace advice given to you by your health care provider. Make sure you discuss any questions you have with your health care provider. Document Released: 10/23/2008 Document Revised: 08/25/2015 Document Reviewed: 05/13/2014 Elsevier Interactive Patient Education  2018 [employer]

## 2016-08-23 MED FILL — ?CARVEDILOL 12.5 MG TABLET: 12.5 | 30 days supply | Qty: 60 | Fill #4

## 2016-08-23 MED FILL — ?CLOPIDOGREL 75 MG TABLET: 75 | 30 days supply | Qty: 30 | Fill #8

## 2016-08-23 MED FILL — ATORVASTATIN 80 MG TABLET: 80 | 30 days supply | Qty: 30 | Fill #4

## 2016-08-23 MED FILL — ?LISINOPRIL 5 MG TABLET: 5 | 30 days supply | Qty: 30 | Fill #4

## 2016-08-25 MED FILL — $Viagra 50mg tablet: 50 | 30 days supply | Qty: 10 | Fill #4

## 2016-08-28 NOTE — Progress Notes (Signed)
Cardiology Office Note   Date:  08/29/2016   ID:  Thomas Woods, DOB 04-Nov-1965, MRN [id number]  PCP:  Quentin Angst, MD    No chief complaint on file. CAD/MI   Wt Readings from Last 3 Encounters:  08/29/16 206 lb 6.4 oz (93.6 kg)  08/03/16 203 lb 6.4 oz (92.3 kg)  12/24/15 211 lb 3.2 oz (95.8 kg)       History of Present Illness: Thomas Woods is a 51 y.o. male  Who had an anterior MI in 3/16. Angina was a burning sensation.He had 2 long overlapping drug-eluting stents to the LAD. His EF was low at 30%. He had an apical thrombus, and was treated with warfarin for a few months. He has done well. His most recent EF is now low normal.  He has smoked cigarettes and tried the patch to stop smoking.  THis was unsuccessful.  Denies : Chest pain. Dizziness. Leg edema. Nitroglycerin use. Orthopnea. Palpitations. Paroxysmal nocturnal dyspnea. Syncope.   He did go back to Chantix and he has cut back significantly.  No suicidal thoughts, or any other change in thinking since starting.  He continues to walk a lot daily.  No CP.  Rare SHOB.  He has had some left shoulder discomfort when he lifts his arm.  Not related to walking.  He thinks he will not be getting partial disability.  He continues to have some financial issues.  Notes some gum bleeding when he brushes his teeth.  He bruises easily.  He has some stress from his 21 year old son. He has 5 daughters who are grown now.    Past Medical History:  Diagnosis Date  . Hyperlipidemia LDL goal <70 03/27/2014  . Hypertension   . Ischemic cardiomyopathy 03/27/2014   EF 30% at cath, confirmed by echo  . Multiple lung nodules on CT 03/28/2014   Right lung nodules up to 6 mm, repeat CT in 6-12 months  . 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 NSTEMI (non-ST elevated myocardial infarction) (HCC) 03/26/2014   Patient pain increased and he developed ECG changes, taken to the cath lab as  a STEMI, PCI to the LAD    Past Surgical History:  Procedure Laterality Date  . HERNIA REPAIR    . LEFT HEART CATHETERIZATION WITH CORONARY ANGIOGRAM N/A 03/27/2014   Procedure: LEFT HEART CATHETERIZATION WITH CORONARY ANGIOGRAM;  Surgeon: Corky Crafts, MD; L main okay, LAD 100%, CFX okay, RI moderate disease, OM1 mild disease, RCA moderate disease, EF 30%  . PERCUTANEOUS CORONARY STENT INTERVENTION (PCI-S)  03/27/2014   3.0 x 32 Synergy overlapped with 2.5 x 38 Synergy to the mLAD     Current Outpatient Prescriptions  Medication Sig Dispense Refill  . aspirin 81 MG tablet Take 1 tablet (81 mg total) by mouth daily. 30 tablet 3  . atorvastatin (LIPITOR) 80 MG tablet Take 1 tablet (80 mg total) by mouth daily. 90 tablet 3  . busPIRone (BUSPAR) 10 MG tablet Take 1 tablet (10 mg total) by mouth 2 (two) times daily. 60 tablet 3  . carvedilol (COREG) 12.5 MG tablet Take 1 tablet (12.5 mg total) by mouth 2 (two) times daily with a meal. 180 tablet 3  . clopidogrel (PLAVIX) 75 MG tablet Take 1 tablet (75 mg total) by mouth daily. 90 tablet 3  . lisinopril (PRINIVIL,ZESTRIL) 5 MG tablet Take 1 tablet (5 mg total) by mouth daily. 90 tablet 3  .  methocarbamol (ROBAXIN) 500 MG tablet Take 1 tablet (500 mg total) by mouth 4 (four) times daily. 60 tablet 0  . sildenafil (VIAGRA) 50 MG tablet Take 1 tablet (50 mg total) by mouth daily as needed for erectile dysfunction. 30 tablet 3  . traZODone (DESYREL) 50 MG tablet Take 0.5-1 tablets (25-50 mg total) by mouth at bedtime as needed for sleep. 30 tablet 3  . varenicline (CHANTIX CONTINUING MONTH PAK) 1 MG tablet Take 1 tablet (1 mg total) by mouth 2 (two) times daily. 60 tablet 3  . varenicline (CHANTIX) 0.5 MG tablet Take 1 tablet (0.5 mg total) by mouth 2 (two) times daily. 14 tablet 0   No current facility-administered medications for this visit.     Allergies:   Patient has no known allergies.    Social History:  The patient  reports that  he has been smoking Cigarettes.  He has been smoking about 0.20 packs per day. He has never used smokeless tobacco. He reports that he drinks alcohol. He reports that he does not use drugs.   Family History:  The patient's family history includes Healthy in his brother, mother, and sister.    ROS:  Please see the history of present illness.   Otherwise, review of systems are positive for smokes when stressed; diet has improved.   All other systems are reviewed and negative.    PHYSICAL EXAM: VS:  BP 118/88   Pulse 63   Ht 6' (1.829 m)   Wt 206 lb 6.4 oz (93.6 kg)   SpO2 99%   BMI 27.99 kg/m  , BMI Body mass index is 27.99 kg/m. GEN: Well nourished, well developed, in no acute distress  HEENT: normal  Neck: no JVD, carotid bruits, or masses Cardiac: RRR; no murmurs, rubs, or gallops,no edema  Respiratory:  clear to auscultation bilaterally, normal work of breathing GI: soft, nontender, nondistended, + BS; no pulsatile mass MS: no deformity or atrophy  Skin: warm and dry, no rash Neuro:  Strength and sensation are intact Psych: euthymic mood, full affect   EKG:   The ekg ordered today demonstrates NSR, no ST segment changes ( arm lead reversal today), anterior MI   Recent Labs: 12/07/2015: ALT 22; BUN 16; Creat 0.91; Potassium 4.2; Sodium 138   Lipid Panel    Component Value Date/Time   CHOL 167 12/07/2015 1053   TRIG 39 12/07/2015 1053   HDL 79 12/07/2015 1053   CHOLHDL 2.1 12/07/2015 1053   VLDL 8 12/07/2015 1053   LDLCALC 80 12/07/2015 1053     Other studies Reviewed: Additional studies/ records that were reviewed today with results demonstrating: cath results reviewed, lipids are reviewed.   ASSESSMENT AND PLAN:  1. CAD/MI: No angina on medical therapy. COntinue aggressive secondary prevention. COntinue regular exercise.  Will refill SL NTG tfor him to have as needed. 2. Hyperlipidemia: COntinue atorvastatin.  LDL controlled in 11/17.  Recheck lipids in a few  months. 3. HTN: COntrolled.  COntinue current medicines. 4. Tobacco abuse: Using Chantix to try to quit.  He has cut back quite a bit. 5. Erectile dysfunction: THere was some difficulty getting Viagra from the pharmacy, but eventually he got it and it is working well.   Current medicines are reviewed at length with the patient today.  The patient concerns regarding his medicines were addressed.  The following changes have been made:  No change  Labs/ tests ordered today include:  No orders of the defined types were placed  in this encounter.   Recommend 150 minutes/week of aerobic exercise Low fat, low carb, high fiber diet recommended  Disposition:   FU in 1 year   Signed, Lance Muss, MD  08/29/2016 9:04 AM    [facility] [address] [place], [place], [place]  [address] Phone: [phone]; Fax: [phone]

## 2016-08-29 ENCOUNTER — Encounter: Payer: Self-pay | Admitting: Interventional Cardiology

## 2016-08-29 ENCOUNTER — Ambulatory Visit (INDEPENDENT_AMBULATORY_CARE_PROVIDER_SITE_OTHER): Payer: Self-pay | Admitting: Interventional Cardiology

## 2016-08-29 ENCOUNTER — Telehealth: Payer: Self-pay | Admitting: Interventional Cardiology

## 2016-08-29 VITALS — BP 118/88 | HR 63 | Ht 72.0 in | Wt 206.4 lb

## 2016-08-29 DIAGNOSIS — E782 Mixed hyperlipidemia: Secondary | ICD-10-CM

## 2016-08-29 DIAGNOSIS — I10 Essential (primary) hypertension: Secondary | ICD-10-CM

## 2016-08-29 DIAGNOSIS — Z720 Tobacco use: Secondary | ICD-10-CM

## 2016-08-29 DIAGNOSIS — I25119 Atherosclerotic heart disease of native coronary artery with unspecified angina pectoris: Secondary | ICD-10-CM

## 2016-08-29 MED ORDER — NITROGLYCERIN 0.4 MG SL SUBL: 0.4000 mg | SUBLINGUAL_TABLET | SUBLINGUAL | 3 refills | Status: DC | PRN

## 2016-08-29 NOTE — Patient Instructions (Addendum)
Medication Instructions:  Your physician has recommended you make the following change in your medication:   TAKE Nitroglycerin Sublingual 0.4 mg every 5 minutes (up to 3 doses) as needed for chest pain     Labwork: Your physician recommends that you return for FASTING lab work in: November for CMET and LIPIDs   Testing/Procedures: None ordered  Follow-Up: Your physician wants you to follow-up in: 6 months with Dr. Eldridge Dace. You will receive a reminder letter in the mail two months in advance. If you don't receive a letter, please call our office to schedule the follow-up appointment.   Any Other Special Instructions Will Be Listed Below (If Applicable).  Nitroglycerin sublingual tablets What is this medicine? NITROGLYCERIN (nye troe GLI ser in) is a type of vasodilator. It relaxes blood vessels, increasing the blood and oxygen supply to your heart. This medicine is used to relieve chest pain caused by angina. It is also used to prevent chest pain before activities like climbing stairs, going outdoors in cold weather, or sexual activity. This medicine may be used for other purposes; ask your health care provider or pharmacist if you have questions. COMMON BRAND NAME(S): Nitroquick, Nitrostat, Nitrotab What should I tell my health care provider before I take this medicine? They need to know if you have any of these conditions: -anemia -head injury, recent stroke, or bleeding in the brain -liver disease -previous heart attack -an unusual or allergic reaction to nitroglycerin, other medicines, foods, dyes, or preservatives -pregnant or trying to get pregnant -breast-feeding How should I use this medicine? Take this medicine by mouth as needed. At the first sign of an angina attack (chest pain or tightness) place one tablet under your tongue. You can also take this medicine 5 to 10 minutes before an event likely to produce chest pain. Follow the directions on the prescription label.  Let the tablet dissolve under the tongue. Do not swallow whole. Replace the dose if you accidentally swallow it. It will help if your mouth is not dry. Saliva around the tablet will help it to dissolve more quickly. Do not eat or drink, smoke or chew tobacco while a tablet is dissolving. If you are not better within 5 minutes after taking ONE dose of nitroglycerin, call 9-1-1 immediately to seek emergency medical care. Do not take more than 3 nitroglycerin tablets over 15 minutes. If you take this medicine often to relieve symptoms of angina, your doctor or health care professional may provide you with different instructions to manage your symptoms. If symptoms do not go away after following these instructions, it is important to call 9-1-1 immediately. Do not take more than 3 nitroglycerin tablets over 15 minutes. Talk to your pediatrician regarding the use of this medicine in children. Special care may be needed. Overdosage: If you think you have taken too much of this medicine contact a poison control center or emergency room at once. NOTE: This medicine is only for you. Do not share this medicine with others. What if I miss a dose? This does not apply. This medicine is only used as needed. What may interact with this medicine? Do not take this medicine with any of the following medications: -certain migraine medicines like ergotamine and dihydroergotamine (DHE) -medicines used to treat erectile dysfunction like sildenafil, tadalafil, and vardenafil -riociguat This medicine may also interact with the following medications: -alteplase -aspirin -heparin -medicines for high blood pressure -medicines for mental depression -other medicines used to treat angina -phenothiazines like chlorpromazine, mesoridazine, prochlorperazine, thioridazine  This list may not describe all possible interactions. Give your health care provider a list of all the medicines, herbs, non-prescription drugs, or dietary  supplements you use. Also tell them if you smoke, drink alcohol, or use illegal drugs. Some items may interact with your medicine. What should I watch for while using this medicine? Tell your doctor or health care professional if you feel your medicine is no longer working. Keep this medicine with you at all times. Sit or lie down when you take your medicine to prevent falling if you feel dizzy or faint after using it. Try to remain calm. This will help you to feel better faster. If you feel dizzy, take several deep breaths and lie down with your feet propped up, or bend forward with your head resting between your knees. You may get drowsy or dizzy. Do not drive, use machinery, or do anything that needs mental alertness until you know how this drug affects you. Do not stand or sit up quickly, especially if you are an older patient. This reduces the risk of dizzy or fainting spells. Alcohol can make you more drowsy and dizzy. Avoid alcoholic drinks. Do not treat yourself for coughs, colds, or pain while you are taking this medicine without asking your doctor or health care professional for advice. Some ingredients may increase your blood pressure. What side effects may I notice from receiving this medicine? Side effects that you should report to your doctor or health care professional as soon as possible: -blurred vision -dry mouth -skin rash -sweating -the feeling of extreme pressure in the head -unusually weak or tired Side effects that usually do not require medical attention (report to your doctor or health care professional if they continue or are bothersome): -flushing of the face or neck -headache -irregular heartbeat, palpitations -nausea, vomiting This list may not describe all possible side effects. Call your doctor for medical advice about side effects. You may report side effects to FDA at 1-800-FDA-1088. Where should I keep my medicine? Keep out of the reach of children. Store at  room temperature between 20 and 25 degrees C (68 and 77 degrees F). Store in Retail buyer. Protect from light and moisture. Keep tightly closed. Throw away any unused medicine after the expiration date. NOTE: This sheet is a summary. It may not cover all possible information. If you have questions about this medicine, talk to your doctor, pharmacist, or health care provider.  2018 Elsevier/Gold Standard (2012-10-25 17:57:36)    If you need a refill on your cardiac medications before your next appointment, please call your pharmacy.

## 2016-08-29 NOTE — Telephone Encounter (Signed)
New message    Thomas Woods, pharmacist from [facility] is calling to ask a question.  Pt c/o medication issue:  1. Name of Medication: nitrostat, Viagra   2. How are you currently taking this medication (dosage and times per day)?   3. Are you having a reaction (difficulty breathing--STAT)?   4. What is your medication issue? Thomas Woods states that medications should not be used together. He needs to know if Thomas Woods is ok with this or what to do. Please call.

## 2016-08-29 NOTE — Telephone Encounter (Signed)
Spoke with Thomas Woods at the [facility] and let him know that Dr. Eldridge Dace is aware that the patient is prescribed both and that the NTG is PRN and the patient has been counseled on not taking the two medications together.

## 2016-09-26 MED FILL — LISINOPRIL 5 MG TAB: 5 | 30 days supply | Qty: 30 | Fill #5

## 2016-09-26 MED FILL — $Viagra 50mg tablet: 50 | 30 days supply | Qty: 10 | Fill #5

## 2016-09-26 MED FILL — ATORVASTATIN 80 MG TABLET: 80 | 30 days supply | Qty: 30 | Fill #5

## 2016-10-04 MED FILL — NITROSTAT 0.4 MG TABLET SL: 0.4 | 8 days supply | Qty: 25 | Fill #0

## 2016-10-04 MED FILL — ?CARVEDILOL 12.5 MG TABLET: 12.5 | 30 days supply | Qty: 60 | Fill #5

## 2016-10-04 MED FILL — ?CLOPIDOGREL 75MG TAB: 75 | 30 days supply | Qty: 30 | Fill #9

## 2016-10-04 MED FILL — !CHANTIX CONT MONTH BOX: 1 | 28 days supply | Qty: 56 | Fill #1

## 2016-10-12 DIAGNOSIS — H401190 Primary open-angle glaucoma, unspecified eye, stage unspecified: Secondary | ICD-10-CM | POA: Insufficient documentation

## 2016-10-12 DIAGNOSIS — H2513 Age-related nuclear cataract, bilateral: Secondary | ICD-10-CM | POA: Insufficient documentation

## 2016-11-02 ENCOUNTER — Other Ambulatory Visit: Payer: Self-pay | Admitting: Internal Medicine

## 2016-11-09 ENCOUNTER — Ambulatory Visit: Payer: Self-pay | Attending: Internal Medicine | Admitting: Internal Medicine

## 2016-11-09 VITALS — BP 136/72 | HR 95 | Temp 97.6°F | Resp 18 | Ht 72.0 in | Wt 204.6 lb

## 2016-11-09 DIAGNOSIS — F4323 Adjustment disorder with mixed anxiety and depressed mood: Secondary | ICD-10-CM | POA: Insufficient documentation

## 2016-11-09 DIAGNOSIS — Z79899 Other long term (current) drug therapy: Secondary | ICD-10-CM | POA: Insufficient documentation

## 2016-11-09 DIAGNOSIS — E785 Hyperlipidemia, unspecified: Secondary | ICD-10-CM | POA: Insufficient documentation

## 2016-11-09 DIAGNOSIS — F17200 Nicotine dependence, unspecified, uncomplicated: Secondary | ICD-10-CM

## 2016-11-09 DIAGNOSIS — Z7982 Long term (current) use of aspirin: Secondary | ICD-10-CM | POA: Insufficient documentation

## 2016-11-09 DIAGNOSIS — I10 Essential (primary) hypertension: Secondary | ICD-10-CM | POA: Insufficient documentation

## 2016-11-09 DIAGNOSIS — F17210 Nicotine dependence, cigarettes, uncomplicated: Secondary | ICD-10-CM | POA: Insufficient documentation

## 2016-11-09 DIAGNOSIS — Z7902 Long term (current) use of antithrombotics/antiplatelets: Secondary | ICD-10-CM | POA: Insufficient documentation

## 2016-11-09 DIAGNOSIS — Z955 Presence of coronary angioplasty implant and graft: Secondary | ICD-10-CM | POA: Insufficient documentation

## 2016-11-09 DIAGNOSIS — I252 Old myocardial infarction: Secondary | ICD-10-CM | POA: Insufficient documentation

## 2016-11-09 DIAGNOSIS — I255 Ischemic cardiomyopathy: Secondary | ICD-10-CM | POA: Insufficient documentation

## 2016-11-09 DIAGNOSIS — R918 Other nonspecific abnormal finding of lung field: Secondary | ICD-10-CM | POA: Insufficient documentation

## 2016-11-09 MED ORDER — TRAZODONE HCL 50 MG PO TABS: 25.0000 mg | ORAL_TABLET | Freq: Every evening | ORAL | 3 refills | Status: DC | PRN

## 2016-11-09 MED ORDER — CARVEDILOL 12.5 MG PO TABS: 12.5000 mg | ORAL_TABLET | Freq: Two times a day (BID) | ORAL | 3 refills | Status: DC

## 2016-11-09 MED ORDER — LISINOPRIL 5 MG PO TABS: 5.0000 mg | ORAL_TABLET | Freq: Every day | ORAL | 3 refills | Status: DC

## 2016-11-09 MED ORDER — CLOPIDOGREL BISULFATE 75 MG PO TABS: 75.0000 mg | ORAL_TABLET | Freq: Every day | ORAL | 3 refills | Status: DC

## 2016-11-09 MED ORDER — ATORVASTATIN CALCIUM 80 MG PO TABS: 80.0000 mg | ORAL_TABLET | Freq: Every day | ORAL | 3 refills | Status: DC

## 2016-11-09 MED ORDER — BUSPIRONE HCL 10 MG PO TABS: 10.0000 mg | ORAL_TABLET | Freq: Two times a day (BID) | ORAL | 3 refills | Status: DC

## 2016-11-09 MED FILL — ?CARVEDILOL 12.5 MG TABLET: 12.5 | 30 days supply | Qty: 60 | Fill #0

## 2016-11-09 MED FILL — $Viagra 50mg tablet: 50 | 30 days supply | Qty: 10 | Fill #0

## 2016-11-09 MED FILL — traZODone HCL 50 MG TABS: 50 | 30 days supply | Qty: 30 | Fill #0

## 2016-11-09 MED FILL — LISINOPRIL 5 MG TAB: 5 | 30 days supply | Qty: 30 | Fill #6

## 2016-11-09 MED FILL — ATORVASTATIN 80 MG TABLET: 80 | 30 days supply | Qty: 30 | Fill #0

## 2016-11-09 MED FILL — busPIRone HCL 10 MG TABS: 10 | 30 days supply | Qty: 60 | Fill #1

## 2016-11-09 MED FILL — ?CLOPIDOGREL 75MG TAB: 75 | 30 days supply | Qty: 30 | Fill #0

## 2016-11-09 MED FILL — !CHANTIX CONT MONTH BOX: 1 | 28 days supply | Qty: 56 | Fill #2

## 2016-11-09 NOTE — Progress Notes (Signed)
Thomas Woods, is a 51 y.o. male  [id number]  [id number]  DOB - 01/18/1965  Chief Complaint  Patient presents with  . Annual Exam       Subjective:   Thomas Woods is a 51 y.o. male with history of NSTEMI>>STEMI w/ DES x 2 to the LAD 03/27/2014, med Rx for mod RCA dz, ICM w/ EF 25-30%, apical thrombus treated with coumadin for a few months andstopped. Rx w/ ASA, Brilinta now changed to Plavix, Coreg and statin, last ECHO on 07/07/2014 with LVEF of 55% presents here today for a follow up visit and medication refills. He has no complaint except for financial hardship. He has only $1.50 at the moment and has ran out of all his medications,. He really wants to be taking his medications and pleading to get them filled such that he can pay later. He has no job but hopefully will start one soon, has done couple of interviews. No fever, no chest pain, no SOB, no PND, no leg edema. Still smoking but down to few cigarette per day. Patient has No headache, No abdominal pain - No Nausea, No new weakness tingling or numbness, No Cough.  No problems updated.  ALLERGIES: No Known Allergies  PAST MEDICAL HISTORY: Past Medical History:  Diagnosis Date  . Hyperlipidemia LDL goal <70 03/27/2014  . Hypertension   . Ischemic cardiomyopathy 03/27/2014   EF 30% at cath, confirmed by echo  . Multiple lung nodules on CT 03/28/2014   Right lung nodules up to 6 mm, repeat CT in 6-12 months  . 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 NSTEMI (non-ST elevated myocardial infarction) ([place]) 03/26/2014   Patient pain increased and he developed ECG changes, taken to the cath lab as a STEMI, PCI to the LAD    MEDICATIONS AT HOME: Prior to Admission medications   Medication Sig Start Date End Date Taking? Authorizing Provider  aspirin 81 MG tablet Take 1 tablet (81 mg total) by mouth daily. 03/05/15   Tresa Garter, MD  atorvastatin (LIPITOR) 80 MG tablet Take 1 tablet  (80 mg total) by mouth daily. 11/09/16   Tresa Garter, MD  busPIRone (BUSPAR) 10 MG tablet Take 1 tablet (10 mg total) by mouth 2 (two) times daily. 11/09/16   Tresa Garter, MD  carvedilol (COREG) 12.5 MG tablet Take 1 tablet (12.5 mg total) by mouth 2 (two) times daily with a meal. 11/09/16   Tresa Garter, MD  clopidogrel (PLAVIX) 75 MG tablet Take 1 tablet (75 mg total) by mouth daily. 11/09/16   Tresa Garter, MD  lisinopril (PRINIVIL,ZESTRIL) 5 MG tablet Take 1 tablet (5 mg total) by mouth daily. 11/09/16   Tresa Garter, MD  methocarbamol (ROBAXIN) 500 MG tablet Take 1 tablet (500 mg total) by mouth 4 (four) times daily. 08/03/16   Tresa Garter, MD  nitroGLYCERIN (NITROSTAT) 0.4 MG SL tablet Place 1 tablet (0.4 mg total) under the tongue every 5 (five) minutes as needed for chest pain (x 3 doses). 08/29/16 11/27/16  Jettie Booze, MD  traZODone (DESYREL) 50 MG tablet Take 0.5-1 tablets (25-50 mg total) by mouth at bedtime as needed for sleep. 11/09/16   Tresa Garter, MD  varenicline (CHANTIX CONTINUING MONTH PAK) 1 MG tablet Take 1 tablet (1 mg total) by mouth 2 (two) times daily. 08/03/16   Tresa Garter, MD  varenicline (CHANTIX) 0.5 MG tablet Take 1 tablet (  0.5 mg total) by mouth 2 (two) times daily. 08/03/16   Tresa Garter, MD  VIAGRA 50 MG tablet TAKE 1 TABLET BY MOUTH DAILY AS NEEDED FOR ERECTILE DYSFUNCTION 11/02/16   Tresa Garter, MD    Objective:   Vitals:   11/09/16 0955  BP: 136/72  Pulse: 95  Resp: 18  Temp: 97.6 F (36.4 C)  TempSrc: Oral  SpO2: 100%  Weight: 204 lb 9.6 oz (92.8 kg)  Height: 6' (1.829 m)   Exam General appearance : Awake, alert, not in any distress. Speech Clear. Not toxic looking HEENT: Atraumatic and Normocephalic, pupils equally reactive to light and accomodation Neck: Supple, no JVD. No cervical lymphadenopathy.  Chest: Good air entry bilaterally, no added sounds    CVS: S1 S2 regular, no murmurs.  Abdomen: Bowel sounds present, Non tender and not distended with no gaurding, rigidity or rebound. Extremities: B/L Lower Ext shows no edema, both legs are warm to touch, finger clubbing bilaterally (stage 4) Neurology: Awake alert, and oriented X 3, CN II-XII intact, Non focal Skin: No Rash  Data Review No results found for: HGBA1C  Assessment & Plan   1. Cardiomyopathy, ischemic Refill - atorvastatin (LIPITOR) 80 MG tablet; Take 1 tablet (80 mg total) by mouth daily.  Dispense: 90 tablet; Refill: 3 - carvedilol (COREG) 12.5 MG tablet; Take 1 tablet (12.5 mg total) by mouth 2 (two) times daily with a meal.  Dispense: 180 tablet; Refill: 3 - clopidogrel (PLAVIX) 75 MG tablet; Take 1 tablet (75 mg total) by mouth daily.  Dispense: 90 tablet; Refill: 3 - lisinopril (PRINIVIL,ZESTRIL) 5 MG tablet; Take 1 tablet (5 mg total) by mouth daily.  Dispense: 90 tablet; Refill: 3 - CMP14+EGFR - Lipid panel - POCT glycosylated hemoglobin (Hb A1C)  2. Adjustment disorder with mixed anxiety and depressed mood Refill - busPIRone (BUSPAR) 10 MG tablet; Take 1 tablet (10 mg total) by mouth 2 (two) times daily.  Dispense: 60 tablet; Refill: 3 - traZODone (DESYREL) 50 MG tablet; Take 0.5-1 tablets (25-50 mg total) by mouth at bedtime as needed for sleep.  Dispense: 30 tablet; Refill: 3  3. Smoking addiction Thomas Woods was counseled on the dangers of tobacco use, and was advised to quit. Reviewed strategies to maximize success, including removing cigarettes and smoking materials from environment, stress management and support of family/friends.  Patient have been counseled extensively about nutrition and exercise. Other issues discussed during this visit include: low cholesterol diet, weight control and daily exercise, importance of adherence with medications and regular follow-up. We also discussed long term complications of uncontrolled hypertension.   Return in about 6 months  (around 05/09/2017) for Heart Failure and Hypertension.  The patient was given clear instructions to go to ER or return to medical center if symptoms don't improve, worsen or new problems develop. The patient verbalized understanding. The patient was told to call to get lab results if they haven't heard anything in the next week.   This note has been created with Surveyor, quantity. Any transcriptional errors are unintentional.    Angelica Chessman, MD, [place], [place], [place], [place] and [place], [place]   11/09/2016, 11:00 AM

## 2016-11-09 NOTE — Patient Instructions (Signed)
Major Depressive Disorder, Adult Major depressive disorder (MDD) is a mental health condition. MDD often makes you feel sad, hopeless, or helpless. MDD can also cause symptoms in your body. MDD can affect your:  Work.  School.  Relationships.  Other normal activities.  MDD can range from mild to very bad. It may occur once (single episode MDD). It can also occur many times (recurrent MDD). The main symptoms of MDD often include:  Feeling sad, depressed, or irritable most of the time.  Loss of interest.  MDD symptoms also include:  Sleeping too much or too little.  Eating too much or too little.  A change in your weight.  Feeling tired (fatigue) or having low energy.  Feeling worthless.  Feeling guilty.  Trouble making decisions.  Trouble thinking clearly.  Thoughts of suicide or harming others.  Feeling weak.  Feeling agitated.  Keeping yourself from being around other people (isolation).  Follow these instructions at home: Activity  Do these things as told by your doctor: ? Go back to your normal activities. ? Exercise regularly. ? Spend time outdoors. Alcohol  Talk with your doctor about how alcohol can affect your antidepressant medicines.  Do not drink alcohol. Or, limit how much alcohol you drink. ? This means no more than 1 drink a day for nonpregnant women and 2 drinks a day for men. One drink equals one of these:  12 oz of beer.  5 oz of wine.  1 oz of hard liquor. General instructions  Take over-the-counter and prescription medicines only as told by your doctor.  Eat a healthy diet.  Get plenty of sleep.  Find activities that you enjoy. Make time to do them.  Think about joining a support group. Your doctor may be able to suggest a group for you.  Keep all follow-up visits as told by your doctor. This is important. Where to find more information:  The [employer] on Mental Illness: ? www.nami.org  [place] [employer] of  Mental Health: ? http://www.maynard.net/  National Suicide Prevention Lifeline: ? (949) 384-0136. This is free, 24-hour help. Contact a doctor if:  Your symptoms get worse.  You have new symptoms. Get help right away if:  You self-harm.  You see, hear, taste, smell, or feel things that are not present (hallucinate). If you ever feel like you may hurt yourself or others, or have thoughts about taking your own life, get help right away. You can go to your nearest emergency department or call:  Your local emergency services (911 in the [place]  A suicide crisis helpline, such as the National Suicide Prevention Lifeline: ? 213-344-5259. This is open 24 hours a day.  This information is not intended to replace advice given to you by your health care provider. Make sure you discuss any questions you have with your health care provider. Document Released: 12/08/2014 Document Revised: 09/13/2015 Document Reviewed: 09/13/2015 Elsevier Interactive Patient Education  2017 [employer]. Steps to Quit Smoking Smoking tobacco can be harmful to your health and can affect almost every organ in your body. Smoking puts you, and those around you, at risk for developing many serious chronic diseases. Quitting smoking is difficult, but it is one of the best things that you can do for your health. It is never too late to quit. What are the benefits of quitting smoking? When you quit smoking, you lower your risk of developing serious diseases and conditions, such as:  Lung cancer or lung disease, such as COPD.  Heart disease.  Stroke.  Heart attack.  Infertility.  Osteoporosis and bone fractures.  Additionally, symptoms such as coughing, wheezing, and shortness of breath may get better when you quit. You may also find that you get sick less often because your body is stronger at fighting off colds and infections. If you are pregnant, quitting smoking can help to reduce your chances of having a baby of  low birth weight. How do I get ready to quit? When you decide to quit smoking, create a plan to make sure that you are successful. Before you quit:  Pick a date to quit. Set a date within the next two weeks to give you time to prepare.  Write down the reasons why you are quitting. Keep this list in places where you will see it often, such as on your bathroom mirror or in your car or wallet.  Identify the people, places, things, and activities that make you want to smoke (triggers) and avoid them. Make sure to take these actions: ? Throw away all cigarettes at home, at work, and in your car. ? Throw away smoking accessories, such as Set designer. ? Clean your car and make sure to empty the ashtray. ? Clean your home, including curtains and carpets.  Tell your family, friends, and coworkers that you are quitting. Support from your loved ones can make quitting easier.  Talk with your health care provider about your options for quitting smoking.  Find out what treatment options are covered by your health insurance.  What strategies can I use to quit smoking? Talk with your healthcare provider about different strategies to quit smoking. Some strategies include:  Quitting smoking altogether instead of gradually lessening how much you smoke over a period of time. Research shows that quitting "cold [place]" is more successful than gradually quitting.  Attending in-person counseling to help you build problem-solving skills. You are more likely to have success in quitting if you attend several counseling sessions. Even short sessions of 10 minutes can be effective.  Finding resources and support systems that can help you to quit smoking and remain smoke-free after you quit. These resources are most helpful when you use them often. They can include: ? Online chats with a Veterinary surgeon. ? Telephone quitlines. ? Automotive engineer. ? Support groups or group counseling. ? Text  messaging programs. ? Mobile phone applications.  Taking medicines to help you quit smoking. (If you are pregnant or breastfeeding, talk with your health care provider first.) Some medicines contain nicotine and some do not. Both types of medicines help with cravings, but the medicines that include nicotine help to relieve withdrawal symptoms. Your health care provider may recommend: ? Nicotine patches, gum, or lozenges. ? Nicotine inhalers or sprays. ? Non-nicotine medicine that is taken by mouth.  Talk with your health care provider about combining strategies, such as taking medicines while you are also receiving in-person counseling. Using these two strategies together makes you more likely to succeed in quitting than if you used either strategy on its own. If you are pregnant or breastfeeding, talk with your health care provider about finding counseling or other support strategies to quit smoking. Do not take medicine to help you quit smoking unless told to do so by your health care provider. What things can I do to make it easier to quit? Quitting smoking might feel overwhelming at first, but there is a lot that you can do to make it easier. Take these important actions:  Reach out to  your family and friends and ask that they support and encourage you during this time. Call telephone quitlines, reach out to support groups, or work with a counselor for support.  Ask people who smoke to avoid smoking around you.  Avoid places that trigger you to smoke, such as bars, parties, or smoke-break areas at work.  Spend time around people who do not smoke.  Lessen stress in your life, because stress can be a smoking trigger for some people. To lessen stress, try: ? Exercising regularly. ? Deep-breathing exercises. ? Yoga. ? Meditating. ? Performing a body scan. This involves closing your eyes, scanning your body from head to toe, and noticing which parts of your body are particularly tense.  Purposefully relax the muscles in those areas.  Download or purchase mobile phone or tablet apps (applications) that can help you stick to your quit plan by providing reminders, tips, and encouragement. There are many free apps, such as QuitGuide from the [employer] developer for Disease Control and Prevention). You can find other support for quitting smoking (smoking cessation) through smokefree.gov and other websites.  How will I feel when I quit smoking? Within the first 24 hours of quitting smoking, you may start to feel some withdrawal symptoms. These symptoms are usually most noticeable 2-3 days after quitting, but they usually do not last beyond 2-3 weeks. Changes or symptoms that you might experience include:  Mood swings.  Restlessness, anxiety, or irritation.  Difficulty concentrating.  Dizziness.  Strong cravings for sugary foods in addition to nicotine.  Mild weight gain.  Constipation.  Nausea.  Coughing or a sore throat.  Changes in how your medicines work in your body.  A depressed mood.  Difficulty sleeping (insomnia).  After the first 2-3 weeks of quitting, you may start to notice more positive results, such as:  Improved sense of smell and taste.  Decreased coughing and sore throat.  Slower heart rate.  Lower blood pressure.  Clearer skin.  The ability to breathe more easily.  Fewer sick days.  Quitting smoking is very challenging for most people. Do not get discouraged if you are not successful the first time. Some people need to make many attempts to quit before they achieve long-term success. Do your best to stick to your quit plan, and talk with your health care provider if you have any questions or concerns. This information is not intended to replace advice given to you by your health care provider. Make sure you discuss any questions you have with your health care provider. Document Released: 12/21/2000 Document Revised: 08/25/2015 Document Reviewed:  05/13/2014 Elsevier Interactive Patient Education  2017 Elsevier Inc. Cardiomyopathy, Adult Cardiomyopathy is a long-term (chronic) disease of the heart muscle. The disease makes the heart muscle thick, weak, or stiff. As a result, the heart works harder to pump blood. Over time, cardiomyopathy can lead to heart failure. There are several types of cardiomyopathy:  Dilated cardiomyopathy. This type causes the ventricles to become weak and stretched out.  Hypertrophic cardiomyopathy. This type causes the heart muscle to thicken.  Restrictive cardiomyopathy. This type causes the heart muscle to become stiff.  Ischemic cardiomyopathy. This type involves narrowing arteries that cause the walls of the heart to get thinner.  Peripartum cardiomyopathy. This type occurs during pregnancy or shortly after pregnancy.  What are the causes? This condition may be caused by:  A gene that is passed down (inherited) from a family member.  A medical condition that damages the heart, such as: ?  Diabetes. ? High blood pressure. ? Viral infection of the heart. ? Heart attack. ? Coronary heart disease.  Alcoholism.  Using illegal drugs or some prescription medicines.  Pregnancy.  Your body absorbing and storing too much iron (hemochromatosis).  Autoimmune diseases, connective tissue diseases, endocrine diseases, and muscle diseases.  Cancer treatments.  Buildup of proteins in your organs (amyloidosis), or inflammation in your organs (sarcoidosis).  Often, the cause is not known. What increases the risk? This condition is more likely to develop in people who:  Have a family history of cardiomyopathy or other heart problems.  Are overweight or obese.  Use illegal drugs.  Abuse alcohol.  Have a medical condition that damages the heart.  What are the signs or symptoms? Symptoms of this condition include:  Shortness of breath, especially during activity.  Fatigue.  An irregular  heartbeat and heart murmurs.  Dizziness.  Light-headedness.  Fainting.  Chest pain.  Coughing.  Swelling in the lower legs, ankles, feet, abdomen, and neck veins.  Often, people with this condition have no symptoms. How is this diagnosed? This condition is diagnosed based on:  Your symptoms and medical history.  A physical exam.  Tests.  Tests may include:  Blood tests.  Imaging studies of your heart, such as: ? X-rays. ? An echocardiogram. ? An MRI.  An electrocardiogram (ECG). This records your heart's electrical activity.  A test in which you wear a portable device (event monitor) to record your heart's electrical activity while you go about your day.  A stress test. This monitors your heart's activity while exercising.  Cardiac catheterization. This procedure checks the blood pressure and blood flow in your heart.  An angiogram. This is an injection of dye into your arteries before imaging studies are taken.  Heart tissue biopsy. This removes a sample of heart tissue for examination.  How is this treated?  Treatment for this condition depends on the type of cardiomyopathy you have and the severity of your symptoms. If you do not have symptoms, you may not need treatment. If you need treatment, it may include:  Lifestyle changes, such as: ? Eating a heart-healthy diet that includes plenty of fruits, vegetables, and whole grains, and cutting down on salt (sodium). ? Maintaining a healthy weight, and losing weight, if needed. ? Getting regular exercise. ? Quitting smoking, if you smoke. ? Avoiding alcohol. ? Medicine to:  Lower your blood pressure.  Slow down your heart rate.  Keep your heart beating in a steady rhythm.  Clear excess fluids from your body.  Prevent blood clots.  Balance minerals (electrolytes) in your body and get rid of extra sodium in your body.  Reduce inflammation.  Strengthen your heartbeat. ? Surgery to:  Repair a  defect.  Remove thickened tissue.  Destroy tissues in the area of abnormal electrical activity (ablation).  Implant a device to treat serious heart rhythm problems (implantable cardioverter-defibrillator, or ICD), or a pacemaker.  Replace your heart (heart transplant) if all other treatments have failed (end stage).  Other treatments may include cardiac resynchronization therapy (CRT) or a left ventricular assist device (LVAD). Follow these instructions at home: Lifestyle  Eat a heart-healthy diet. Work with your health care provider or a registered dietitian to learn about healthy eating options.  Maintain a healthy weight.  Stay physically active. Ask your health care provider to suggest some activities that are good for you.  Do not use any products that contain nicotine or tobacco, such as cigarettes and e-cigarettes.  If you need help quitting, ask your health care provider.  Limit alcohol intake to no more than one drink per day for nonpregnant women and no more than two drinks per day for men. One drink equals 12 oz of beer, 5 oz of wine, or 1 oz of hard liquor.  Try to get at least 7 hours of sleep each night.  Find healthy ways to manage stress. General instructions  Take over-the-counter and prescription medicines only as told by your health care provider. Some medicines can be dangerous for your heart.  Tell all health care providers, including your dentist, that you have cardiomyopathy. When you visit the dentist or have surgery, ask your health care provider if you need antibiotics before having dental care or before the surgery.  Ask your health care provider if you should wear a medical identification bracelet. This may be important if you have a pacemaker or a defibrillator.  Make sure you get all recommended vaccinations and an annual flu shot.  Work closely with your health care provider to manage any long-lasting (chronic) conditions.  Keep all follow-up  visits as told by your health care provider. This is important, even if you do not have any symptoms. Your health care provider may need to make sure your condition is not getting worse. How is this prevented? This condition cannot be prevented. Parents, siblings, and children of people with this condition may be at risk for the condition. It is a good idea for them to get screened for the condition because it is best when cardiomyopathy is found early. Screening is done with an ECG and echocardiogram. People who want to start a family may also want to meet with a genetic counselor to discuss the risk of having a child with cardiomyopathy. Contact a health care provider if:  Your symptoms get worse.  You have new symptoms. Get help right away if:  You have severe chest pain.  You have shortness of breath.  You cough up pink, bubbly material.  You have sudden sweating.  You feel nauseous and you vomit.  You suddenly become light-headed or dizzy.  You feel your heart beating very quickly.  It feels like your heart is skipping beats. These symptoms may represent a serious problem that is an emergency. Do not wait to see if the symptoms will go away. Get medical help right away. Call your local emergency services (911 in the [place] Do not drive yourself to the hospital. This information is not intended to replace advice given to you by your health care provider. Make sure you discuss any questions you have with your health care provider. Document Released: 03/11/2004 Document Revised: 08/25/2015 Document Reviewed: 06/29/2015 Elsevier Interactive Patient Education  [employer].

## 2016-11-10 LAB — CMP14+EGFR
A/G RATIO: 1.8 (ref 1.2–2.2)
ALT: 11 IU/L (ref 0–44)
AST: 21 IU/L (ref 0–40)
Albumin: 5.1 g/dL (ref 3.5–5.5)
Alkaline Phosphatase: 49 IU/L (ref 39–117)
BILIRUBIN TOTAL: 1.6 mg/dL — AB (ref 0.0–1.2)
BUN/Creatinine Ratio: 14 (ref 9–20)
BUN: 11 mg/dL (ref 6–24)
CHLORIDE: 99 mmol/L (ref 96–106)
CO2: 25 mmol/L (ref 20–29)
Calcium: 10.1 mg/dL (ref 8.7–10.2)
Creatinine, Ser: 0.8 mg/dL (ref 0.76–1.27)
GFR calc non Af Amer: 104 mL/min/{1.73_m2} (ref >59)
GFR, EST AFRICAN AMERICAN: 120 mL/min/{1.73_m2} (ref >59)
Globulin, Total: 2.8 g/dL (ref 1.5–4.5)
Glucose: 72 mg/dL (ref 65–99)
POTASSIUM: 4 mmol/L (ref 3.5–5.2)
Sodium: 141 mmol/L (ref 134–144)
TOTAL PROTEIN: 7.9 g/dL (ref 6.0–8.5)

## 2016-11-10 LAB — LIPID PANEL
Chol/HDL Ratio: 2.2 ratio (ref 0.0–5.0)
Cholesterol, Total: 184 mg/dL (ref 100–199)
HDL: 82 mg/dL (ref >39)
LDL Calculated: 91 mg/dL (ref 0–99)
Triglycerides: 57 mg/dL (ref 0–149)
VLDL Cholesterol Cal: 11 mg/dL (ref 5–40)

## 2016-12-28 MED FILL — !CHANTIX CONT MONTH BOX: 1 | 28 days supply | Qty: 56 | Fill #3

## 2016-12-28 MED FILL — ATORVASTATIN 80 MG TABLET: 80 | 30 days supply | Qty: 30 | Fill #1

## 2016-12-28 MED FILL — ?LISINOPRIL 5 MG TABLET: 5 | 30 days supply | Qty: 30 | Fill #0

## 2016-12-28 MED FILL — busPIRone HCL 10 MG TABS: 10 | 30 days supply | Qty: 60 | Fill #2

## 2016-12-28 MED FILL — ?CLOPIDOGREL 75MG TAB: 75 | 30 days supply | Qty: 30 | Fill #1

## 2016-12-28 MED FILL — !VIAGRA 50 MG TABLET: 50 | 30 days supply | Qty: 10 | Fill #1

## 2016-12-28 MED FILL — ?CARVEDILOL 12.5 MG TABLET: 12.5 | 30 days supply | Qty: 60 | Fill #1

## 2017-01-31 ENCOUNTER — Encounter: Payer: Self-pay | Admitting: Interventional Cardiology

## 2017-01-31 ENCOUNTER — Ambulatory Visit (INDEPENDENT_AMBULATORY_CARE_PROVIDER_SITE_OTHER): Payer: Self-pay | Admitting: Interventional Cardiology

## 2017-01-31 VITALS — BP 128/74 | HR 73 | Ht 72.0 in | Wt 206.0 lb

## 2017-01-31 DIAGNOSIS — E782 Mixed hyperlipidemia: Secondary | ICD-10-CM

## 2017-01-31 DIAGNOSIS — I10 Essential (primary) hypertension: Secondary | ICD-10-CM

## 2017-01-31 DIAGNOSIS — Z720 Tobacco use: Secondary | ICD-10-CM

## 2017-01-31 DIAGNOSIS — I255 Ischemic cardiomyopathy: Secondary | ICD-10-CM

## 2017-01-31 DIAGNOSIS — R200 Anesthesia of skin: Secondary | ICD-10-CM

## 2017-01-31 DIAGNOSIS — I25119 Atherosclerotic heart disease of native coronary artery with unspecified angina pectoris: Secondary | ICD-10-CM

## 2017-01-31 MED ORDER — CLOPIDOGREL BISULFATE 75 MG PO TABS: 75.0000 mg | ORAL_TABLET | Freq: Every day | ORAL | 3 refills | Status: DC

## 2017-01-31 MED ORDER — ATORVASTATIN CALCIUM 80 MG PO TABS: 80.0000 mg | ORAL_TABLET | Freq: Every day | ORAL | 3 refills | Status: DC

## 2017-01-31 NOTE — Progress Notes (Signed)
Cardiology Office Note   Date:  01/31/2017   ID:  Thomas Woods, DOB [birth year], MRN [id number]  PCP:  Quentin AngstJegede, Olugbemiga E, MD    No chief complaint on file. CAD   Wt Readings from Last 3 Encounters:  01/31/17 206 lb (93.4 kg)  11/09/16 204 lb 9.6 oz (92.8 kg)  08/29/16 206 lb 6.4 oz (93.6 kg)       History of Present Illness: Thomas Woods is a 52 y.o. male  Who had an anterior MI in 3/16. Angina was a burning sensation.He had 2 long overlapping drug-eluting stents to the LAD. His EF was low at 30%. He had an apical thrombus, and was treated with warfarin for a few months. He has done well. His most recent EF is now low normal.  He has smoked cigarettes and tried thepatch to stop smoking.  THis was unsuccessful.  He did go back to Chantix and he has cut back significantly.    He continues to have some financial issues.  He is working part time.  His weight is stable.  He eats healthier.   Denies :  Dizziness. Leg edema.  Orthopnea. Palpitations. Paroxysmal nocturnal dyspnea. Shortness of breath. Syncope.   One episode of chest discomfort that was relieved with NTG.    Mild left arm numbness and left leg numbness.  Intermittent.  Worse after walking.  Easy bruising.  No bleeding problems.   Past Medical History:  Diagnosis Date  . Hyperlipidemia LDL goal <70 03/27/2014  . Hypertension   . Ischemic cardiomyopathy 03/27/2014   EF 30% at cath, confirmed by echo  . Multiple lung nodules on CT 03/28/2014   Right lung nodules up to 6 mm, repeat CT in 6-12 months  . 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 NSTEMI (non-ST elevated myocardial infarction) (HCC) 03/26/2014   Patient pain increased and he developed ECG changes, taken to the cath lab as a STEMI, PCI to the LAD    Past Surgical History:  Procedure Laterality Date  . HERNIA REPAIR    . LEFT HEART CATHETERIZATION WITH CORONARY ANGIOGRAM N/A 03/27/2014   Procedure: LEFT  HEART CATHETERIZATION WITH CORONARY ANGIOGRAM;  Surgeon: Corky CraftsJayadeep S Rhylei Mcquaig, MD; L main okay, LAD 100%, CFX okay, RI moderate disease, OM1 mild disease, RCA moderate disease, EF 30%  . PERCUTANEOUS CORONARY STENT INTERVENTION (PCI-S)  03/27/2014   3.0 x 32 Synergy overlapped with 2.5 x 38 Synergy to the mLAD     Current Outpatient Medications  Medication Sig Dispense Refill  . aspirin 81 MG tablet Take 1 tablet (81 mg total) by mouth daily. 30 tablet 3  . atorvastatin (LIPITOR) 80 MG tablet Take 1 tablet (80 mg total) by mouth daily. 90 tablet 3  . busPIRone (BUSPAR) 10 MG tablet Take 1 tablet (10 mg total) by mouth 2 (two) times daily. 60 tablet 3  . carvedilol (COREG) 12.5 MG tablet Take 1 tablet (12.5 mg total) by mouth 2 (two) times daily with a meal. 180 tablet 3  . clopidogrel (PLAVIX) 75 MG tablet Take 1 tablet (75 mg total) by mouth daily. 90 tablet 3  . lisinopril (PRINIVIL,ZESTRIL) 5 MG tablet Take 1 tablet (5 mg total) by mouth daily. 90 tablet 3  . methocarbamol (ROBAXIN) 500 MG tablet Take 1 tablet (500 mg total) by mouth 4 (four) times daily. 60 tablet 0  . traZODone (DESYREL) 50 MG tablet Take 0.5-1 tablets (25-50 mg total) by mouth  at bedtime as needed for sleep. 30 tablet 3  . varenicline (CHANTIX CONTINUING MONTH PAK) 1 MG tablet Take 1 tablet (1 mg total) by mouth 2 (two) times daily. 60 tablet 3  . varenicline (CHANTIX) 0.5 MG tablet Take 1 tablet (0.5 mg total) by mouth 2 (two) times daily. 14 tablet 0  . VIAGRA 50 MG tablet TAKE 1 TABLET BY MOUTH DAILY AS NEEDED FOR ERECTILE DYSFUNCTION 30 tablet 3  . nitroGLYCERIN (NITROSTAT) 0.4 MG SL tablet Place 1 tablet (0.4 mg total) under the tongue every 5 (five) minutes as needed for chest pain (x 3 doses). 25 tablet 3   No current facility-administered medications for this visit.     Allergies:   Patient has no known allergies.    Social History:  The patient  reports that he has been smoking cigarettes.  He has been  smoking about 0.20 packs per day. he has never used smokeless tobacco. He reports that he drinks alcohol. He reports that he does not use drugs.   Family History:  The patient's family history includes Healthy in his brother, mother, and sister.    ROS:  Please see the history of present illness.   Otherwise, review of systems are positive for leg numbness, transient, after working for a few hours.   All other systems are reviewed and negative.    PHYSICAL EXAM: VS:  BP 128/74   Pulse 73   Ht 6' (1.829 m)   Wt 206 lb (93.4 kg)   SpO2 99%   BMI 27.94 kg/m  , BMI Body mass index is 27.94 kg/m. GEN: Well nourished, well developed, in no acute distress  HEENT: normal  Neck: no JVD, carotid bruits, or masses Cardiac: RRR; no murmurs, rubs, or gallops,no edema ; 2+ radial pulses bilaterally; 2+ left PT pulse Respiratory:  clear to auscultation bilaterally, normal work of breathing GI: soft, nontender, nondistended, + BS MS: no deformity or atrophy  Skin: warm and dry, no Woods Neuro:  Strength and sensation are intact Psych: euthymic mood, full affect   EKG:   The ekg ordered in 8/18 demonstrates NSR, nonspecific ST changes   Recent Labs: 11/09/2016: ALT 11; BUN 11; Creatinine, Ser 0.80; Potassium 4.0; Sodium 141   Lipid Panel    Component Value Date/Time   CHOL 184 11/09/2016 1109   TRIG 57 11/09/2016 1109   HDL 82 11/09/2016 1109   CHOLHDL 2.2 11/09/2016 1109   CHOLHDL 2.1 12/07/2015 1053   VLDL 8 12/07/2015 1053   LDLCALC 91 11/09/2016 1109     Other studies Reviewed: Additional studies/ records that were reviewed today with results demonstrating: lipids reviewed.  EF 50-55% in 2016.   ASSESSMENT AND PLAN:  1. CAD/MI: No angina on medical therapy except for the one episode. COntinue aggressive secondary prevention. 2. Hyperlipidemia: LDL 91. Continue current meds.  Ischemic cardiomyopathy improved with most recent EF being 50-55%. 3. HTN: Controlled. Continue  current meds.  4. Tobacco abuse: He has cut back, but has not stopped completely.  He still takes Chantix.  THis has helped him cut back. 5. Preoperative: he will need a colonoscopy soon.  OK to hold antiplatelet therapy for this.  6. Numbness: blood flow to arm and leg appear normal.  Occurs with repeated activity.  May be neurologic in origin.  He will f/u with PMD.     Current medicines are reviewed at length with the patient today.  The patient concerns regarding his medicines were addressed.  The  following changes have been made:  No change  Labs/ tests ordered today include:  No orders of the defined types were placed in this encounter.   Recommend 150 minutes/week of aerobic exercise Low fat, low carb, high fiber diet recommended  Disposition:   FU in 1 year   Signed, Lance Muss, MD  01/31/2017 3:46 PM    [facility] [address] [place], [place], [place]  [address] Phone: [phone]; Fax: [phone]

## 2017-01-31 NOTE — Patient Instructions (Signed)
Medication Instructions:  Your physician recommends that you continue on your current medications as directed. Please refer to the Current Medication list given to you today.   Labwork: Your physician recommends that you return for a FASTING lipid profile and complete metabolic panel in October 2019   Testing/Procedures: None ordered  Follow-Up: Your physician wants you to follow-up in: 1 year with Dr. Eldridge DaceVaranasi. You will receive a reminder letter in the mail two months in advance. If you don't receive a letter, please call our office to schedule the follow-up appointment.   Any Other Special Instructions Will Be Listed Below (If Applicable).     If you need a refill on your cardiac medications before your next appointment, please call your pharmacy.

## 2017-02-06 ENCOUNTER — Other Ambulatory Visit: Payer: Self-pay | Admitting: Internal Medicine

## 2017-02-06 DIAGNOSIS — F17200 Nicotine dependence, unspecified, uncomplicated: Secondary | ICD-10-CM

## 2017-02-22 MED FILL — ?CARVEDILOL 12.5 MG TABLET: 12.5 | 30 days supply | Qty: 60 | Fill #2

## 2017-02-22 MED FILL — ATORVASTATIN 80 MG TABLET: 80 | 30 days supply | Qty: 30 | Fill #2

## 2017-02-22 MED FILL — !CHANTIX CONT MONTH BOX: 1 | 28 days supply | Qty: 56 | Fill #0

## 2017-02-22 MED FILL — !VIAGRA 50 MG TABLET: 50 | 5 days supply | Qty: 2 | Fill #2

## 2017-02-22 MED FILL — ?CLOPIDOGREL 75MG TABLET: 75 | 30 days supply | Qty: 30 | Fill #2

## 2017-02-22 MED FILL — LISINOPRIL 5 MG TABLET: 5 | 30 days supply | Qty: 30 | Fill #1

## 2017-02-22 MED FILL — busPIRone HCL 10 MG TABS: 10 | 30 days supply | Qty: 60 | Fill #3

## 2017-03-07 ENCOUNTER — Ambulatory Visit: Payer: Self-pay | Attending: Internal Medicine

## 2017-03-14 ENCOUNTER — Ambulatory Visit: Payer: Self-pay

## 2017-03-16 MED FILL — !VIAGRA 50 MG TABLET: 50 | 30 days supply | Qty: 10 | Fill #3

## 2017-03-16 MED FILL — $CHANTIX 1 MG CONT MONTH BO: 1 | 28 days supply | Qty: 56 | Fill #1

## 2017-03-28 ENCOUNTER — Ambulatory Visit: Payer: Self-pay | Attending: Internal Medicine

## 2017-04-19 MED FILL — LISINOPRIL 5 MG TAB: 5 | 30 days supply | Qty: 30 | Fill #2

## 2017-04-19 MED FILL — ATORVASTATIN 80 MG TABLET: 80 | 30 days supply | Qty: 30 | Fill #3

## 2017-04-19 MED FILL — CARVEDILOL 12.5 MG TABLET: 12.5 | 30 days supply | Qty: 60 | Fill #3

## 2017-04-19 MED FILL — CLOPIDOGREL 75 MG TABLET: 75 | 30 days supply | Qty: 30 | Fill #3

## 2017-10-02 MED FILL — CLOPIDOGREL 75 MG TABLET: 75 | 30 days supply | Qty: 30 | Fill #0

## 2017-10-02 MED FILL — ATORVASTATIN 80 MG TABLET: 80 | 30 days supply | Qty: 30 | Fill #0

## 2017-10-02 MED FILL — LISINOPRIL 5 MG TAB: 5 | 30 days supply | Qty: 30 | Fill #3

## 2017-10-02 MED FILL — CARVEDILOL 12.5 MG TABLET: 12.5 | 30 days supply | Qty: 60 | Fill #0

## 2017-10-02 MED FILL — $Viagra 50mg tablet: 50 | 30 days supply | Qty: 10 | Fill #4

## 2017-11-07 ENCOUNTER — Other Ambulatory Visit: Payer: Self-pay | Admitting: Internal Medicine

## 2017-12-26 ENCOUNTER — Other Ambulatory Visit: Payer: Self-pay | Admitting: Internal Medicine

## 2017-12-26 DIAGNOSIS — I255 Ischemic cardiomyopathy: Secondary | ICD-10-CM

## 2018-01-22 ENCOUNTER — Other Ambulatory Visit: Payer: Self-pay | Admitting: Internal Medicine

## 2018-01-22 DIAGNOSIS — I255 Ischemic cardiomyopathy: Secondary | ICD-10-CM

## 2018-01-22 MED FILL — CLOPIDOGREL 75 MG TABLET: 75 | 30 days supply | Qty: 30 | Fill #1

## 2018-01-22 MED FILL — ATORVASTATIN 80 MG TABLET: 80 | 30 days supply | Qty: 30 | Fill #1

## 2018-01-25 ENCOUNTER — Ambulatory Visit: Payer: BLUE CROSS/BLUE SHIELD | Attending: Family Medicine | Admitting: Family Medicine

## 2018-01-25 ENCOUNTER — Encounter: Payer: Self-pay | Admitting: Family Medicine

## 2018-01-25 VITALS — BP 157/83 | HR 92 | Temp 98.1°F | Ht 72.0 in | Wt 211.2 lb

## 2018-01-25 DIAGNOSIS — I255 Ischemic cardiomyopathy: Secondary | ICD-10-CM | POA: Diagnosis not present

## 2018-01-25 DIAGNOSIS — F4323 Adjustment disorder with mixed anxiety and depressed mood: Secondary | ICD-10-CM

## 2018-01-25 DIAGNOSIS — I10 Essential (primary) hypertension: Secondary | ICD-10-CM | POA: Diagnosis not present

## 2018-01-25 DIAGNOSIS — E782 Mixed hyperlipidemia: Secondary | ICD-10-CM

## 2018-01-25 DIAGNOSIS — Z1159 Encounter for screening for other viral diseases: Secondary | ICD-10-CM

## 2018-01-25 MED ORDER — LISINOPRIL 5 MG PO TABS: 5.0000 mg | ORAL_TABLET | Freq: Every day | ORAL | 1 refills | Status: DC

## 2018-01-25 MED ORDER — ATORVASTATIN CALCIUM 80 MG PO TABS: 80.0000 mg | ORAL_TABLET | Freq: Every day | ORAL | 1 refills | Status: DC

## 2018-01-25 MED ORDER — BUSPIRONE HCL 10 MG PO TABS
10.0000 mg | ORAL_TABLET | Freq: Two times a day (BID) | ORAL | 1 refills | Status: DC
Start: 2018-01-25 — End: 2018-07-09

## 2018-01-25 MED ORDER — CARVEDILOL 12.5 MG PO TABS: 12.5000 mg | ORAL_TABLET | Freq: Two times a day (BID) | ORAL | 1 refills | Status: DC

## 2018-01-25 MED ORDER — CLOPIDOGREL BISULFATE 75 MG PO TABS: 75.0000 mg | ORAL_TABLET | Freq: Every day | ORAL | 1 refills | Status: DC

## 2018-01-25 NOTE — Progress Notes (Signed)
Subjective:  Patient ID: Thomas Woods, male    DOB: 24-Oct-1965  Age: 52 y.o. MRN: [id number]  CC: Medication Refill   HPI Thomas Woods is a 53 year old male with a history of HTN, Hyperlipidemia, Ischemic Cardiomyopathy (EF 50-55%  From 2016), s/p overlapping LAD stents last seen in the clinic over a year ago who presents today for follow up visit.  He has been out of his medications for over four months and has noticed his anxiety worsening. He states he feels sluggish but was more energetic while on his medications. Chest pains occur infrequently with the last one about 6 months ago and resolved with Nitroglycerine use. Last saw Cardiology in 01/2017. His blood pressure is elevated due to running out of his antihypertensive.  Since his last visit he has had two eye surgeries for Glaucoma. Last seen by Cardiology in 01/2017.  Past Medical History:  Diagnosis Date  . Hyperlipidemia LDL goal <70 03/27/2014  . Hypertension   . Ischemic cardiomyopathy 03/27/2014   EF 30% at cath, confirmed by echo  . Multiple lung nodules on CT 03/28/2014   Right lung nodules up to 6 mm, repeat CT in 6-12 months  . 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 NSTEMI (non-ST elevated myocardial infarction) ([facility]) 03/26/2014   Patient pain increased and he developed ECG changes, taken to the cath lab as a STEMI, PCI to the LAD    Past Surgical History:  Procedure Laterality Date  . HERNIA REPAIR    . LEFT HEART CATHETERIZATION WITH CORONARY ANGIOGRAM N/A 03/27/2014   Procedure: LEFT HEART CATHETERIZATION WITH CORONARY ANGIOGRAM;  Surgeon: Jettie Booze, MD; L main okay, LAD 100%, CFX okay, RI moderate disease, OM1 mild disease, RCA moderate disease, EF 30%  . PERCUTANEOUS CORONARY STENT INTERVENTION (PCI-S)  03/27/2014   3.0 x 32 Synergy overlapped with 2.5 x 38 Synergy to the mLAD    No Known Allergies   Outpatient Medications Prior to Visit  Medication Sig  Dispense Refill  . aspirin 81 MG tablet Take 1 tablet (81 mg total) by mouth daily. 30 tablet 3  . CHANTIX CONTINUING MONTH PAK 1 MG tablet TAKE 1 TABLET BY MOUTH 2 TIMES DAILY 56 tablet 3  . varenicline (CHANTIX) 0.5 MG tablet Take 1 tablet (0.5 mg total) by mouth 2 (two) times daily. 14 tablet 0  . VIAGRA 50 MG tablet TAKE 1 TABLET BY MOUTH DAILY AS NEEDED FOR ERECTILE DYSFUNCTION 30 tablet 3  . atorvastatin (LIPITOR) 80 MG tablet Take 1 tablet (80 mg total) by mouth daily. 90 tablet 3  . busPIRone (BUSPAR) 10 MG tablet Take 1 tablet (10 mg total) by mouth 2 (two) times daily. 60 tablet 3  . carvedilol (COREG) 12.5 MG tablet Take 1 tablet (12.5 mg total) by mouth 2 (two) times daily with a meal. 180 tablet 3  . clopidogrel (PLAVIX) 75 MG tablet Take 1 tablet (75 mg total) by mouth daily. 90 tablet 3  . lisinopril (PRINIVIL,ZESTRIL) 5 MG tablet Take 1 tablet (5 mg total) by mouth daily. 90 tablet 3  . methocarbamol (ROBAXIN) 500 MG tablet Take 1 tablet (500 mg total) by mouth 4 (four) times daily. 60 tablet 0  . nitroGLYCERIN (NITROSTAT) 0.4 MG SL tablet Place 1 tablet (0.4 mg total) under the tongue every 5 (five) minutes as needed for chest pain (x 3 doses). 25 tablet 3  . traZODone (DESYREL) 50 MG tablet Take 0.5-1  tablets (25-50 mg total) by mouth at bedtime as needed for sleep. (Patient not taking: Reported on 01/25/2018) 30 tablet 3   No facility-administered medications prior to visit.     ROS Review of Systems  Constitutional: Negative for activity change and appetite change.  HENT: Negative for sinus pressure and sore throat.   Eyes: Negative for visual disturbance.  Respiratory: Negative for cough, chest tightness and shortness of breath.   Cardiovascular: Negative for chest pain and leg swelling.  Gastrointestinal: Negative for abdominal distention, abdominal pain, constipation and diarrhea.  Endocrine: Negative.   Genitourinary: Negative for dysuria.  Musculoskeletal: Negative  for joint swelling and myalgias.  Skin: Negative for rash.  Allergic/Immunologic: Negative.   Neurological: Negative for weakness, light-headedness and numbness.  Psychiatric/Behavioral: Negative for dysphoric mood and suicidal ideas.       Positive for anxiety    Objective:  BP (!) 157/83   Pulse 92   Temp 98.1 F (36.7 C) (Oral)   Ht 6' (1.829 m)   Wt 211 lb 3.2 oz (95.8 kg)   SpO2 100%   BMI 28.64 kg/m   BP/Weight 01/25/2018 01/31/2017 10/26/5100  Systolic BP 585 277 824  Diastolic BP 83 74 72  Wt. (Lbs) 211.2 206 204.6  BMI 28.64 27.94 27.75      Physical Exam Constitutional:      Appearance: He is well-developed.  Cardiovascular:     Rate and Rhythm: Normal rate.     Heart sounds: Normal heart sounds. No murmur.  Pulmonary:     Effort: Pulmonary effort is normal.     Breath sounds: Normal breath sounds. No wheezing or rales.  Chest:     Chest wall: No tenderness.  Abdominal:     General: Bowel sounds are normal. There is no distension.     Palpations: Abdomen is soft. There is no mass.     Tenderness: There is no abdominal tenderness.  Musculoskeletal: Normal range of motion.  Neurological:     Mental Status: He is alert and oriented to person, place, and time.      Assessment & Plan:   1. Cardiomyopathy, ischemic EF 50-55% Euvolemic Continue ACEI, BB Keep follow up appointment with Cardiology - CMP14+EGFR; Future - lisinopril (PRINIVIL,ZESTRIL) 5 MG tablet; Take 1 tablet (5 mg total) by mouth daily.  Dispense: 90 tablet; Refill: 1 - atorvastatin (LIPITOR) 80 MG tablet; Take 1 tablet (80 mg total) by mouth daily.  Dispense: 90 tablet; Refill: 1 - carvedilol (COREG) 12.5 MG tablet; Take 1 tablet (12.5 mg total) by mouth 2 (two) times daily with a meal.  Dispense: 180 tablet; Refill: 1 - clopidogrel (PLAVIX) 75 MG tablet; Take 1 tablet (75 mg total) by mouth daily.  Dispense: 90 tablet; Refill: 1 - Lipid panel; Future  2. Adjustment disorder with mixed  anxiety and depressed mood Increasing anxiety ever since he ran out of Buspar which I have refilled - busPIRone (BUSPAR) 10 MG tablet; Take 1 tablet (10 mg total) by mouth 2 (two) times daily.  Dispense: 180 tablet; Refill: 1  3. Essential hypertension Uncontrolled - has been out of medications which I have refilled Counseled on blood pressure goal of less than 130/80, low-sodium, DASH diet, medication compliance, 150 minutes of moderate intensity exercise per week. Discussed medication compliance, adverse effects.   4. Mixed hyperlipidemia If lipids are elevated I will make no regimen changes as he has been out of his Statin  5. Screening for viral disease - HIV antibody (with reflex);  Future   Meds ordered this encounter  Medications  . lisinopril (PRINIVIL,ZESTRIL) 5 MG tablet    Sig: Take 1 tablet (5 mg total) by mouth daily.    Dispense:  90 tablet    Refill:  1  . atorvastatin (LIPITOR) 80 MG tablet    Sig: Take 1 tablet (80 mg total) by mouth daily.    Dispense:  90 tablet    Refill:  1  . busPIRone (BUSPAR) 10 MG tablet    Sig: Take 1 tablet (10 mg total) by mouth 2 (two) times daily.    Dispense:  180 tablet    Refill:  1  . carvedilol (COREG) 12.5 MG tablet    Sig: Take 1 tablet (12.5 mg total) by mouth 2 (two) times daily with a meal.    Dispense:  180 tablet    Refill:  1  . clopidogrel (PLAVIX) 75 MG tablet    Sig: Take 1 tablet (75 mg total) by mouth daily.    Dispense:  90 tablet    Refill:  1    Follow-up: Return in about 3 months (around 04/26/2018) for Follow-up of chronic medical conditions.   Charlott Rakes MD

## 2018-01-25 NOTE — Patient Instructions (Signed)

## 2018-01-26 ENCOUNTER — Ambulatory Visit: Payer: BLUE CROSS/BLUE SHIELD | Attending: Family Medicine

## 2018-01-26 DIAGNOSIS — Z1159 Encounter for screening for other viral diseases: Secondary | ICD-10-CM

## 2018-01-26 DIAGNOSIS — I255 Ischemic cardiomyopathy: Secondary | ICD-10-CM | POA: Insufficient documentation

## 2018-01-26 MED FILL — LISINOPRIL 5 MG TAB: 5 | 30 days supply | Qty: 30 | Fill #0

## 2018-01-26 MED FILL — CARVEDILOL 12.5 MG TABLET: 12.5 | 30 days supply | Qty: 60 | Fill #0

## 2018-01-26 MED FILL — busPIRone HCL 10 MG TABS: 10 | 30 days supply | Qty: 60 | Fill #0

## 2018-01-27 LAB — CMP14+EGFR
ALT: 12 IU/L (ref 0–44)
AST: 18 IU/L (ref 0–40)
Albumin/Globulin Ratio: 1.7 (ref 1.2–2.2)
Albumin: 4.7 g/dL (ref 3.5–5.5)
Alkaline Phosphatase: 48 IU/L (ref 39–117)
BUN/Creatinine Ratio: 10 (ref 9–20)
BUN: 10 mg/dL (ref 6–24)
Bilirubin Total: 0.7 mg/dL (ref 0.0–1.2)
CO2: 24 mmol/L (ref 20–29)
Calcium: 9.7 mg/dL (ref 8.7–10.2)
Chloride: 101 mmol/L (ref 96–106)
Creatinine, Ser: 1 mg/dL (ref 0.76–1.27)
GFR calc Af Amer: 100 mL/min/{1.73_m2} (ref >59)
GFR calc non Af Amer: 87 mL/min/{1.73_m2} (ref >59)
Globulin, Total: 2.8 g/dL (ref 1.5–4.5)
Glucose: 88 mg/dL (ref 65–99)
Potassium: 4.3 mmol/L (ref 3.5–5.2)
Sodium: 141 mmol/L (ref 134–144)
Total Protein: 7.5 g/dL (ref 6.0–8.5)

## 2018-01-27 LAB — LIPID PANEL
CHOL/HDL RATIO: 3.1 ratio (ref 0.0–5.0)
Cholesterol, Total: 229 mg/dL — ABNORMAL HIGH (ref 100–199)
HDL: 75 mg/dL (ref >39)
LDL Calculated: 139 mg/dL — ABNORMAL HIGH (ref 0–99)
Triglycerides: 75 mg/dL (ref 0–149)
VLDL Cholesterol Cal: 15 mg/dL (ref 5–40)

## 2018-01-27 LAB — HIV ANTIBODY (ROUTINE TESTING W REFLEX): HIV Screen 4th Generation wRfx: NONREACTIVE

## 2018-02-26 ENCOUNTER — Ambulatory Visit: Payer: Self-pay | Admitting: Family Medicine

## 2018-02-28 MED FILL — CLOPIDOGREL 75 MG TABLET: 75 | 30 days supply | Qty: 30 | Fill #0

## 2018-02-28 MED FILL — LISINOPRIL 5 MG TAB: 5 | 30 days supply | Qty: 30 | Fill #1

## 2018-02-28 MED FILL — ATORVASTATIN 80 MG TABLET: 80 | 30 days supply | Qty: 30 | Fill #0

## 2018-02-28 MED FILL — busPIRone HCL 10 MG TABS: 10 | 30 days supply | Qty: 60 | Fill #1

## 2018-02-28 MED FILL — CARVEDILOL 12.5 MG TABLET: 12.5 | 30 days supply | Qty: 60 | Fill #1

## 2018-02-28 NOTE — Progress Notes (Signed)
Cardiology Office Note   Date:  03/01/2018   ID:  Thomas Woods, DOB November 28, 1965, MRN [id number]  PCP:  Hoy Register, MD    No chief complaint on file.  CAD  Wt Readings from Last 3 Encounters:  03/01/18 209 lb 6.4 oz (95 kg)  01/25/18 211 lb 3.2 oz (95.8 kg)  01/31/17 206 lb (93.4 kg)       History of Present Illness: Thomas Woods is a 53 y.o. male  Who had an anterior MI in 3/16.Angina was a burning sensation.He had 2 long overlapping drug-eluting stents to the LAD. His EF was low at 30%. He had an apical thrombus, and was treated with warfarin for a few months. EF came back to low normal in 6/16.  He has tried patches and pills to stop smoking.  He has been unsuccessful.  In late 2019, he had two eye surgeries for glaucoma in [place].  He was unable to exercise.  No lifting was allowed.    He has some DOE. It occurs occasionally.  He has some joint pains as well limiting his activity.    Denies : Chest pain. Dizziness. Leg edema. Nitroglycerin use. Orthopnea. Palpitations. Paroxysmal nocturnal dyspnea. Syncope.   No sx like his prior MI.    Past Medical History:  Diagnosis Date  . Hyperlipidemia LDL goal <70 03/27/2014  . Hypertension   . Ischemic cardiomyopathy 03/27/2014   EF 30% at cath, confirmed by echo  . Multiple lung nodules on CT 03/28/2014   Right lung nodules up to 6 mm, repeat CT in 6-12 months  . 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 NSTEMI (non-ST elevated myocardial infarction) ([facility]) 03/26/2014   Patient pain increased and he developed ECG changes, taken to the cath lab as a STEMI, PCI to the LAD    Past Surgical History:  Procedure Laterality Date  . HERNIA REPAIR    . LEFT HEART CATHETERIZATION WITH CORONARY ANGIOGRAM N/A 03/27/2014   Procedure: LEFT HEART CATHETERIZATION WITH CORONARY ANGIOGRAM;  Surgeon: Corky Crafts, MD; L main okay, LAD 100%, CFX okay, RI moderate disease, OM1 mild disease,  RCA moderate disease, EF 30%  . PERCUTANEOUS CORONARY STENT INTERVENTION (PCI-S)  03/27/2014   3.0 x 32 Synergy overlapped with 2.5 x 38 Synergy to the mLAD     Current Outpatient Medications  Medication Sig Dispense Refill  . aspirin 81 MG tablet Take 1 tablet (81 mg total) by mouth daily. 30 tablet 3  . atorvastatin (LIPITOR) 80 MG tablet Take 1 tablet (80 mg total) by mouth daily. 90 tablet 1  . busPIRone (BUSPAR) 10 MG tablet Take 1 tablet (10 mg total) by mouth 2 (two) times daily. 180 tablet 1  . carvedilol (COREG) 12.5 MG tablet Take 1 tablet (12.5 mg total) by mouth 2 (two) times daily with a meal. 180 tablet 1  . CHANTIX CONTINUING MONTH PAK 1 MG tablet TAKE 1 TABLET BY MOUTH 2 TIMES DAILY 56 tablet 3  . clopidogrel (PLAVIX) 75 MG tablet Take 1 tablet (75 mg total) by mouth daily. 90 tablet 1  . lisinopril (PRINIVIL,ZESTRIL) 5 MG tablet Take 1 tablet (5 mg total) by mouth daily. 90 tablet 1  . prednisoLONE acetate (PRED FORTE) 1 % ophthalmic suspension Apply 1 drop to eye daily.    . varenicline (CHANTIX) 0.5 MG tablet Take 1 tablet (0.5 mg total) by mouth 2 (two) times daily. 14 tablet 0  . VIAGRA  50 MG tablet TAKE 1 TABLET BY MOUTH DAILY AS NEEDED FOR ERECTILE DYSFUNCTION 30 tablet 3  . nitroGLYCERIN (NITROSTAT) 0.4 MG SL tablet Place 1 tablet (0.4 mg total) under the tongue every 5 (five) minutes as needed for chest pain (x 3 doses). 25 tablet 3   No current facility-administered medications for this visit.     Allergies:   Patient has no known allergies.    Social History:  The patient  reports that he has been smoking cigarettes. He has been smoking about 0.20 packs per day. He has never used smokeless tobacco. He reports current alcohol use. He reports that he does not use drugs.   Family History:  The patient's family history includes Healthy in his brother, mother, and sister.    ROS:  Please see the history of present illness.   Otherwise, review of systems are  positive for eye issues.   All other systems are reviewed and negative.    PHYSICAL EXAM: VS:  BP 130/84   Pulse 67   Ht 6' (1.829 m)   Wt 209 lb 6.4 oz (95 kg)   SpO2 97%   BMI 28.40 kg/m  , BMI Body mass index is 28.4 kg/m. GEN: Well nourished, well developed, in no acute distress  HEENT: normal  Neck: no JVD, carotid bruits, or masses Cardiac: RRR; no murmurs, rubs, or gallops,no edema  Respiratory:  clear to auscultation bilaterally, normal work of breathing GI: soft, nontender, nondistended, + BS MS: no deformity or atrophy  Skin: warm and dry, no rash Neuro:  Strength and sensation are intact Psych: euthymic mood, full affect   EKG:   The ekg ordered today demonstrates NSR, no ST changes   Recent Labs: 01/26/2018: ALT 12; BUN 10; Creatinine, Ser 1.00; Potassium 4.3; Sodium 141   Lipid Panel    Component Value Date/Time   CHOL 229 (H) 01/26/2018 1036   TRIG 75 01/26/2018 1036   HDL 75 01/26/2018 1036   CHOLHDL 3.1 01/26/2018 1036   CHOLHDL 2.1 12/07/2015 1053   VLDL 8 12/07/2015 1053   LDLCALC 139 (H) 01/26/2018 1036     Other studies Reviewed: Additional studies/ records that were reviewed today with results demonstrating:labs reviewed.   ASSESSMENT AND PLAN:  1. CAD/MI: Angina controlled on medical therapy.  Continue aggressive secondary prevention.  2. Hyperlipidemia: LDL above target in Jan 2020.  He was off his statin.  He has just restarted, but needs a refill. Recheck labs in 5/20. 3. HTN: The current medical regimen is effective;  continue present plan and medications. 4. Tobacco abuse: He continues to smoke.  Chantix helped, but he had issues with cost.  5. Erectile dysfunction: OK to use Viagra since he is not using nitrates.  6. Unable to work until at least April 2020.     Current medicines are reviewed at length with the patient today.  The patient concerns regarding his medicines were addressed.  The following changes have been made:  No  change  Labs/ tests ordered today include:  No orders of the defined types were placed in this encounter.   Recommend 150 minutes/week of aerobic exercise Low fat, low carb, high fiber diet recommended  Disposition:   FU in 1 year   Signed, Lance Muss, MD  03/01/2018 11:32 AM    [facility] [address] [place], [place], [place]  [address] Phone: [phone]; Fax: [phone]

## 2018-03-01 ENCOUNTER — Encounter: Payer: Self-pay | Admitting: Interventional Cardiology

## 2018-03-01 ENCOUNTER — Ambulatory Visit (INDEPENDENT_AMBULATORY_CARE_PROVIDER_SITE_OTHER): Payer: BLUE CROSS/BLUE SHIELD | Admitting: Interventional Cardiology

## 2018-03-01 VITALS — BP 130/84 | HR 67 | Ht 72.0 in | Wt 209.4 lb

## 2018-03-01 DIAGNOSIS — I10 Essential (primary) hypertension: Secondary | ICD-10-CM | POA: Diagnosis not present

## 2018-03-01 DIAGNOSIS — I252 Old myocardial infarction: Secondary | ICD-10-CM

## 2018-03-01 DIAGNOSIS — E782 Mixed hyperlipidemia: Secondary | ICD-10-CM | POA: Diagnosis not present

## 2018-03-01 DIAGNOSIS — Z720 Tobacco use: Secondary | ICD-10-CM | POA: Diagnosis not present

## 2018-03-01 DIAGNOSIS — I25119 Atherosclerotic heart disease of native coronary artery with unspecified angina pectoris: Secondary | ICD-10-CM | POA: Diagnosis not present

## 2018-03-01 DIAGNOSIS — I255 Ischemic cardiomyopathy: Secondary | ICD-10-CM

## 2018-03-01 MED ORDER — CARVEDILOL 12.5 MG PO TABS: 12.5000 mg | ORAL_TABLET | Freq: Two times a day (BID) | ORAL | 3 refills | Status: DC

## 2018-03-01 MED ORDER — CLOPIDOGREL BISULFATE 75 MG PO TABS: 75.0000 mg | ORAL_TABLET | Freq: Every day | ORAL | 3 refills | Status: DC

## 2018-03-01 MED ORDER — LISINOPRIL 5 MG PO TABS: 5.0000 mg | ORAL_TABLET | Freq: Every day | ORAL | 3 refills | Status: DC

## 2018-03-01 MED ORDER — SILDENAFIL CITRATE 50 MG PO TABS: ORAL_TABLET | ORAL | 3 refills | Status: DC

## 2018-03-01 MED ORDER — ATORVASTATIN CALCIUM 80 MG PO TABS: 80.0000 mg | ORAL_TABLET | Freq: Every day | ORAL | 3 refills | Status: DC

## 2018-03-01 MED FILL — !VIAGRA 50 MG TABLET: 50 | 30 days supply | Qty: 30 | Fill #0

## 2018-03-01 NOTE — Patient Instructions (Signed)
Medication Instructions:  Your physician recommends that you continue on your current medications as directed. Please refer to the Current Medication list given to you today.  If you need a refill on your cardiac medications before your next appointment, please call your pharmacy.   Lab work: Your physician recommends that you return for a FASTING lipid profile and liver function panel in MAY   If you have labs (blood work) drawn today and your tests are completely normal, you will receive your results only by: Marland Kitchen MyChart Message (if you have MyChart) OR . A paper copy in the mail If you have any lab test that is abnormal or we need to change your treatment, we will call you to review the results.  Testing/Procedures: None ordered  Follow-Up: At [facility], you and your health needs are our priority.  As part of our continuing mission to provide you with exceptional heart care, we have created designated Provider Care Teams.  These Care Teams include your primary Cardiologist (physician) and Advanced Practice Providers (APPs -  Physician Assistants and Nurse Practitioners) who all work together to provide you with the care you need, when you need it. . You will need a follow up appointment in 1 year.  Please call our office 2 months in advance to schedule this appointment.  You may see Everette Rank, MD or one of the following Advanced Practice Providers on your designated Care Team:   . Robbie Lis, PA-C . Dayna Dunn, PA-C . Jacolyn Reedy, PA-C  Any Other Special Instructions Will Be Listed Below (If Applicable).

## 2018-04-03 MED FILL — CARVEDILOL 12.5 MG TABLET: 12.5 | 30 days supply | Qty: 60 | Fill #2

## 2018-04-03 MED FILL — busPIRone HCL 10 MG TABS: 10 | 30 days supply | Qty: 60 | Fill #2

## 2018-04-03 MED FILL — !VIAGRA 50 MG TABLET: 50 | 30 days supply | Qty: 10 | Fill #1

## 2018-04-03 MED FILL — LISINOPRIL 5 MG TAB: 5 | 30 days supply | Qty: 30 | Fill #2

## 2018-04-03 MED FILL — ATORVASTATIN 80 MG TABLET: 80 | 30 days supply | Qty: 30 | Fill #1

## 2018-04-03 MED FILL — CLOPIDOGREL 75 MG TABLET: 75 | 30 days supply | Qty: 30 | Fill #1

## 2018-04-30 ENCOUNTER — Other Ambulatory Visit: Payer: Self-pay

## 2018-04-30 ENCOUNTER — Ambulatory Visit: Payer: BLUE CROSS/BLUE SHIELD | Attending: Family Medicine | Admitting: Family Medicine

## 2018-04-30 ENCOUNTER — Encounter: Payer: Self-pay | Admitting: Family Medicine

## 2018-04-30 DIAGNOSIS — I255 Ischemic cardiomyopathy: Secondary | ICD-10-CM | POA: Diagnosis not present

## 2018-04-30 DIAGNOSIS — F17210 Nicotine dependence, cigarettes, uncomplicated: Secondary | ICD-10-CM | POA: Diagnosis not present

## 2018-04-30 DIAGNOSIS — Z720 Tobacco use: Secondary | ICD-10-CM

## 2018-04-30 DIAGNOSIS — I10 Essential (primary) hypertension: Secondary | ICD-10-CM

## 2018-04-30 DIAGNOSIS — M5431 Sciatica, right side: Secondary | ICD-10-CM | POA: Diagnosis not present

## 2018-04-30 MED ORDER — CLOPIDOGREL BISULFATE 75 MG PO TABS: 75.0000 mg | ORAL_TABLET | Freq: Every day | ORAL | 1 refills | Status: DC

## 2018-04-30 MED ORDER — LISINOPRIL 5 MG PO TABS: 5.0000 mg | ORAL_TABLET | Freq: Every day | ORAL | 1 refills | Status: DC

## 2018-04-30 MED ORDER — ATORVASTATIN CALCIUM 80 MG PO TABS: 80.0000 mg | ORAL_TABLET | Freq: Every day | ORAL | 1 refills | Status: DC

## 2018-04-30 MED ORDER — VARENICLINE TARTRATE 1 MG PO TABS: 1.0000 mg | ORAL_TABLET | Freq: Two times a day (BID) | ORAL | 3 refills | Status: DC

## 2018-04-30 MED ORDER — METHOCARBAMOL 500 MG PO TABS: 500.0000 mg | ORAL_TABLET | Freq: Three times a day (TID) | ORAL | 1 refills | Status: DC | PRN

## 2018-04-30 MED FILL — ATORVASTATIN 80 MG TABLET: 80 | 30 days supply | Qty: 30 | Fill #0

## 2018-04-30 MED FILL — CHANTIX 1 MG CONT MONTH BOX: 1 | 30 days supply | Qty: 60 | Fill #0

## 2018-04-30 MED FILL — LISINOPRIL 5 MG TAB: 5 | 30 days supply | Qty: 30 | Fill #0

## 2018-04-30 MED FILL — CLOPIDOGREL 75 MG TABLET: 75 | 30 days supply | Qty: 30 | Fill #0

## 2018-04-30 MED FILL — METHOCARBAMOL 500 MG TABS: 500 | 30 days supply | Qty: 90 | Fill #0

## 2018-04-30 NOTE — Progress Notes (Signed)
Virtual Visit via Telephone Note  I connected with Thomas Woods, on 04/30/2018 at 3:01 PM by telephone and verified that I am speaking with the correct person using two identifiers.   Consent: I discussed the limitations, risks, security and privacy concerns of performing an evaluation and management service by telephone and the availability of in person appointments. I also discussed with the patient that there may be a patient responsible charge related to this service. The patient expressed understanding and agreed to proceed.   Location of Patient: Home  Location of Provider: Clinic   Persons participating in Telemedicine visit: Gabriel Earing Farrington-CMA Dr. Wendi Maya     History of Present Illness: Thomas Woods is a 53 year old male with a history of HTN, Hyperlipidemia, Ischemic Cardiomyopathy (EF 50-55%  From 2016), s/p overlapping LAD stents last seen in the clinic over a year ago who presents today for follow up visit. He endorses compliance with his medications and denies chest pain, dyspnea, pedal edema. He continues to smoke 6 cigarettes/day and would like to get back on Chantix as he did better while on this in the past.  Over the last few months he has had right hip pain worse when he stands from a sitting position and attempts to walk.  This is associated with stiffness and limping which improves as he continues to walk.  He sometimes has to hit his right hip for relief.  Pain is worse when he lies on affected side and he thinks he needs an x-ray to evaluate further.  Denies history of trauma.   Past Medical History:  Diagnosis Date  . Hyperlipidemia LDL goal <70 03/27/2014  . Hypertension   . Ischemic cardiomyopathy 03/27/2014   EF 30% at cath, confirmed by echo  . Multiple lung nodules on CT 03/28/2014   Right lung nodules up to 6 mm, repeat CT in 6-12 months  . 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 NSTEMI (non-ST  elevated myocardial infarction) ([facility]) 03/26/2014   Patient pain increased and he developed ECG changes, taken to the cath lab as a STEMI, PCI to the LAD   No Known Allergies  Current Outpatient Medications on File Prior to Visit  Medication Sig Dispense Refill  . aspirin 81 MG tablet Take 1 tablet (81 mg total) by mouth daily. 30 tablet 3  . atorvastatin (LIPITOR) 80 MG tablet Take 1 tablet (80 mg total) by mouth daily. 90 tablet 3  . busPIRone (BUSPAR) 10 MG tablet Take 1 tablet (10 mg total) by mouth 2 (two) times daily. 180 tablet 1  . carvedilol (COREG) 12.5 MG tablet Take 1 tablet (12.5 mg total) by mouth 2 (two) times daily with a meal. 180 tablet 3  . clopidogrel (PLAVIX) 75 MG tablet Take 1 tablet (75 mg total) by mouth daily. 90 tablet 3  . lisinopril (PRINIVIL,ZESTRIL) 5 MG tablet Take 1 tablet (5 mg total) by mouth daily. 90 tablet 3  . prednisoLONE acetate (PRED FORTE) 1 % ophthalmic suspension Apply 1 drop to eye daily.    . sildenafil (VIAGRA) 50 MG tablet TAKE 1 TABLET BY MOUTH DAILY AS NEEDED FOR ERECTILE DYSFUNCTION 30 tablet 3  . CHANTIX CONTINUING MONTH PAK 1 MG tablet TAKE 1 TABLET BY MOUTH 2 TIMES DAILY (Patient not taking: Reported on 04/30/2018) 56 tablet 3  . nitroGLYCERIN (NITROSTAT) 0.4 MG SL tablet Place 1 tablet (0.4 mg total) under the tongue every 5 (five) minutes  as needed for chest pain (x 3 doses). 25 tablet 3  . varenicline (CHANTIX) 0.5 MG tablet Take 1 tablet (0.5 mg total) by mouth 2 (two) times daily. (Patient not taking: Reported on 04/30/2018) 14 tablet 0   No current facility-administered medications on file prior to visit.     Observations/Objective: Alert, awake, oriented x3 Not in acute distress    CMP Latest Ref Rng & Units 01/26/2018 11/09/2016 12/07/2015  Glucose 65 - 99 mg/dL 88 72 161(W101(H)  BUN 6 - 24 mg/dL 10 11 16   Creatinine 0.76 - 1.27 mg/dL 9.601.00 4.540.80 0.980.91  Sodium 134 - 144 mmol/L 141 141 138  Potassium 3.5 - 5.2 mmol/L 4.3 4.0 4.2   Chloride 96 - 106 mmol/L 101 99 103  CO2 20 - 29 mmol/L 24 25 28   Calcium 8.7 - 10.2 mg/dL 9.7 11.910.1 9.3  Total Protein 6.0 - 8.5 g/dL 7.5 7.9 7.3  Total Bilirubin 0.0 - 1.2 mg/dL 0.7 1.4(N1.6(H) 0.8  Alkaline Phos 39 - 117 IU/L 48 49 45  AST 0 - 40 IU/L 18 21 24   ALT 0 - 44 IU/L 12 11 22     Lipid Panel     Component Value Date/Time   CHOL 229 (H) 01/26/2018 1036   TRIG 75 01/26/2018 1036   HDL 75 01/26/2018 1036   CHOLHDL 3.1 01/26/2018 1036   CHOLHDL 2.1 12/07/2015 1053   VLDL 8 12/07/2015 1053   LDLCALC 139 (H) 01/26/2018 1036     Assessment and Plan: 1. Sciatica of right side Advised to commence sciatica exercises No imaging indicated at this time but will consider at next visit if symptoms persist - methocarbamol (ROBAXIN) 500 MG tablet; Take 1 tablet (500 mg total) by mouth every 8 (eight) hours as needed for muscle spasms.  Dispense: 90 tablet; Refill: 1  2. Cardiomyopathy, ischemic LAD stent EF 50 to 55% from echo of 06/2014 Risk factor modification - lisinopril (ZESTRIL) 5 MG tablet; Take 1 tablet (5 mg total) by mouth daily.  Dispense: 90 tablet; Refill: 1 - clopidogrel (PLAVIX) 75 MG tablet; Take 1 tablet (75 mg total) by mouth daily.  Dispense: 90 tablet; Refill: 1 - atorvastatin (LIPITOR) 80 MG tablet; Take 1 tablet (80 mg total) by mouth daily.  Dispense: 90 tablet; Refill: 1  3. Tobacco abuse Spent 3 minutes counseling on smoking cessation and he is working on quitting - varenicline (CHANTIX CONTINUING MONTH PAK) 1 MG tablet; Take 1 tablet (1 mg total) by mouth 2 (two) times daily.  Dispense: 60 tablet; Refill: 3  4. Essential hypertension Advised to keep home blood pressure log Continue Coreg, lisinopril Counseled on blood pressure goal of less than 130/80, low-sodium, DASH diet, medication compliance, 150 minutes of moderate intensity exercise per week. Discussed medication compliance, adverse effects.    Follow Up Instructions: Return in about 3 months  (around 07/30/2018).    I discussed the assessment and treatment plan with the patient. The patient was provided an opportunity to ask questions and all were answered. The patient agreed with the plan and demonstrated an understanding of the instructions.   The patient was advised to call back or seek an in-person evaluation if the symptoms worsen or if the condition fails to improve as anticipated.     I provided 16 minutes total of non-face-to-face time during this encounter including median intraservice time, reviewing previous notes, labs, imaging, medications and explaining diagnosis and management.     Hoy RegisterEnobong Noble Cicalese, MD, FAAFP. [facility] [place], [place] [phone]   04/30/2018, 3:01 PM

## 2018-04-30 NOTE — Progress Notes (Signed)
Patient has been called and DOB has been verified. Patient has been screened and transferred to PCP to start phone visit.  C/C: hypertension, hip pian.

## 2018-05-14 MED FILL — !VIAGRA 50 MG TABLET: 50 | 30 days supply | Qty: 10 | Fill #2

## 2018-05-14 MED FILL — CARVEDILOL 12.5 MG TABLET: 12.5 | 30 days supply | Qty: 60 | Fill #3

## 2018-05-30 ENCOUNTER — Other Ambulatory Visit: Payer: BLUE CROSS/BLUE SHIELD

## 2018-06-01 MED FILL — PREDNISOLONE AC 1% EYE DROP: 1 | 19 days supply | Qty: 5 | Fill #0

## 2018-06-01 MED FILL — MOXIFLOXACIN HCL 0.5 % SOLN: 0.5 | 4 days supply | Qty: 3 | Fill #0

## 2018-06-08 MED FILL — CARVEDILOL 12.5 MG TABLET: 12.5 | 30 days supply | Qty: 60 | Fill #4

## 2018-06-08 MED FILL — ATORVASTATIN 80 MG TABLET: 80 | 30 days supply | Qty: 30 | Fill #1

## 2018-06-08 MED FILL — busPIRone HCL 10 MG TABS: 10 | 30 days supply | Qty: 60 | Fill #3

## 2018-06-08 MED FILL — LISINOPRIL 5 MG TAB: 5 | 30 days supply | Qty: 30 | Fill #1

## 2018-06-08 MED FILL — CHANTIX 1 MG CONT MONTH BOX: 1 | 30 days supply | Qty: 56 | Fill #1

## 2018-06-12 MED FILL — CLOPIDOGREL 75 MG TABLET: 75 | 30 days supply | Qty: 30 | Fill #1

## 2018-06-12 MED FILL — !VIAGRA 50 MG TABLET: 50 | 30 days supply | Qty: 10 | Fill #3

## 2018-06-15 ENCOUNTER — Telehealth: Payer: Self-pay | Admitting: Family Medicine

## 2018-06-15 NOTE — Telephone Encounter (Signed)
New Message   Pt dropped off disability paperwork for Dr. Alvis Lemmings to sign. Paperwork has been placed in Dr. Baxter Flattery box

## 2018-06-15 NOTE — Telephone Encounter (Signed)
Patient will be called once paperwork is ready for pick up. 

## 2018-06-28 ENCOUNTER — Telehealth: Payer: Self-pay

## 2018-06-28 NOTE — Telephone Encounter (Signed)
Patient was called and informed that he would need an appointment in order to get his paperwork completed. Patient has appointment set on 07/02/2018 at 150

## 2018-07-02 ENCOUNTER — Ambulatory Visit: Payer: BLUE CROSS/BLUE SHIELD | Admitting: Family Medicine

## 2018-07-09 ENCOUNTER — Other Ambulatory Visit: Payer: Self-pay

## 2018-07-09 ENCOUNTER — Ambulatory Visit: Payer: BLUE CROSS/BLUE SHIELD | Attending: Family Medicine | Admitting: Family Medicine

## 2018-07-09 ENCOUNTER — Encounter: Payer: Self-pay | Admitting: Family Medicine

## 2018-07-09 VITALS — BP 131/81 | HR 77 | Temp 99.1°F | Ht 72.0 in | Wt 208.0 lb

## 2018-07-09 DIAGNOSIS — I255 Ischemic cardiomyopathy: Secondary | ICD-10-CM

## 2018-07-09 DIAGNOSIS — Z0289 Encounter for other administrative examinations: Secondary | ICD-10-CM | POA: Diagnosis not present

## 2018-07-09 DIAGNOSIS — I25119 Atherosclerotic heart disease of native coronary artery with unspecified angina pectoris: Secondary | ICD-10-CM | POA: Diagnosis not present

## 2018-07-09 DIAGNOSIS — M5431 Sciatica, right side: Secondary | ICD-10-CM

## 2018-07-09 DIAGNOSIS — F4323 Adjustment disorder with mixed anxiety and depressed mood: Secondary | ICD-10-CM

## 2018-07-09 DIAGNOSIS — Z029 Encounter for administrative examinations, unspecified: Secondary | ICD-10-CM

## 2018-07-09 MED ORDER — GABAPENTIN 300 MG PO CAPS: 300.0000 mg | ORAL_CAPSULE | Freq: Two times a day (BID) | ORAL | 3 refills | Status: DC

## 2018-07-09 MED ORDER — BUSPIRONE HCL 10 MG PO TABS: 10.0000 mg | ORAL_TABLET | Freq: Two times a day (BID) | ORAL | 1 refills | Status: DC

## 2018-07-09 MED ORDER — LISINOPRIL 5 MG PO TABS: 5.0000 mg | ORAL_TABLET | Freq: Every day | ORAL | 1 refills | Status: DC

## 2018-07-09 MED FILL — ATORVASTATIN 80 MG TABLET: 80 | 30 days supply | Qty: 30 | Fill #2

## 2018-07-09 MED FILL — busPIRone HCL 10 MG TABS: 10 | 30 days supply | Qty: 60 | Fill #4

## 2018-07-09 MED FILL — CLOPIDOGREL 75 MG TABLET: 75 | 30 days supply | Qty: 30 | Fill #2

## 2018-07-09 MED FILL — LISINOPRIL 5 MG TAB: 5 | 30 days supply | Qty: 30 | Fill #2

## 2018-07-09 MED FILL — CHANTIX 1 MG CONT MONTH BOX: 1 | 30 days supply | Qty: 56 | Fill #2

## 2018-07-09 MED FILL — CARVEDILOL 12.5 MG TABLET: 12.5 | 30 days supply | Qty: 60 | Fill #5

## 2018-07-09 MED FILL — SILDENAFIL CITRATE 50 MG TA: 50 | 30 days supply | Qty: 4 | Fill #4

## 2018-07-09 NOTE — Progress Notes (Signed)
Subjective:  Patient ID: Thomas Woods, male    DOB: 1965-11-02  Age: 53 y.o. MRN: [id number]  CC: Hypertension and Hip Pain   HPI Thomas Woods is a 53 year old male with a history of HTN, Hyperlipidemia, Ischemic Cardiomyopathy (EF 50-55%  From 2016), s/p overlapping LAD stents who presents today for completion of disability paperwork. He informs me he is applying for "partial disability due to his heart attack".  He had applied for disability in the past and was denied. His symptoms include dyspnea on moderate exertion but he denies pedal edema, chest pain, dyspnea at rest. He also suffers from right sided sciatica which is uncontrolled on his muscle relaxant and he has to take 800 mg of Motrin for relief.   Past Medical History:  Diagnosis Date  . Hyperlipidemia LDL goal <70 03/27/2014  . Hypertension   . Ischemic cardiomyopathy 03/27/2014   EF 30% at cath, confirmed by echo  . Multiple lung nodules on CT 03/28/2014   Right lung nodules up to 6 mm, repeat CT in 6-12 months  . 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 NSTEMI (non-ST elevated myocardial infarction) (HCC) 03/26/2014   Patient pain increased and he developed ECG changes, taken to the cath lab as a STEMI, PCI to the LAD    Past Surgical History:  Procedure Laterality Date  . HERNIA REPAIR    . LEFT HEART CATHETERIZATION WITH CORONARY ANGIOGRAM N/A 03/27/2014   Procedure: LEFT HEART CATHETERIZATION WITH CORONARY ANGIOGRAM;  Surgeon: Corky CraftsJayadeep S Varanasi, MD; L main okay, LAD 100%, CFX okay, RI moderate disease, OM1 mild disease, RCA moderate disease, EF 30%  . PERCUTANEOUS CORONARY STENT INTERVENTION (PCI-S)  03/27/2014   3.0 x 32 Synergy overlapped with 2.5 x 38 Synergy to the mLAD    Family History  Problem Relation Age of Onset  . Healthy Mother   . Healthy Sister   . Healthy Brother   . Heart attack Neg Hx   . Hypertension Neg Hx   . Stroke Neg Hx     No Known Allergies   Outpatient Medications Prior to Visit  Medication Sig Dispense Refill  . atorvastatin (LIPITOR) 80 MG tablet Take 1 tablet (80 mg total) by mouth daily. 90 tablet 1  . carvedilol (COREG) 12.5 MG tablet Take 1 tablet (12.5 mg total) by mouth 2 (two) times daily with a meal. 180 tablet 3  . clopidogrel (PLAVIX) 75 MG tablet Take 1 tablet (75 mg total) by mouth daily. 90 tablet 1  . methocarbamol (ROBAXIN) 500 MG tablet Take 1 tablet (500 mg total) by mouth every 8 (eight) hours as needed for muscle spasms. 90 tablet 1  . sildenafil (VIAGRA) 50 MG tablet TAKE 1 TABLET BY MOUTH DAILY AS NEEDED FOR ERECTILE DYSFUNCTION 30 tablet 3  . varenicline (CHANTIX CONTINUING MONTH PAK) 1 MG tablet Take 1 tablet (1 mg total) by mouth 2 (two) times daily. 60 tablet 3  . busPIRone (BUSPAR) 10 MG tablet Take 1 tablet (10 mg total) by mouth 2 (two) times daily. 180 tablet 1  . lisinopril (ZESTRIL) 5 MG tablet Take 1 tablet (5 mg total) by mouth daily. 90 tablet 1  . aspirin 81 MG tablet Take 1 tablet (81 mg total) by mouth daily. (Patient not taking: Reported on 07/09/2018) 30 tablet 3  . nitroGLYCERIN (NITROSTAT) 0.4 MG SL tablet Place 1 tablet (0.4 mg total) under the tongue every 5 (five) minutes as needed  for chest pain (x 3 doses). 25 tablet 3  . prednisoLONE acetate (PRED FORTE) 1 % ophthalmic suspension Apply 1 drop to eye daily.    . varenicline (CHANTIX) 0.5 MG tablet Take 1 tablet (0.5 mg total) by mouth 2 (two) times daily. (Patient not taking: Reported on 04/30/2018) 14 tablet 0   No facility-administered medications prior to visit.      ROS Review of Systems  Constitutional: Negative for activity change and appetite change.  HENT: Negative for sinus pressure and sore throat.   Eyes: Negative for visual disturbance.  Respiratory: Negative for cough, chest tightness and shortness of breath.   Cardiovascular: Negative for chest pain and leg swelling.  Gastrointestinal: Negative for abdominal  distention, abdominal pain, constipation and diarrhea.  Endocrine: Negative.   Genitourinary: Negative for dysuria.  Musculoskeletal:       See hpi  Skin: Negative for rash.  Allergic/Immunologic: Negative.   Neurological: Negative for weakness, light-headedness and numbness.  Psychiatric/Behavioral: Negative for dysphoric mood and suicidal ideas.    Objective:  BP 131/81   Pulse 77   Temp 99.1 F (37.3 C) (Oral)   Ht 6' (1.829 m)   Wt 208 lb (94.3 kg)   SpO2 99%   BMI 28.21 kg/m   BP/Weight 07/09/2018 03/01/2018 09/03/537  Systolic BP 767 341 937  Diastolic BP 81 84 83  Wt. (Lbs) 208 209.4 211.2  BMI 28.21 28.4 28.64      Physical Exam Constitutional:      Appearance: He is well-developed.  Neck:     Comments: No JVD Cardiovascular:     Rate and Rhythm: Normal rate.     Heart sounds: Normal heart sounds. No murmur.  Pulmonary:     Effort: Pulmonary effort is normal.     Breath sounds: Normal breath sounds. No wheezing or rales.  Chest:     Chest wall: No tenderness.  Abdominal:     General: Bowel sounds are normal. There is no distension.     Palpations: Abdomen is soft. There is no mass.     Tenderness: There is no abdominal tenderness.  Musculoskeletal:     Right lower leg: No edema.     Left lower leg: No edema.     Comments: Positive straight leg raise on the R  Neurological:     Mental Status: He is alert and oriented to person, place, and time.  Psychiatric:        Mood and Affect: Mood normal.        Behavior: Behavior normal.     CMP Latest Ref Rng & Units 01/26/2018 11/09/2016 12/07/2015  Glucose 65 - 99 mg/dL 88 72 101(H)  BUN 6 - 24 mg/dL 10 11 16   Creatinine 0.76 - 1.27 mg/dL 1.00 0.80 0.91  Sodium 134 - 144 mmol/L 141 141 138  Potassium 3.5 - 5.2 mmol/L 4.3 4.0 4.2  Chloride 96 - 106 mmol/L 101 99 103  CO2 20 - 29 mmol/L 24 25 28   Calcium 8.7 - 10.2 mg/dL 9.7 10.1 9.3  Total Protein 6.0 - 8.5 g/dL 7.5 7.9 7.3  Total Bilirubin 0.0 - 1.2  mg/dL 0.7 1.6(H) 0.8  Alkaline Phos 39 - 117 IU/L 48 49 45  AST 0 - 40 IU/L 18 21 24   ALT 0 - 44 IU/L 12 11 22     Lipid Panel     Component Value Date/Time   CHOL 229 (H) 01/26/2018 1036   TRIG 75 01/26/2018 1036   HDL 75  01/26/2018 1036   CHOLHDL 3.1 01/26/2018 1036   CHOLHDL 2.1 12/07/2015 1053   VLDL 8 12/07/2015 1053   LDLCALC 139 (H) 01/26/2018 1036    CBC    Component Value Date/Time   WBC 6.0 04/02/2014 0356   RBC 4.68 04/02/2014 0356   HGB 14.6 04/02/2014 0356   HCT 43.0 04/02/2014 0356   PLT 201 04/02/2014 0356   MCV 91.9 04/02/2014 0356   MCH 31.2 04/02/2014 0356   MCHC 34.0 04/02/2014 0356   RDW 14.1 04/02/2014 0356    No results found for: HGBA1C  Assessment & Plan:   1. Coronary artery disease involving native coronary artery of native heart with angina pectoris (HCC) Status post stent Asymptomatic  2. Sciatica of right side Uncontrolled on Robaxin We will add gabapentin to her regimen Counseled against around-the-clock use of NSAIDs as this could precipitate heart failure exacerbation but advised he can use it sporadically - gabapentin (NEURONTIN) 300 MG capsule; Take 1 capsule (300 mg total) by mouth 2 (two) times daily.  Dispense: 60 capsule; Refill: 3  3. Cardiomyopathy, ischemic Euvolemic - lisinopril (ZESTRIL) 5 MG tablet; Take 1 tablet (5 mg total) by mouth daily.  Dispense: 90 tablet; Refill: 1  4. Encounters for administrative purpose Disability paperwork completed  5. Adjustment disorder with mixed anxiety and depressed mood Stable - busPIRone (BUSPAR) 10 MG tablet; Take 1 tablet (10 mg total) by mouth 2 (two) times daily.  Dispense: 180 tablet; Refill: 1   Meds ordered this encounter  Medications  . gabapentin (NEURONTIN) 300 MG capsule    Sig: Take 1 capsule (300 mg total) by mouth 2 (two) times daily.    Dispense:  60 capsule    Refill:  3  . busPIRone (BUSPAR) 10 MG tablet    Sig: Take 1 tablet (10 mg total) by mouth 2 (two)  times daily.    Dispense:  180 tablet    Refill:  1  . lisinopril (ZESTRIL) 5 MG tablet    Sig: Take 1 tablet (5 mg total) by mouth daily.    Dispense:  90 tablet    Refill:  1    Follow-up: Return in about 3 months (around 10/09/2018) for medical conditions.       Hoy RegisterEnobong Hadley Detloff, MD, FAAFP. [facility] [place], [place] [phone]   07/09/2018, 3:25 PM

## 2018-07-09 NOTE — Patient Instructions (Signed)
Sciatica ° °Sciatica is pain, weakness, tingling, or loss of feeling (numbness) along the sciatic nerve. The sciatic nerve starts in the lower back and goes down the back of each leg. Sciatica usually goes away on its own or with treatment. Sometimes, sciatica may come back (recur). °What are the causes? °This condition happens when the sciatic nerve is pinched or has pressure put on it. This may be the result of: °· A disk in between the bones of the spine bulging out too far (herniated disk). °· Changes in the spinal disks that occur with aging. °· A condition that affects a muscle in the butt. °· Extra bone growth near the sciatic nerve. °· A break (fracture) of the area between your hip bones (pelvis). °· Pregnancy. °· Tumor. This is rare. °What increases the risk? °You are more likely to develop this condition if you: °· Play sports that put pressure or stress on the spine. °· Have poor strength and ease of movement (flexibility). °· Have had a back injury in the past. °· Have had back surgery. °· Sit for long periods of time. °· Do activities that involve bending or lifting over and over again. °· Are very overweight (obese). °What are the signs or symptoms? °Symptoms can vary from mild to very bad. They may include: °· Any of these problems in the lower back, leg, hip, or butt: °? Mild tingling, loss of feeling, or dull aches. °? Burning sensations. °? Sharp pains. °· Loss of feeling in the back of the calf or the sole of the foot. °· Leg weakness. °· Very bad back pain that makes it hard to move. °These symptoms may get worse when you cough, sneeze, or laugh. They may also get worse when you sit or stand for long periods of time. °How is this treated? °This condition often gets better without any treatment. However, treatment may include: °· Changing or cutting back on physical activity when you have pain. °· Doing exercises and stretching. °· Putting ice or heat on the affected area. °· Medicines that  help: °? To relieve pain and swelling. °? To relax your muscles. °· Shots (injections) of medicines that help to relieve pain, irritation, and swelling. °· Surgery. °Follow these instructions at home: °Medicines °· Take over-the-counter and prescription medicines only as told by your doctor. °· Ask your doctor if the medicine prescribed to you: °? Requires you to avoid driving or using heavy machinery. °? Can cause trouble pooping (constipation). You may need to take these steps to prevent or treat trouble pooping: °§ Drink enough fluids to keep your pee (urine) pale yellow. °§ Take over-the-counter or prescription medicines. °§ Eat foods that are high in fiber. These include beans, whole grains, and fresh fruits and vegetables. °§ Limit foods that are high in fat and sugar. These include fried or sweet foods. °Managing pain ° °  ° °· If told, put ice on the affected area. °? Put ice in a plastic bag. °? Place a towel between your skin and the bag. °? Leave the ice on for 20 minutes, 2-3 times a day. °· If told, put heat on the affected area. Use the heat source that your doctor tells you to use, such as a moist heat pack or a heating pad. °? Place a towel between your skin and the heat source. °? Leave the heat on for 20-30 minutes. °? Remove the heat if your skin turns bright red. This is very important if you are   unable to feel pain, heat, or cold. You may have a greater risk of getting burned. °Activity ° °· Return to your normal activities as told by your doctor. Ask your doctor what activities are safe for you. °· Avoid activities that make your symptoms worse. °· Take short rests during the day. °? When you rest for a long time, do some physical activity or stretching between periods of rest. °? Avoid sitting for a long time without moving. Get up and move around at least one time each hour. °· Exercise and stretch regularly, as told by your doctor. °· Do not lift anything that is heavier than 10 lb (4.5 kg)  while you have symptoms of sciatica. °? Avoid lifting heavy things even when you do not have symptoms. °? Avoid lifting heavy things over and over. °· When you lift objects, always lift in a way that is safe for your body. To do this, you should: °? Bend your knees. °? Keep the object close to your body. °? Avoid twisting. °General instructions °· Stay at a healthy weight. °· Wear comfortable shoes that support your feet. Avoid wearing high heels. °· Avoid sleeping on a mattress that is too soft or too hard. You might have less pain if you sleep on a mattress that is firm enough to support your back. °· Keep all follow-up visits as told by your doctor. This is important. °Contact a doctor if: °· You have pain that: °? Wakes you up when you are sleeping. °? Gets worse when you lie down. °? Is worse than the pain you have had in the past. °? Lasts longer than 4 weeks. °· You lose weight without trying. °Get help right away if: °· You cannot control when you pee (urinate) or poop (have a bowel movement). °· You have weakness in any of these areas and it gets worse: °? Lower back. °? The area between your hip bones. °? Butt. °? Legs. °· You have redness or swelling of your back. °· You have a burning feeling when you pee. °Summary °· Sciatica is pain, weakness, tingling, or loss of feeling (numbness) along the sciatic nerve. °· This condition happens when the sciatic nerve is pinched or has pressure put on it. °· Sciatica can cause pain, tingling, or loss of feeling (numbness) in the lower back, legs, hips, and butt. °· Treatment often includes rest, exercise, medicines, and putting ice or heat on the affected area. °This information is not intended to replace advice given to you by your health care provider. Make sure you discuss any questions you have with your health care provider. °Document Released: 10/06/2007 Document Revised: 01/15/2018 Document Reviewed: 01/15/2018 °Elsevier Patient Education © 2020 [employer]  [employer] ° °

## 2018-07-10 MED FILL — GABAPENTIN 300 MG CAPSULE: 300 | 30 days supply | Qty: 60 | Fill #0

## 2018-07-31 ENCOUNTER — Ambulatory Visit: Payer: BLUE CROSS/BLUE SHIELD | Admitting: Family Medicine

## 2018-09-04 MED FILL — LISINOPRIL 5 MG TAB: 5 | 30 days supply | Qty: 30 | Fill #3

## 2018-09-04 MED FILL — CARVEDILOL 12.5 MG TABLET: 12.5 | 30 days supply | Qty: 60 | Fill #0

## 2018-09-04 MED FILL — ATORVASTATIN 80 MG TABLET: 80 | 30 days supply | Qty: 30 | Fill #3

## 2018-09-04 MED FILL — SILDENAFIL CITRATE 50 MG TA: 50 | 30 days supply | Qty: 4 | Fill #5

## 2018-09-04 MED FILL — CLOPIDOGREL 75 MG TABLET: 75 | 30 days supply | Qty: 30 | Fill #3

## 2018-09-04 MED FILL — busPIRone HCL 10 MG TABS: 10 | 30 days supply | Qty: 60 | Fill #5

## 2018-09-04 MED FILL — GABAPENTIN 300 MG CAPSULE: 300 | 30 days supply | Qty: 60 | Fill #1

## 2018-10-11 MED FILL — ATORVASTATIN 80 MG TABLET: 80 | 30 days supply | Qty: 30 | Fill #4

## 2018-10-11 MED FILL — CHANTIX 1 MG CONT MONTH BOX: 1 | 30 days supply | Qty: 56 | Fill #3

## 2018-10-11 MED FILL — GABAPENTIN 300 MG CAPSULE: 300 | 30 days supply | Qty: 60 | Fill #2

## 2018-10-11 MED FILL — CARVEDILOL 12.5 MG TABLET: 12.5 | 30 days supply | Qty: 60 | Fill #1

## 2018-10-11 MED FILL — LISINOPRIL 5 MG TABLET: 5 | 30 days supply | Qty: 30 | Fill #4

## 2018-10-11 MED FILL — CLOPIDOGREL 75 MG TABLET: 75 | 30 days supply | Qty: 30 | Fill #4

## 2018-10-11 MED FILL — SILDENAFIL CITRATE 50 MG TA: 50 | 30 days supply | Qty: 4 | Fill #6

## 2018-10-11 MED FILL — busPIRone HCL 10 MG TABS: 10 | 30 days supply | Qty: 60 | Fill #0

## 2018-11-15 ENCOUNTER — Other Ambulatory Visit: Payer: Self-pay | Admitting: Pharmacist

## 2018-11-15 DIAGNOSIS — Z720 Tobacco use: Secondary | ICD-10-CM

## 2018-11-15 MED ORDER — VARENICLINE TARTRATE 1 MG PO TABS: 1.0000 mg | ORAL_TABLET | Freq: Two times a day (BID) | ORAL | 1 refills | Status: DC

## 2018-11-15 MED FILL — CARVEDILOL 12.5 MG TABLET: 12.5 | 30 days supply | Qty: 60 | Fill #2

## 2018-11-15 MED FILL — ATORVASTATIN 80 MG TABLET: 80 | 30 days supply | Qty: 30 | Fill #5

## 2018-11-15 MED FILL — SILDENAFIL CITRATE 50 MG TA: 50 | 30 days supply | Qty: 4 | Fill #7

## 2018-11-15 MED FILL — LISINOPRIL 5 MG TABLET: 5 | 30 days supply | Qty: 30 | Fill #5

## 2018-11-15 MED FILL — CLOPIDOGREL 75 MG TABLET: 75 | 30 days supply | Qty: 30 | Fill #5

## 2018-11-15 MED FILL — busPIRone HCL 10 MG TABS: 10 | 30 days supply | Qty: 60 | Fill #1

## 2018-11-16 MED FILL — CHANTIX 1 MG CONT MONTH BOX: 1 | 28 days supply | Qty: 56 | Fill #0

## 2018-11-23 ENCOUNTER — Telehealth: Payer: Self-pay | Admitting: *Deleted

## 2018-11-23 DIAGNOSIS — Z720 Tobacco use: Secondary | ICD-10-CM

## 2018-11-23 DIAGNOSIS — Z1211 Encounter for screening for malignant neoplasm of colon: Secondary | ICD-10-CM

## 2018-11-26 NOTE — Telephone Encounter (Signed)
Fecal occult ordered for patient.

## 2018-12-07 LAB — FECAL OCCULT BLOOD, IMMUNOCHEMICAL: Fecal Occult Bld: NEGATIVE

## 2018-12-12 ENCOUNTER — Telehealth: Payer: Self-pay | Admitting: Family Medicine

## 2018-12-12 NOTE — Telephone Encounter (Signed)
Patient was called and informed of lab results. 

## 2018-12-12 NOTE — Telephone Encounter (Signed)
Patient called for the results Fecal occult that was ordered

## 2018-12-25 ENCOUNTER — Other Ambulatory Visit: Payer: Self-pay | Admitting: Family Medicine

## 2018-12-25 DIAGNOSIS — I255 Ischemic cardiomyopathy: Secondary | ICD-10-CM

## 2018-12-25 MED FILL — SILDENAFIL CITRATE 50 MG TA: 50 | 30 days supply | Qty: 4 | Fill #8

## 2018-12-25 MED FILL — ATORVASTATIN 80 MG TABLET: 80 | 90 days supply | Qty: 90 | Fill #2

## 2018-12-25 MED FILL — CHANTIX 1 MG CONT MONTH BOX: 1 | 28 days supply | Qty: 56 | Fill #1

## 2018-12-25 MED FILL — CLOPIDOGREL 75 MG TABLET: 75 | 90 days supply | Qty: 90 | Fill #2

## 2018-12-25 MED FILL — LISINOPRIL 5 MG TABLET: 5 | 90 days supply | Qty: 90 | Fill #0

## 2019-01-09 MED FILL — LISINOPRIL 5 MG TABLET: 5 | 90 days supply | Qty: 90 | Fill #0

## 2019-01-09 MED FILL — ATORVASTATIN 80 MG TABLET: 80 | 90 days supply | Qty: 90 | Fill #2

## 2019-01-09 MED FILL — CLOPIDOGREL 75 MG TABLET: 75 | 90 days supply | Qty: 90 | Fill #0

## 2019-01-09 MED FILL — SILDENAFIL CITRATE 50 MG TA: 50 | 30 days supply | Qty: 4 | Fill #8

## 2019-02-07 MED FILL — busPIRone HCL 10 MG TABS: 10 | 30 days supply | Qty: 60 | Fill #2

## 2019-02-07 MED FILL — CARVEDILOL 12.5 MG TABLET: 12.5 | 30 days supply | Qty: 60 | Fill #3

## 2019-02-21 MED FILL — CHANTIX 1 MG CONT MONTH BOX: 1 | 28 days supply | Qty: 56 | Fill #1

## 2019-02-21 MED FILL — busPIRone HCL 10 MG TABS: 10 | 30 days supply | Qty: 60 | Fill #2

## 2019-02-21 MED FILL — CARVEDILOL 12.5 MG TABLET: 12.5 | 30 days supply | Qty: 60 | Fill #3

## 2019-03-04 NOTE — Progress Notes (Signed)
Cardiology Office Note   Date:  03/06/2019   ID:  Thomas Woods, DOB 1965/04/21, MRN [id number]  PCP:  Hoy Register, MD    No chief complaint on file.  CAD  Wt Readings from Last 3 Encounters:  03/06/19 208 lb (94.3 kg)  07/09/18 208 lb (94.3 kg)  03/01/18 209 lb 6.4 oz (95 kg)       History of Present Illness: Thomas Woods is a 54 y.o. male  Who had an anterior MI in 3/16.Angina was a burning sensation.He had 2 long overlapping drug-eluting stents to the LAD. His EF was low at 30%. He had an apical thrombus, and was treated with warfarin for a few months. EF came back to low normal in 6/16.  He has tried patches and pills to stop smoking.  He has been unsuccessful.  In late 2019, he had two eye surgeries for glaucoma in [place].  He was unable to exercise.  No lifting was allowed.    He has some DOE. It occurs occasionally.  He has some joint pains as well limiting his activity.    Since the last visit, he continues to have some joint pains.    He walks at work.  No problems with that activity.   Denies : Chest pain. Dizziness. Leg edema. Nitroglycerin use. Orthopnea. Palpitations. Paroxysmal nocturnal dyspnea. Shortness of breath. Syncope.     Past Medical History:  Diagnosis Date  . Hyperlipidemia LDL goal <70 03/27/2014  . Hypertension   . Ischemic cardiomyopathy 03/27/2014   EF 30% at cath, confirmed by echo  . Multiple lung nodules on CT 03/28/2014   Right lung nodules up to 6 mm, repeat CT in 6-12 months  . 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 NSTEMI (non-ST elevated myocardial infarction) (HCC) 03/26/2014   Patient pain increased and he developed ECG changes, taken to the cath lab as a STEMI, PCI to the LAD    Past Surgical History:  Procedure Laterality Date  . HERNIA REPAIR    . LEFT HEART CATHETERIZATION WITH CORONARY ANGIOGRAM N/A 03/27/2014   Procedure: LEFT HEART CATHETERIZATION WITH CORONARY  ANGIOGRAM;  Surgeon: Corky Crafts, MD; L main okay, LAD 100%, CFX okay, RI moderate disease, OM1 mild disease, RCA moderate disease, EF 30%  . PERCUTANEOUS CORONARY STENT INTERVENTION (PCI-S)  03/27/2014   3.0 x 32 Synergy overlapped with 2.5 x 38 Synergy to the mLAD     Current Outpatient Medications  Medication Sig Dispense Refill  . aspirin 81 MG tablet Take 1 tablet (81 mg total) by mouth daily. 30 tablet 3  . atorvastatin (LIPITOR) 80 MG tablet Take 1 tablet (80 mg total) by mouth daily. 90 tablet 1  . busPIRone (BUSPAR) 10 MG tablet Take 1 tablet (10 mg total) by mouth 2 (two) times daily. 180 tablet 1  . carvedilol (COREG) 12.5 MG tablet Take 1 tablet (12.5 mg total) by mouth 2 (two) times daily with a meal. 180 tablet 3  . clopidogrel (PLAVIX) 75 MG tablet Take 1 tablet (75 mg total) by mouth daily. 90 tablet 1  . gabapentin (NEURONTIN) 300 MG capsule Take 1 capsule (300 mg total) by mouth 2 (two) times daily. 60 capsule 3  . lisinopril (ZESTRIL) 5 MG tablet Take 1 tablet (5 mg total) by mouth daily. 90 tablet 1  . methocarbamol (ROBAXIN) 500 MG tablet Take 1 tablet (500 mg total) by mouth every 8 (eight) hours as needed  for muscle spasms. 90 tablet 1  . nitroGLYCERIN (NITROSTAT) 0.4 MG SL tablet Place 1 tablet (0.4 mg total) under the tongue every 5 (five) minutes as needed for chest pain (x 3 doses). 25 tablet 3  . prednisoLONE acetate (PRED FORTE) 1 % ophthalmic suspension Apply 1 drop to eye daily.    . sildenafil (VIAGRA) 50 MG tablet TAKE 1 TABLET BY MOUTH DAILY AS NEEDED FOR ERECTILE DYSFUNCTION 30 tablet 3  . varenicline (CHANTIX CONTINUING MONTH PAK) 1 MG tablet Take 1 tablet (1 mg total) by mouth 2 (two) times daily. 60 tablet 1   No current facility-administered medications for this visit.    Allergies:   Patient has no known allergies.    Social History:  The patient  reports that he has been smoking cigarettes. He has been smoking about 0.20 packs per day. He  has never used smokeless tobacco. He reports current alcohol use. He reports that he does not use drugs.   Family History:  The patient's family history includes Healthy in his brother, mother, and sister.    ROS:  Please see the history of present illness.   Otherwise, review of systems are positive for joint pains.   All other systems are reviewed and negative.    PHYSICAL EXAM: VS:  BP (!) 136/94   Pulse 76   Ht 6' (1.829 m)   Wt 208 lb (94.3 kg)   SpO2 93%   BMI 28.21 kg/m  , BMI Body mass index is 28.21 kg/m. GEN: Well nourished, well developed, in no acute distress  HEENT: normal  Neck: no JVD, carotid bruits, or masses Cardiac: RRR; no murmurs, rubs, or gallops,no edema  Respiratory:  clear to auscultation bilaterally, normal work of breathing GI: soft, nontender, nondistended, + BS MS: no deformity or atrophy  Skin: warm and dry, no rash Neuro:  Strength and sensation are intact Psych: euthymic mood, full affect   EKG:   The ekg ordered today demonstrates NSR, no ST changes   Recent Labs: No results found for requested labs within last 8760 hours.   Lipid Panel    Component Value Date/Time   CHOL 229 (H) 01/26/2018 1036   TRIG 75 01/26/2018 1036   HDL 75 01/26/2018 1036   CHOLHDL 3.1 01/26/2018 1036   CHOLHDL 2.1 12/07/2015 1053   VLDL 8 12/07/2015 1053   LDLCALC 139 (H) 01/26/2018 1036     Other studies Reviewed: Additional studies/ records that were reviewed today with results demonstrating: .   ASSESSMENT AND PLAN:  1. CAD/Old MI: No angina. Continue aggressive secondary prevention.  Trying for disability.  No sx like his MI.  2. Hyperlipidemia: LDL 139 in Jan 2020. Given joint pains, will change to Crestor 20 mg daily; check labs in 3 months.  See if joint pains improve. 3. HTN: The current medical regimen is effective;  continue present plan and medications. Slightly high today- Did not take meds this AM.  His Mom has a cuf that he can  use. 4. Tobacco abuse: He has cut back.  Chantix has helped.  5. Erectile dysfunction: Not using NTG.  Can call in sildenafil to [employer]   Current medicines are reviewed at length with the patient today.  The patient concerns regarding his medicines were addressed.  The following changes have been made:  Crestor  Labs/ tests ordered today include: lipids, CMet and lipids in 3 months No orders of the defined types were placed in this encounter.  Recommend 150 minutes/week of aerobic exercise Low fat, low carb, high fiber diet recommended  Disposition:   FU in 1 year   Signed, Larae Grooms, MD  03/06/2019 11:54 AM    [facility] [address], [place], [place]  [address] Phone: [phone]; Fax: [phone]

## 2019-03-06 ENCOUNTER — Encounter: Payer: Self-pay | Admitting: Interventional Cardiology

## 2019-03-06 ENCOUNTER — Other Ambulatory Visit: Payer: Self-pay

## 2019-03-06 ENCOUNTER — Ambulatory Visit (INDEPENDENT_AMBULATORY_CARE_PROVIDER_SITE_OTHER): Payer: 59 | Admitting: Interventional Cardiology

## 2019-03-06 VITALS — BP 136/94 | HR 76 | Ht 72.0 in | Wt 208.0 lb

## 2019-03-06 DIAGNOSIS — I252 Old myocardial infarction: Secondary | ICD-10-CM | POA: Diagnosis not present

## 2019-03-06 DIAGNOSIS — I10 Essential (primary) hypertension: Secondary | ICD-10-CM

## 2019-03-06 DIAGNOSIS — I25119 Atherosclerotic heart disease of native coronary artery with unspecified angina pectoris: Secondary | ICD-10-CM

## 2019-03-06 DIAGNOSIS — E782 Mixed hyperlipidemia: Secondary | ICD-10-CM | POA: Diagnosis not present

## 2019-03-06 DIAGNOSIS — Z720 Tobacco use: Secondary | ICD-10-CM

## 2019-03-06 MED ORDER — SILDENAFIL CITRATE 20 MG PO TABS: ORAL_TABLET | ORAL | 1 refills | Status: DC

## 2019-03-06 MED ORDER — ROSUVASTATIN CALCIUM 20 MG PO TABS: 20.0000 mg | ORAL_TABLET | Freq: Every day | ORAL | 3 refills | Status: DC

## 2019-03-06 MED FILL — ROSUVASTATIN CALCIUM 20 MG: 20 | 90 days supply | Qty: 90 | Fill #0

## 2019-03-06 NOTE — Patient Instructions (Signed)
Medication Instructions:  Your physician has recommended you make the following change in your medication:   1. STOP: atorvastatin (lipitor)  2. START: rosuvastatin (crestor) 20 mg tablet: Take 1 tablet by mouth once a day  3. START: sildenafil 20 mg tablet: Take 3-5 tablets AS NEEDED 1 hour prior to sexual activity  *If you need a refill on your cardiac medications before your next appointment, please call your pharmacy*  Lab Work: Your physician recommends that you return for a FASTING lipid profile and CMET on 06/04/19  If you have labs (blood work) drawn today and your tests are completely normal, you will receive your results only by: Marland Kitchen MyChart Message (if you have MyChart) OR . A paper copy in the mail If you have any lab test that is abnormal or we need to change your treatment, we will call you to review the results.  Testing/Procedures: None ordered  Follow-Up: At [facility], you and your health needs are our priority.  As part of our continuing mission to provide you with exceptional heart care, we have created designated Provider Care Teams.  These Care Teams include your primary Cardiologist (physician) and Advanced Practice Providers (APPs -  Physician Assistants and Nurse Practitioners) who all work together to provide you with the care you need, when you need it.  Your next appointment:   12 month(s)  The format for your next appointment:   In Person  Provider:   You may see Lance Muss, MD or one of the following Advanced Practice Providers on your designated Care Team:    Ronie Spies, PA-C  Jacolyn Reedy, PA-C   Other Instructions

## 2019-03-19 ENCOUNTER — Ambulatory Visit: Payer: 59 | Admitting: Family Medicine

## 2019-03-20 ENCOUNTER — Other Ambulatory Visit: Payer: Self-pay

## 2019-03-20 ENCOUNTER — Encounter: Payer: Self-pay | Admitting: Family Medicine

## 2019-03-20 ENCOUNTER — Ambulatory Visit: Payer: 59 | Attending: Family Medicine | Admitting: Family Medicine

## 2019-03-20 VITALS — BP 157/82 | HR 69 | Ht 72.0 in | Wt 210.0 lb

## 2019-03-20 DIAGNOSIS — R5383 Other fatigue: Secondary | ICD-10-CM

## 2019-03-20 DIAGNOSIS — F4323 Adjustment disorder with mixed anxiety and depressed mood: Secondary | ICD-10-CM

## 2019-03-20 DIAGNOSIS — I255 Ischemic cardiomyopathy: Secondary | ICD-10-CM

## 2019-03-20 DIAGNOSIS — I10 Essential (primary) hypertension: Secondary | ICD-10-CM

## 2019-03-20 DIAGNOSIS — M5431 Sciatica, right side: Secondary | ICD-10-CM

## 2019-03-20 DIAGNOSIS — F17210 Nicotine dependence, cigarettes, uncomplicated: Secondary | ICD-10-CM

## 2019-03-20 DIAGNOSIS — E782 Mixed hyperlipidemia: Secondary | ICD-10-CM

## 2019-03-20 DIAGNOSIS — Z720 Tobacco use: Secondary | ICD-10-CM

## 2019-03-20 MED ORDER — GABAPENTIN 300 MG PO CAPS: 300.0000 mg | ORAL_CAPSULE | Freq: Two times a day (BID) | ORAL | 3 refills | Status: DC

## 2019-03-20 MED ORDER — BUSPIRONE HCL 10 MG PO TABS: 10.0000 mg | ORAL_TABLET | Freq: Two times a day (BID) | ORAL | 1 refills | Status: DC

## 2019-03-20 MED ORDER — METHOCARBAMOL 500 MG PO TABS: 500.0000 mg | ORAL_TABLET | Freq: Three times a day (TID) | ORAL | 1 refills | Status: DC | PRN

## 2019-03-20 MED ORDER — LISINOPRIL 5 MG PO TABS: 5.0000 mg | ORAL_TABLET | Freq: Every day | ORAL | 1 refills | Status: DC

## 2019-03-20 MED FILL — METHOCARBAMOL 500 MG TABS: 500 | 30 days supply | Qty: 90 | Fill #0

## 2019-03-20 MED FILL — ROSUVASTATIN CALCIUM 20 MG: 20 | 90 days supply | Qty: 90 | Fill #0

## 2019-03-20 MED FILL — busPIRone HCL 10 MG TABS: 10 | 90 days supply | Qty: 180 | Fill #0

## 2019-03-20 MED FILL — GABAPENTIN 300 MG CAPSULE: 300 | 30 days supply | Qty: 60 | Fill #0

## 2019-03-20 NOTE — Progress Notes (Signed)
Patient has been having back spasm and numbness in right leg.

## 2019-03-20 NOTE — Patient Instructions (Signed)
Sciatica  Sciatica is pain, weakness, tingling, or loss of feeling (numbness) along the sciatic nerve. The sciatic nerve starts in the lower back and goes down the back of each leg. Sciatica usually goes away on its own or with treatment. Sometimes, sciatica may come back (recur). What are the causes? This condition happens when the sciatic nerve is pinched or has pressure put on it. This may be the result of:  A disk in between the bones of the spine bulging out too far (herniated disk).  Changes in the spinal disks that occur with aging.  A condition that affects a muscle in the butt.  Extra bone growth near the sciatic nerve.  A break (fracture) of the area between your hip bones (pelvis).  Pregnancy.  Tumor. This is rare. What increases the risk? You are more likely to develop this condition if you:  Play sports that put pressure or stress on the spine.  Have poor strength and ease of movement (flexibility).  Have had a back injury in the past.  Have had back surgery.  Sit for long periods of time.  Do activities that involve bending or lifting over and over again.  Are very overweight (obese). What are the signs or symptoms? Symptoms can vary from mild to very bad. They may include:  Any of these problems in the lower back, leg, hip, or butt: ? Mild tingling, loss of feeling, or dull aches. ? Burning sensations. ? Sharp pains.  Loss of feeling in the back of the calf or the sole of the foot.  Leg weakness.  Very bad back pain that makes it hard to move. These symptoms may get worse when you cough, sneeze, or laugh. They may also get worse when you sit or stand for long periods of time. How is this treated? This condition often gets better without any treatment. However, treatment may include:  Changing or cutting back on physical activity when you have pain.  Doing exercises and stretching.  Putting ice or heat on the affected area.  Medicines that  help: ? To relieve pain and swelling. ? To relax your muscles.  Shots (injections) of medicines that help to relieve pain, irritation, and swelling.  Surgery. Follow these instructions at home: Medicines  Take over-the-counter and prescription medicines only as told by your doctor.  Ask your doctor if the medicine prescribed to you: ? Requires you to avoid driving or using heavy machinery. ? Can cause trouble pooping (constipation). You may need to take these steps to prevent or treat trouble pooping:  Drink enough fluids to keep your pee (urine) pale yellow.  Take over-the-counter or prescription medicines.  Eat foods that are high in fiber. These include beans, whole grains, and fresh fruits and vegetables.  Limit foods that are high in fat and sugar. These include fried or sweet foods. Managing pain      If told, put ice on the affected area. ? Put ice in a plastic bag. ? Place a towel between your skin and the bag. ? Leave the ice on for 20 minutes, 2-3 times a day.  If told, put heat on the affected area. Use the heat source that your doctor tells you to use, such as a moist heat pack or a heating pad. ? Place a towel between your skin and the heat source. ? Leave the heat on for 20-30 minutes. ? Remove the heat if your skin turns bright red. This is very important if you are   unable to feel pain, heat, or cold. You may have a greater risk of getting burned. Activity   Return to your normal activities as told by your doctor. Ask your doctor what activities are safe for you.  Avoid activities that make your symptoms worse.  Take short rests during the day. ? When you rest for a long time, do some physical activity or stretching between periods of rest. ? Avoid sitting for a long time without moving. Get up and move around at least one time each hour.  Exercise and stretch regularly, as told by your doctor.  Do not lift anything that is heavier than 10 lb (4.5 kg)  while you have symptoms of sciatica. ? Avoid lifting heavy things even when you do not have symptoms. ? Avoid lifting heavy things over and over.  When you lift objects, always lift in a way that is safe for your body. To do this, you should: ? Bend your knees. ? Keep the object close to your body. ? Avoid twisting. General instructions  Stay at a healthy weight.  Wear comfortable shoes that support your feet. Avoid wearing high heels.  Avoid sleeping on a mattress that is too soft or too hard. You might have less pain if you sleep on a mattress that is firm enough to support your back.  Keep all follow-up visits as told by your doctor. This is important. Contact a doctor if:  You have pain that: ? Wakes you up when you are sleeping. ? Gets worse when you lie down. ? Is worse than the pain you have had in the past. ? Lasts longer than 4 weeks.  You lose weight without trying. Get help right away if:  You cannot control when you pee (urinate) or poop (have a bowel movement).  You have weakness in any of these areas and it gets worse: ? Lower back. ? The area between your hip bones. ? Butt. ? Legs.  You have redness or swelling of your back.  You have a burning feeling when you pee. Summary  Sciatica is pain, weakness, tingling, or loss of feeling (numbness) along the sciatic nerve.  This condition happens when the sciatic nerve is pinched or has pressure put on it.  Sciatica can cause pain, tingling, or loss of feeling (numbness) in the lower back, legs, hips, and butt.  Treatment often includes rest, exercise, medicines, and putting ice or heat on the affected area. This information is not intended to replace advice given to you by your health care provider. Make sure you discuss any questions you have with your health care provider. Document Revised: 01/15/2018 Document Reviewed: 01/15/2018 Elsevier Patient Education  2020 [employer]  

## 2019-03-20 NOTE — Progress Notes (Signed)
Established Patient Office Visit  Subjective:  Patient ID: Thomas Woods, male    DOB: 04-Oct-1965  Age: 54 y.o. MRN: [id number]  CC:  Chief Complaint  Patient presents with  . Hypertension    HPI [name] is a 54 year old male with a history of hypertension, hyperlipidemia, ischemic cardiomyopathy (EF 50-55% in 2016), s/p overlapping LAD stents, and anxiety who presents today for a follow up visit.  His blood pressure is elevated today, likely due to fasting and not taking his antihypertensives this morning.  Blood pressure was controlled at his last visit.  He has made improvements to his diet and is decreasing the amount of processes and fried foods that he eats.  Endorses compliance with medications.  His last cardiology visit was last month and they switched him from atorvastatin to rosuvastatin for suspected intolerance and he is starting the new medication today.   He reports occasional dyspnea on exertion, but denies dyspnea at rest, orthopnea, and chest pain. Anxiety symptoms are stable on current regimen.  Today he does have a complaint of worsening right sided sciatica that has been present for several years.  The pain is intermittent but is 10/10 at times, in his lower back and radiating down his right leg.  At his last visit he was prescribed gabapentin and methocarbamol which he only took for a few weeks and discontinued because he didn't see significant improvement and he was not able to afford them.  He has new insurance coverage and would be willing to try them again.  Currently he is taking ibuprofen twice daily with some improvement.  He also reports that he frequently feels fatigued throughout the day and would like to have that evaluated.  Past Medical History:  Diagnosis Date  . Hyperlipidemia LDL goal <70 03/27/2014  . Hypertension   . Ischemic cardiomyopathy 03/27/2014   EF 30% at cath, confirmed by echo  . Multiple lung nodules on CT 03/28/2014   Right lung nodules up  to 6 mm, repeat CT in 6-12 months  . 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 NSTEMI (non-ST elevated myocardial infarction) ([place]) 03/26/2014   Patient pain increased and he developed ECG changes, taken to the cath lab as a STEMI, PCI to the LAD    Past Surgical History:  Procedure Laterality Date  . HERNIA REPAIR    . LEFT HEART CATHETERIZATION WITH CORONARY ANGIOGRAM N/A 03/27/2014   Procedure: LEFT HEART CATHETERIZATION WITH CORONARY ANGIOGRAM;  Surgeon: Jettie Booze, MD; L main okay, LAD 100%, CFX okay, RI moderate disease, OM1 mild disease, RCA moderate disease, EF 30%  . PERCUTANEOUS CORONARY STENT INTERVENTION (PCI-S)  03/27/2014   3.0 x 32 Synergy overlapped with 2.5 x 38 Synergy to the mLAD    Family History  Problem Relation Age of Onset  . Healthy Mother   . Healthy Sister   . Healthy Brother   . Heart attack Neg Hx   . Hypertension Neg Hx   . Stroke Neg Hx     Social History   Socioeconomic History  . Marital status: Married    Spouse name: Not on file  . Number of children: Not on file  . Years of education: Not on file  . Highest education level: Not on file  Occupational History  . Not on file  Tobacco Use  . Smoking status: Light Tobacco Smoker    Packs/day: 0.20    Types: Cigarettes  . Smokeless  tobacco: Never Used  . Tobacco comment: 3 cigarettes a day   Substance and Sexual Activity  . Alcohol use: Yes    Alcohol/week: 0.0 standard drinks  . Drug use: No  . Sexual activity: Yes  Other Topics Concern  . Not on file  Social History Narrative  . Not on file   Social Determinants of Health   Financial Resource Strain:   . Difficulty of Paying Living Expenses: Not on file  Food Insecurity:   . Worried About Charity fundraiser in the Last Year: Not on file  . Ran Out of Food in the Last Year: Not on file  Transportation Needs:   . Lack of Transportation (Medical): Not on file  . Lack of  Transportation (Non-Medical): Not on file  Physical Activity:   . Days of Exercise per Week: Not on file  . Minutes of Exercise per Session: Not on file  Stress:   . Feeling of Stress : Not on file  Social Connections:   . Frequency of Communication with Friends and Family: Not on file  . Frequency of Social Gatherings with Friends and Family: Not on file  . Attends Religious Services: Not on file  . Active Member of Clubs or Organizations: Not on file  . Attends Archivist Meetings: Not on file  . Marital Status: Not on file  Intimate Partner Violence:   . Fear of Current or Ex-Partner: Not on file  . Emotionally Abused: Not on file  . Physically Abused: Not on file  . Sexually Abused: Not on file    Outpatient Medications Prior to Visit  Medication Sig Dispense Refill  . aspirin 81 MG tablet Take 1 tablet (81 mg total) by mouth daily. 30 tablet 3  . carvedilol (COREG) 12.5 MG tablet Take 1 tablet (12.5 mg total) by mouth 2 (two) times daily with a meal. 180 tablet 3  . clopidogrel (PLAVIX) 75 MG tablet Take 1 tablet (75 mg total) by mouth daily. 90 tablet 1  . prednisoLONE acetate (PRED FORTE) 1 % ophthalmic suspension Apply 1 drop to eye daily.    . rosuvastatin (CRESTOR) 20 MG tablet Take 1 tablet (20 mg total) by mouth daily. 90 tablet 3  . sildenafil (REVATIO) 20 MG tablet Take 3-5 tablets AS NEEDED 1 hour prior to sexual activity 30 tablet 1  . varenicline (CHANTIX CONTINUING MONTH PAK) 1 MG tablet Take 1 tablet (1 mg total) by mouth 2 (two) times daily. 60 tablet 1  . busPIRone (BUSPAR) 10 MG tablet Take 1 tablet (10 mg total) by mouth 2 (two) times daily. 180 tablet 1  . gabapentin (NEURONTIN) 300 MG capsule Take 1 capsule (300 mg total) by mouth 2 (two) times daily. 60 capsule 3  . lisinopril (ZESTRIL) 5 MG tablet Take 1 tablet (5 mg total) by mouth daily. 90 tablet 1  . methocarbamol (ROBAXIN) 500 MG tablet Take 1 tablet (500 mg total) by mouth every 8 (eight)  hours as needed for muscle spasms. 90 tablet 1  . nitroGLYCERIN (NITROSTAT) 0.4 MG SL tablet Place 1 tablet (0.4 mg total) under the tongue every 5 (five) minutes as needed for chest pain (x 3 doses). 25 tablet 3  . sildenafil (VIAGRA) 50 MG tablet TAKE 1 TABLET BY MOUTH DAILY AS NEEDED FOR ERECTILE DYSFUNCTION (Patient not taking: Reported on 03/20/2019) 30 tablet 3   No facility-administered medications prior to visit.    No Known Allergies  ROS Review of Systems  Constitutional:  Positive for fatigue. Negative for fever and unexpected weight change.  HENT: Negative for congestion, rhinorrhea, sinus pressure and sinus pain.   Eyes: Negative for visual disturbance.  Respiratory: Positive for shortness of breath. Negative for cough and chest tightness.   Cardiovascular: Negative for chest pain, palpitations and leg swelling.  Gastrointestinal: Negative for abdominal distention, abdominal pain, constipation, diarrhea, nausea and vomiting.  Endocrine: Negative for polydipsia and polyuria.  Genitourinary: Negative for decreased urine volume, difficulty urinating and dysuria.  Musculoskeletal: Positive for back pain and myalgias.       See HPI  Skin: Negative for color change and rash.  Neurological: Positive for numbness. Negative for dizziness, tremors and weakness.       See HPI  Hematological: Does not bruise/bleed easily.  Psychiatric/Behavioral: Negative for agitation and behavioral problems.      Objective:    Physical Exam  Constitutional: He is oriented to person, place, and time. He appears well-developed and well-nourished. No distress.  HENT:  Head: Normocephalic and atraumatic.  Eyes: Pupils are equal, round, and reactive to light. Conjunctivae and EOM are normal.  Neck: No JVD present.  Cardiovascular: Normal rate, regular rhythm, normal heart sounds and intact distal pulses.  Pulmonary/Chest: Effort normal and breath sounds normal. No respiratory distress. He has no  rales.  Abdominal: Soft. Bowel sounds are normal. He exhibits no distension. There is no abdominal tenderness.  Musculoskeletal:        General: No edema.     Lumbar back: Pain, spasms and tenderness present.     Comments: Positive straight leg raise on right  Neurological: He is alert and oriented to person, place, and time. A sensory deficit is present.  Right leg  Skin: Skin is warm and dry. No rash noted.  Psychiatric: He has a normal mood and affect. His behavior is normal.  Nursing note and vitals reviewed.   BP (!) 157/82   Pulse 69   Ht 6' (1.829 m)   Wt 210 lb (95.3 kg)   SpO2 100%   BMI 28.48 kg/m  Wt Readings from Last 3 Encounters:  03/20/19 210 lb (95.3 kg)  03/06/19 208 lb (94.3 kg)  07/09/18 208 lb (94.3 kg)     Health Maintenance Due  Topic Date Due  . TETANUS/TDAP  04/21/1985  . COLONOSCOPY  04/21/2016  . INFLUENZA VACCINE  08/11/2018    There are no preventive care reminders to display for this patient.  Lab Results  Component Value Date   TSH 0.769 03/27/2014   Lab Results  Component Value Date   WBC 6.0 04/02/2014   HGB 14.6 04/02/2014   HCT 43.0 04/02/2014   MCV 91.9 04/02/2014   PLT 201 04/02/2014   Lab Results  Component Value Date   NA 141 01/26/2018   K 4.3 01/26/2018   CO2 24 01/26/2018   GLUCOSE 88 01/26/2018   BUN 10 01/26/2018   CREATININE 1.00 01/26/2018   BILITOT 0.7 01/26/2018   ALKPHOS 48 01/26/2018   AST 18 01/26/2018   ALT 12 01/26/2018   PROT 7.5 01/26/2018   ALBUMIN 4.7 01/26/2018   CALCIUM 9.7 01/26/2018   ANIONGAP 6 03/28/2014   Lab Results  Component Value Date   CHOL 229 (H) 01/26/2018   Lab Results  Component Value Date   HDL 75 01/26/2018   Lab Results  Component Value Date   LDLCALC 139 (H) 01/26/2018   Lab Results  Component Value Date   TRIG 75 01/26/2018   Lab  Results  Component Value Date   CHOLHDL 3.1 01/26/2018   No results found for: HGBA1C    Assessment & Plan:   1.  Cardiomyopathy, ischemic Euvolemic EF 50-55% from Echo in 2016 - status post stent Continue ACEi and BB Keep follow up appointments with cardiology - lisinopril (ZESTRIL) 5 MG tablet; Take 1 tablet (5 mg total) by mouth daily.  Dispense: 90 tablet; Refill: 1 - CMP14+EGFR - Lipid panel  2. Adjustment disorder with mixed anxiety and depressed mood Stable - busPIRone (BUSPAR) 10 MG tablet; Take 1 tablet (10 mg total) by mouth 2 (two) times daily.  Dispense: 180 tablet; Refill: 1  3. Sciatica of right side Chronic - uncontrolled Resume gabapentin and methocarbamol Apply heat as needed for symptom relief Referral to physical therapy - gabapentin (NEURONTIN) 300 MG capsule; Take 1 capsule (300 mg total) by mouth 2 (two) times daily.  Dispense: 60 capsule; Refill: 3 - methocarbamol (ROBAXIN) 500 MG tablet; Take 1 tablet (500 mg total) by mouth every 8 (eight) hours as needed for muscle spasms.  Dispense: 90 tablet; Refill: 1 - Ambulatory referral to Physical Therapy  4. Other fatigue Lab work today - CBC with Differential/Platelet - TSH - T4, free - VITAMIN D 25 Hydroxy (Vit-D Deficiency, Fractures)  5. Essential hypertension Elevated - likely due to not taking medications this morning Continue current regimen Counseled on blood pressure goal of less than 130/80, low-sodium, DASH diet, medication compliance, 150 minutes of moderate intensity exercise per week. Discussed medication compliance, adverse effects.  6. Mixed hyperlipidemia Uncontrolled Lipid panel today Switched from atorvastatin to rosuvastatin by cardiology, just starting rosuvastatin today Encouraged medication compliance Low cholesterol diet  7. Tobacco abuse Currently smokes 2-3 cigarettes per day after meals Spent 3 minutes counseling on the risks of tobacco use and the benefits to quitting Continue Chantix   Meds ordered this encounter  Medications  . busPIRone (BUSPAR) 10 MG tablet    Sig: Take 1 tablet  (10 mg total) by mouth 2 (two) times daily.    Dispense:  180 tablet    Refill:  1  . gabapentin (NEURONTIN) 300 MG capsule    Sig: Take 1 capsule (300 mg total) by mouth 2 (two) times daily.    Dispense:  60 capsule    Refill:  3  . lisinopril (ZESTRIL) 5 MG tablet    Sig: Take 1 tablet (5 mg total) by mouth daily.    Dispense:  90 tablet    Refill:  1  . methocarbamol (ROBAXIN) 500 MG tablet    Sig: Take 1 tablet (500 mg total) by mouth every 8 (eight) hours as needed for muscle spasms.    Dispense:  90 tablet    Refill:  1    Follow-up: Return in about 6 months (around 09/20/2019) for Medical conditions.    Tomasita Morrow, RN   Evaluation and management procedures were performed by me with DNP Student in attendance, note written by DNP student under my supervision and collaboration. I have reviewed the note and I agree with the management and plan.   Mr. Cordaro is doing well from a cardiac standpoint currently asymptomatic but needs to work on risk factor modification including smoking cessation, blood pressure control.  No regimen changes made to his antihypertensive regimen given blood pressures have been previously controlled and he missed his morning dose today due to fasting in anticipation of labs.  He is working on smoking cessation and Chantix has been beneficial with  regards to cutting back. I will proceed with working up possible etiologies of his fatigue and for his sciatica he has been referred to PT and advised to resume gabapentin and his muscle relaxants.  If PT is ineffective consider up imaging down the road.  Charlott Rakes, MD, FAAFP. [facility], [place]   03/20/2019, 1:01 PM

## 2019-03-21 ENCOUNTER — Telehealth: Payer: Self-pay | Admitting: Interventional Cardiology

## 2019-03-21 DIAGNOSIS — I255 Ischemic cardiomyopathy: Secondary | ICD-10-CM

## 2019-03-21 LAB — CBC WITH DIFFERENTIAL/PLATELET
Basophils Absolute: 0 10*3/uL (ref 0.0–0.2)
Basos: 1 %
EOS (ABSOLUTE): 0.2 10*3/uL (ref 0.0–0.4)
Eos: 5 %
Hematocrit: 39.8 % (ref 37.5–51.0)
Hemoglobin: 13.6 g/dL (ref 13.0–17.7)
Immature Grans (Abs): 0 10*3/uL (ref 0.0–0.1)
Immature Granulocytes: 0 %
Lymphocytes Absolute: 2.3 10*3/uL (ref 0.7–3.1)
Lymphs: 51 %
MCH: 32.2 pg (ref 26.6–33.0)
MCHC: 34.2 g/dL (ref 31.5–35.7)
MCV: 94 fL (ref 79–97)
Monocytes Absolute: 0.5 10*3/uL (ref 0.1–0.9)
Monocytes: 12 %
Neutrophils Absolute: 1.4 10*3/uL (ref 1.4–7.0)
Neutrophils: 31 %
Platelets: 194 10*3/uL (ref 150–450)
RBC: 4.22 x10E6/uL (ref 4.14–5.80)
RDW: 13.2 % (ref 11.6–15.4)
WBC: 4.4 10*3/uL (ref 3.4–10.8)

## 2019-03-21 LAB — CMP14+EGFR
ALT: 29 IU/L (ref 0–44)
AST: 28 IU/L (ref 0–40)
Albumin/Globulin Ratio: 1.5 (ref 1.2–2.2)
Albumin: 4.4 g/dL (ref 3.8–4.9)
Alkaline Phosphatase: 50 IU/L (ref 39–117)
BUN/Creatinine Ratio: 14 (ref 9–20)
BUN: 12 mg/dL (ref 6–24)
Bilirubin Total: 0.8 mg/dL (ref 0.0–1.2)
CO2: 23 mmol/L (ref 20–29)
Calcium: 9.5 mg/dL (ref 8.7–10.2)
Chloride: 101 mmol/L (ref 96–106)
Creatinine, Ser: 0.85 mg/dL (ref 0.76–1.27)
GFR calc Af Amer: 116 mL/min/{1.73_m2} (ref >59)
GFR calc non Af Amer: 100 mL/min/{1.73_m2} (ref >59)
Globulin, Total: 3 g/dL (ref 1.5–4.5)
Glucose: 81 mg/dL (ref 65–99)
Potassium: 4.6 mmol/L (ref 3.5–5.2)
Sodium: 137 mmol/L (ref 134–144)
Total Protein: 7.4 g/dL (ref 6.0–8.5)

## 2019-03-21 LAB — TSH: TSH: 0.725 u[IU]/mL (ref 0.450–4.500)

## 2019-03-21 LAB — LIPID PANEL
Chol/HDL Ratio: 2.2 ratio (ref 0.0–5.0)
Cholesterol, Total: 166 mg/dL (ref 100–199)
HDL: 74 mg/dL (ref >39)
LDL Chol Calc (NIH): 82 mg/dL (ref 0–99)
Triglycerides: 47 mg/dL (ref 0–149)
VLDL Cholesterol Cal: 10 mg/dL (ref 5–40)

## 2019-03-21 LAB — VITAMIN D 25 HYDROXY (VIT D DEFICIENCY, FRACTURES): Vit D, 25-Hydroxy: 27.8 ng/mL — ABNORMAL LOW (ref 30.0–100.0)

## 2019-03-21 LAB — T4, FREE: Free T4: 1.24 ng/dL (ref 0.82–1.77)

## 2019-03-21 NOTE — Telephone Encounter (Signed)
   Pt c/o medication issue:  1. Name of Medication: lisinopril (ZESTRIL) 5 MG tablet  2. How are you currently taking this medication (dosage and times per day)? As written  3. Are you having a reaction (difficulty breathing--STAT)?   4. What is your medication issue? Pt is would like to ask DR. Eldridge Dace if he can give him an alternative med. He doesn't like to take lisinopril anymore.  Please advise

## 2019-03-21 NOTE — Telephone Encounter (Signed)
I spoke with patient who reports he has been having joint aches for awhile. He was told by a family member that lisinopril could be contributing to this and he is asking if lisinopril can be changed to another medication.  He just made change to Rosuvastatin yesterday.

## 2019-03-22 MED ORDER — LISINOPRIL 5 MG PO TABS: 5.0000 mg | ORAL_TABLET | Freq: Every day | ORAL | 3 refills | Status: DC

## 2019-03-22 NOTE — Telephone Encounter (Signed)
Called and made patient aware. He verbalized understanding and thanked me for the call.  

## 2019-03-22 NOTE — Telephone Encounter (Signed)
I would wait for a few weeks after changing to rosuvastatin before changing lisinopril.  I think that may help more with the muscle aches.   JV

## 2019-04-04 ENCOUNTER — Ambulatory Visit: Payer: 59 | Attending: Family Medicine

## 2019-04-17 ENCOUNTER — Other Ambulatory Visit: Payer: Self-pay | Admitting: Interventional Cardiology

## 2019-04-17 DIAGNOSIS — I255 Ischemic cardiomyopathy: Secondary | ICD-10-CM

## 2019-04-17 MED FILL — CARVEDILOL 12.5 MG TABLET: 12.5 | 90 days supply | Qty: 180 | Fill #0

## 2019-04-17 MED FILL — CLOPIDOGREL 75 MG TABLET: 75 | 90 days supply | Qty: 90 | Fill #0

## 2019-04-25 ENCOUNTER — Ambulatory Visit: Payer: 59 | Attending: Family Medicine | Admitting: Family Medicine

## 2019-04-25 ENCOUNTER — Other Ambulatory Visit: Payer: Self-pay

## 2019-04-25 DIAGNOSIS — G8929 Other chronic pain: Secondary | ICD-10-CM

## 2019-04-25 DIAGNOSIS — M5441 Lumbago with sciatica, right side: Secondary | ICD-10-CM

## 2019-04-25 DIAGNOSIS — Z720 Tobacco use: Secondary | ICD-10-CM

## 2019-04-25 MED ORDER — TRAMADOL HCL 50 MG PO TABS: 50.0000 mg | ORAL_TABLET | Freq: Two times a day (BID) | ORAL | 1 refills | Status: DC | PRN

## 2019-04-25 MED ORDER — VARENICLINE TARTRATE 1 MG PO TABS: 1.0000 mg | ORAL_TABLET | Freq: Two times a day (BID) | ORAL | 2 refills | Status: DC

## 2019-04-25 MED FILL — CHANTIX 1 MG CONT MONTH BOX: 1 | 28 days supply | Qty: 56 | Fill #0

## 2019-04-25 MED FILL — traMADol HCL 50 MG TABS: 50 | 7 days supply | Qty: 14 | Fill #0

## 2019-04-25 NOTE — Progress Notes (Signed)
Patient needs disability paperwork filled out. He would like to do MRI of back.

## 2019-04-25 NOTE — Progress Notes (Signed)
Virtual Visit via Telephone Note  I connected with Thomas Woods, on 04/25/2019 at 11:37 AM by telephone due to the COVID-19 pandemic and verified that I am speaking with the correct person using two identifiers.   Consent: I discussed the limitations, risks, security and privacy concerns of performing an evaluation and management service by telephone and the availability of in person appointments. I also discussed with the patient that there may be a patient responsible charge related to this service. The patient expressed understanding and agreed to proceed.   Location of Patient: Home  Location of Provider: Clinic   Persons participating in Telemedicine visit: Thomas Woods Dr. Margarita Rana     History of Present Illness:  He is a 54 year old male with a history of hypertension, hyperlipidemia, ischemic cardiomyopathy (EF 50-55% in 2016), s/p overlapping LAD stents, and anxiety who presents today for a follow up visit.   He presents today for completion of disability paperwork due to his lower back pain has been present for 6 months and has gotten worse; he is requesting a back MRI.  I have referred him to physical therapy last month however on calling the rehab center we discovered he no showed his appointment on 04/04/2019. He works 15 hours a week and is having back spasms which prevents him from working. 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 He takes his medications and 2 hours into his day symptoms start. He needs a doctors note as well to be out of work - states 2 months would be a starting point. X-ray scanned into the chart from [facility] from 07/2018 reveals degenerative and facet changes at L5-S1.  He is requesting refill of Chantix.  Past Medical History:  Diagnosis Date  . Hyperlipidemia LDL goal <70 03/27/2014  . Hypertension   . Ischemic cardiomyopathy 03/27/2014   EF 30% at cath, confirmed  by echo  . Multiple lung nodules on CT 03/28/2014   Right lung nodules up to 6 mm, repeat CT in 6-12 months  . 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 NSTEMI (non-ST elevated myocardial infarction) ([place]) 03/26/2014   Patient pain increased and he developed ECG changes, taken to the cath lab as a STEMI, PCI to the LAD   No Known Allergies  Current Outpatient Medications on File Prior to Visit  Medication Sig Dispense Refill  . aspirin 81 MG tablet Take 1 tablet (81 mg total) by mouth daily. 30 tablet 3  . busPIRone (BUSPAR) 10 MG tablet Take 1 tablet (10 mg total) by mouth 2 (two) times daily. 180 tablet 1  . carvedilol (COREG) 12.5 MG tablet TAKE 1 TABLET (12.5 MG TOTAL) BY MOUTH 2 (TWO) TIMES DAILY WITH A MEAL. 180 tablet 2  . clopidogrel (PLAVIX) 75 MG tablet TAKE 1 TABLET (75 MG TOTAL) BY MOUTH DAILY. 90 tablet 2  . gabapentin (NEURONTIN) 300 MG capsule Take 1 capsule (300 mg total) by mouth 2 (two) times daily. 60 capsule 3  . lisinopril (ZESTRIL) 5 MG tablet Take 1 tablet (5 mg total) by mouth daily. 90 tablet 3  . methocarbamol (ROBAXIN) 500 MG tablet Take 1 tablet (500 mg total) by mouth every 8 (eight) hours as needed for muscle spasms. 90 tablet 1  . prednisoLONE acetate (PRED FORTE) 1 % ophthalmic suspension Apply 1 drop to eye daily.    . rosuvastatin (  CRESTOR) 20 MG tablet Take 1 tablet (20 mg total) by mouth daily. 90 tablet 3  . sildenafil (REVATIO) 20 MG tablet Take 3-5 tablets AS NEEDED 1 hour prior to sexual activity 30 tablet 1  . nitroGLYCERIN (NITROSTAT) 0.4 MG SL tablet Place 1 tablet (0.4 mg total) under the tongue every 5 (five) minutes as needed for chest pain (x 3 doses). 25 tablet 3  . sildenafil (VIAGRA) 50 MG tablet TAKE 1 TABLET BY MOUTH DAILY AS NEEDED FOR ERECTILE DYSFUNCTION (Patient not taking: Reported on 03/20/2019) 30 tablet 3  . varenicline (CHANTIX CONTINUING MONTH PAK) 1 MG tablet Take 1 tablet (1 mg total) by mouth 2  (two) times daily. (Patient not taking: Reported on 04/25/2019) 60 tablet 1   No current facility-administered medications on file prior to visit.    Observations/Objective: Awake, alert, oriented x3 Not in acute distress  Assessment and Plan: 1. Tobacco abuse Chantix refill - varenicline (CHANTIX CONTINUING MONTH PAK) 1 MG tablet; Take 1 tablet (1 mg total) by mouth 2 (two) times daily.  Dispense: 60 tablet; Refill: 2  2. Chronic right-sided low back pain with right-sided sciatica Uncontrolled; underlying degenerative changes We will assist in rescheduling his appointment I have completed his disability paperwork and provided him a note for work to be out for 2 months I have explained to him guidelines for back imaging-at least 6 weeks for x-ray. We will commence with physical therapy if uncontrolled consider MRI down the road At this time no indication for MRI. Tramadol added - traMADol (ULTRAM) 50 MG tablet; Take 1 tablet (50 mg total) by mouth every 12 (twelve) hours as needed.  Dispense: 60 tablet; Refill: 1   Follow Up Instructions: Return in about 2 months (around 06/25/2019) for back pain.    I discussed the assessment and treatment plan with the patient. The patient was provided an opportunity to ask questions and all were answered. The patient agreed with the plan and demonstrated an understanding of the instructions.   The patient was advised to call back or seek an in-person evaluation if the symptoms worsen or if the condition fails to improve as anticipated.     I provided 25 minutes total of non-face-to-face time during this encounter including median intraservice time, reviewing previous notes, investigations, ordering medications, medical decision making, coordinating care and patient verbalized understanding at the end of the visit.     Hoy Register, MD, FAAFP. [facility] [place], [place] [phone]   04/25/2019,  11:37 AM

## 2019-04-30 MED FILL — GABAPENTIN 300 MG CAPSULE: 300 | 30 days supply | Qty: 60 | Fill #1

## 2019-05-13 ENCOUNTER — Ambulatory Visit: Payer: 59 | Attending: Family Medicine | Admitting: Physical Therapy

## 2019-05-13 ENCOUNTER — Other Ambulatory Visit: Payer: Self-pay

## 2019-05-13 ENCOUNTER — Encounter: Payer: Self-pay | Admitting: Physical Therapy

## 2019-05-13 DIAGNOSIS — G8929 Other chronic pain: Secondary | ICD-10-CM | POA: Insufficient documentation

## 2019-05-13 DIAGNOSIS — M545 Low back pain: Secondary | ICD-10-CM | POA: Insufficient documentation

## 2019-05-13 DIAGNOSIS — M6281 Muscle weakness (generalized): Secondary | ICD-10-CM | POA: Insufficient documentation

## 2019-05-13 NOTE — Patient Instructions (Signed)
Access Code: [email or web address] URL: [email or web address] Date: 05/13/2019 Prepared by: Rosana Hoes  Exercises Left Standing Lateral Shift Correction at Wall - Repetitions - 10-15 reps - 3-5 seconds hold Standing Lumbar Extension at Wall - Forearms - 10-15 reps - 3-5 seconds hold Supine Lower Trunk Rotation - 10 reps - 3-5 seconds hold

## 2019-05-14 NOTE — Therapy (Signed)
[facility] [address] [place], [place], [address] Phone: [phone]   Fax:  [phone]  Physical Therapy Evaluation  Patient Details  Name: Thomas Woods MRN: [id number] Date of Birth: Nov 08, 1965 Referring Provider (PT): Hoy RegisterNewlin, Enobong, MD   Encounter Date: 05/13/2019  PT End of Session - 05/13/19 1410    Visit Number  1    Number of Visits  8    Date for PT Re-Evaluation  07/08/19    Authorization Type  [facility] [facility]    PT Start Time  1400    PT Stop Time  1445    PT Time Calculation (min)  45 min    Activity Tolerance  Patient tolerated treatment well    Behavior During Therapy  [id number] for tasks assessed/performed       Past Medical History:  Diagnosis Date  . Hyperlipidemia LDL goal <70 03/27/2014  . Hypertension   . Ischemic cardiomyopathy 03/27/2014   EF 30% at cath, confirmed by echo  . Multiple lung nodules on CT 03/28/2014   Right lung nodules up to 6 mm, repeat CT in 6-12 months  . 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 NSTEMI (non-ST elevated myocardial infarction) ([facility]) 03/26/2014   Patient pain increased and he developed ECG changes, taken to the cath lab as a STEMI, PCI to the LAD    Past Surgical History:  Procedure Laterality Date  . HERNIA REPAIR    . LEFT HEART CATHETERIZATION WITH CORONARY ANGIOGRAM N/A 03/27/2014   Procedure: LEFT HEART CATHETERIZATION WITH CORONARY ANGIOGRAM;  Surgeon: Corky CraftsJayadeep S Varanasi, MD; L main okay, LAD 100%, CFX okay, RI moderate disease, OM1 mild disease, RCA moderate disease, EF 30%  . PERCUTANEOUS CORONARY STENT INTERVENTION (PCI-S)  03/27/2014   3.0 x 32 Synergy overlapped with 2.5 x 38 Synergy to the mLAD    There were no vitals filed for this visit.   Subjective Assessment - 05/13/19 1401    Subjective  Patient reports back spasms and lower back pain on the right side. 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 States that the back spasms cause him to take a while to get out of bed and he is unable to sit for a while because he will get stiff. The spasms occur usually twice a day. He also occasionally gets numbness in right leg around every other day. He denies a mechanism of injury and states he just wants to know what is going on, also reports he wants an MRI and needs therapy first before MRI is approved.    Pertinent History  HTN, MI in 2016, anxiety    Limitations  Sitting;Lifting;Standing;Walking;House hold activities    How long can you sit comfortably?  30 minutes    How long can you stand comfortably?  30 minutes    How long can you walk comfortably?  30 minutes    Diagnostic tests  X-ray    Patient Stated Goals  Improve pain and movement so he can return to work.    Currently in Pain?  Yes    Pain Score  5    worse pain 10/10   Pain Location  Back    Pain Orientation  Right;Lower    Pain Descriptors / Indicators  Spasm;Aching   "just a back pain"   Pain Type  Chronic pain    Pain Radiating Towards  occasional numbness in right leg    Pain Onset  More than a month ago    Pain Frequency  Intermittent    Aggravating Factors   Sitting or standing extended periods, lifting    Pain Relieving Factors  Medication    Effect of Pain on Daily Activities  Patient is unable to work right now due to pain         [facility] PT Assessment - 05/14/19 0001      Assessment   Medical Diagnosis  Lower back pain    Referring Provider (PT)  Charlott Rakes, MD    Onset Date/Surgical Date  --   approximately 1 year   Hand Dominance  Right    Next MD Visit  Not scheduled    Prior Therapy  None      Precautions   Precautions  None      Restrictions   Weight Bearing Restrictions  No      Balance Screen   Has the patient fallen in the past 6 months  No    Has the patient had a decrease in activity level because of a fear of  falling?   No    Is the patient reluctant to leave their home because of a fear of falling?   No      Prior Function   Level of Independence  Independent    Vocation  Full time employment    Vocation Requirements  Works at [employer]    Leisure  None reported      Cognition   Overall Cognitive Status  Within Functional Limits for tasks assessed      Observation/Other Assessments   Observations  Patient appears in no apparent distress    Focus on Therapeutic Outcomes (FOTO)   68% limitation      Sensation   Light Touch  Appears Intact      Posture/Postural Control   Posture Comments  Patient exhibits a left lateral shift of the trunk, decreased lumbar lordosis, rounded shoulder posture      ROM / Strength   AROM / PROM / Strength  AROM;PROM;Strength      AROM   Overall AROM Comments  Increased pain with all movements, peripheralization noted with flexion    AROM Assessment Site  Lumbar    Lumbar Flexion  50%    Lumbar Extension  50%    Lumbar - Right Side Bend  75%    Lumbar - Left Side Bend  50%    Lumbar - Right Rotation  75%    Lumbar - Left Rotation  75%      PROM   Overall PROM Comments  Hip PROM grossly limited in all movements due to patient guarding and report of increased lower back pain      Strength   Strength Assessment Site  Hip;Knee;Ankle    Right/Left Hip  Right;Left    Right Hip Flexion  4/5    Right Hip Extension  3+/5    Right Hip ABduction  3+/5    Left Hip Extension  4+/5    Left Hip External Rotation  4-/5    Left Hip ABduction  3+/5    Right/Left Knee  Right;Left    Right Knee Flexion  4-/5    Right Knee Extension  4/5    Left Knee Flexion  4+/5    Left Knee Extension  5/5    Right/Left  Ankle  Right;Left    Right Ankle Dorsiflexion  4+/5    Right Ankle Plantar Flexion  4/5    Right Ankle Inversion  4+/5    Right Ankle Eversion  4+/5    Left Ankle Dorsiflexion  5/5    Left Ankle Plantar Flexion  5/5    Left Ankle  Inversion  5/5    Left Ankle Eversion  5/5      Flexibility   Soft Tissue Assessment /Muscle Length  yes    Hamstrings  Limited greater on right with increased hip/thigh and lower back pain      Palpation   Spinal mobility  Unable to assess due to patient's pain level    SI assessment   Not assessed    Palpation comment  Patient grossly TTP right lower back      Special Tests    Special Tests  Lumbar    Lumbar Tests  Slump Test;Straight Leg Raise      Slump test   Findings  Positive      Straight Leg Raise   Findings  Positive      Transfers   Transfers  Independent with all Transfers      Ambulation/Gait   Ambulation/Gait  Yes    Ambulation/Gait Assistance  7: Independent    Gait Comments  Slightly antalgic on right, decreased gait speed                Objective measurements completed on examination: See above findings.      [facility] Adult PT Treatment/Exercise - 05/14/19 0001      Exercises   Exercises  Lumbar      Lumbar Exercises: Stretches   Lower Trunk Rotation Limitations  3 sec hold x10      Lumbar Exercises: Standing   Other Standing Lumbar Exercises  Left lateral shift correction against wall 2x10 through comfortable range    Other Standing Lumbar Exercises  Lumbar extension with forearms on wall x10             PT Education - 05/13/19 1410    Education Details  Exam findings, POC, HEP, directional preference    Person(s) Educated  Patient    Methods  Explanation;Demonstration;Tactile cues;Verbal cues;Handout    Comprehension  Verbalized understanding;Returned demonstration;Verbal cues required;Tactile cues required;Need further instruction       PT Short Term Goals - 05/14/19 0811      PT SHORT TERM GOAL #1   Title  Patient will be I with initial HEP to progress with PT    Time  4    Period  Weeks    Status  New    Target Date  06/10/19      PT SHORT TERM GOAL #2   Title  Patient will exhibit proper posture without lateral  shift toward left    Time  4    Period  Weeks    Status  New    Target Date  06/10/19      PT SHORT TERM GOAL #3   Title  Patient will report improved pain with activity to </= 5/10 to improve walking and standing ability    Time  4    Period  Weeks    Status  New    Target Date  06/10/19        PT Long Term Goals - 05/14/19 0813      PT LONG TERM GOAL #1   Title  Patient will be I with  final HEP to maintain progress from PT    Time  8    Period  Weeks    Status  New    Target Date  07/08/19      PT LONG TERM GOAL #2   Title  Patient will report improved functional ability to </= 46% limitation on FOTO    Time  8    Period  Weeks    Status  New    Target Date  07/08/19      PT LONG TERM GOAL #3   Title  Patient will exhibit lumbar AROM grossly WFL to allow for improved bed mobility and ability to perform household tasks    Time  8    Period  Weeks    Status  New    Target Date  07/08/19      PT LONG TERM GOAL #4   Title  Patient will exhibit >/= 4+/5 core and hip strength to allow for proper lifting mechanics and return to work without limitation    Time  8    Period  Weeks    Status  New    Target Date  07/08/19      PT LONG TERM GOAL #5   Title  Patient will be able to walk and stand >/= 1 hour with </= 3/10 pain in order to improve community activity and return to work    Time  8    Period  Weeks    Status  New    Target Date  07/08/19             Plan - 05/14/19 0801    Clinical Impression Statement  Patient presents to PT with report of right sided low back pain and spasms with occasional radiation of pain/numbness down posterior/lateral aspect of right leg. Upon examination patient exhibits a left lateral shift and peripheralization with flexion based movement. He does also demonstrate positive right sided radicular symptoms with RLE weakness and core weakness. The examination was limited secondary to patient's pain which did seem at times out of  proportion to tasks assessed. He was provided with exercises to work on lateral shift and lumbar extension. He would benefit from continued skilled PT to improve his low back and right left symptoms and address listed impairments to allow him to improve functional mobility and return to work without limitation.    Personal Factors and Comorbidities  Fitness;Past/Current Experience;Time since onset of injury/illness/exacerbation;Transportation;Finances;Comorbidity 3+    Comorbidities  HTN, MI in 2016, anxiety    Examination-Activity Limitations  Locomotion Level;Sit;Sleep;Squat;Stairs;Stand;Lift;Carry;Bed Mobility;Bend    Examination-Participation Restrictions  Meal Prep;Cleaning;Community Activity;Shop;Laundry;Yard Work    Conservation officer, historic buildings  Evolving/Moderate complexity    Clinical Decision Making  Moderate    Rehab Potential  Good    PT Frequency  1x / week    PT Duration  8 weeks    PT Treatment/Interventions  ADLs/Self Care Home Management;Cryotherapy;Electrical Stimulation;Moist Heat;Traction;Gait training;Stair training;Functional mobility training;Therapeutic activities;Therapeutic exercise;Balance training;Neuromuscular re-education;Manual techniques;Patient/family education;Passive range of motion;Dry needling;Joint Manipulations;Spinal Manipulations;Taping    PT Next Visit Plan  Assess response to lateral shift correction and lumbar extension, if continues to have positive response with direction preference continue with this; hip stretching, hip and core strengthening    PT Home Exercise Plan  [email or web address]: standing left lateral shift correction, standing lumbar extension with forearms on wall, supine LTR    Consulted and Agree with Plan of Care  Patient       Patient will benefit  from skilled therapeutic intervention in order to improve the following deficits and impairments:  Abnormal gait, Decreased range of motion, Difficulty walking, Increased muscle spasms,  Cardiopulmonary status limiting activity, Pain, Decreased activity tolerance, Impaired flexibility, Improper body mechanics, Postural dysfunction, Decreased strength  Visit Diagnosis: Chronic right-sided low back pain, unspecified whether sciatica present  Muscle weakness (generalized)     Problem List Patient Active Problem List   Diagnosis Date Noted  . Smoking addiction 08/03/2016  . Adjustment disorder with mixed anxiety and depressed mood 08/03/2016  . Essential hypertension 08/24/2015  . Old MI (myocardial infarction) 08/24/2015  . CAD (coronary artery disease) 02/23/2015  . Lung nodule 10/20/2014  . Cardiomyopathy, ischemic 10/20/2014  . Erectile dysfunction 05/29/2014  . Hyperlipidemia 05/29/2014  . Follow up 05/13/2014  . Cigarette nicotine dependence with withdrawal 05/13/2014  . Encounter for therapeutic drug monitoring 04/04/2014  . LV (left ventricular) mural thrombus 03/29/2014  . Tobacco abuse 03/27/2014  . Acute anterior wall MI ([facility]) 03/27/2014  . NSTEMI (non-ST elevated myocardial infarction) ([facility]) 03/27/2014    Rosana Hoes, PT, DPT, LAT, ATC 05/14/19  8:27 AM Phone: [phone] Fax: [phone]   [facility] [address] [place], [place], [address] Phone: [phone]   Fax:  [phone]  Name: Thomas Woods MRN: [id number] Date of Birth: 1965/08/01

## 2019-05-15 MED FILL — GABAPENTIN 300 MG CAPSULE: 300 | 30 days supply | Qty: 60 | Fill #1

## 2019-05-15 MED FILL — METHOCARBAMOL 500 MG TABS: 500 | 30 days supply | Qty: 90 | Fill #1

## 2019-05-15 MED FILL — traMADol HCL 50 MG TABS: 50 | 7 days supply | Qty: 14 | Fill #1

## 2019-05-23 ENCOUNTER — Encounter: Payer: Self-pay | Admitting: Physical Therapy

## 2019-05-23 ENCOUNTER — Ambulatory Visit: Payer: 59 | Admitting: Physical Therapy

## 2019-05-23 ENCOUNTER — Other Ambulatory Visit: Payer: Self-pay

## 2019-05-23 DIAGNOSIS — M6281 Muscle weakness (generalized): Secondary | ICD-10-CM

## 2019-05-23 DIAGNOSIS — M545 Low back pain, unspecified: Secondary | ICD-10-CM

## 2019-05-23 NOTE — Therapy (Signed)
[place], [place], [address] Phone: [phone]   Fax:  [phone]  Physical Therapy Treatment  Patient Details  Name: Thomas Woods MRN: [id number] Date of Birth: 1965/02/04 Referring Provider (PT): Charlott Rakes, MD   Encounter Date: 05/23/2019  PT End of Session - 05/23/19 1000    Visit Number  2    Number of Visits  8    Date for PT Re-Evaluation  07/08/19    Authorization Type  [employer]    PT Start Time  1001    PT Stop Time  1059    PT Time Calculation (min)  58 min    Activity Tolerance  Patient tolerated treatment well    Behavior During Therapy  [facility] for tasks assessed/performed       Past Medical History:  Diagnosis Date  . Hyperlipidemia LDL goal <70 03/27/2014  . Hypertension   . Ischemic cardiomyopathy 03/27/2014   EF 30% at cath, confirmed by echo  . Multiple lung nodules on CT 03/28/2014   Right lung nodules up to 6 mm, repeat CT in 6-12 months  . Mural thrombus of cardiac apex following MI ([place]) 03/27/2014   On Coumadin  . NSTEMI (non-ST elevated myocardial infarction) ([place]) 03/26/2014   Patient pain increased and he developed ECG changes, taken to the cath lab as a STEMI, PCI to the LAD    Past Surgical History:  Procedure Laterality Date  . HERNIA REPAIR    . LEFT HEART CATHETERIZATION WITH CORONARY ANGIOGRAM N/A 03/27/2014   Procedure: LEFT HEART CATHETERIZATION WITH CORONARY ANGIOGRAM;  Surgeon: Jettie Booze, MD; L main okay, LAD 100%, CFX okay, RI moderate disease, OM1 mild disease, RCA moderate disease, EF 30%  . PERCUTANEOUS CORONARY STENT INTERVENTION (PCI-S)  03/27/2014   3.0 x 32 Synergy overlapped with 2.5 x 38 Synergy to the mLAD    There were no vitals filed for this visit.  Subjective Assessment - 05/23/19 1001    Subjective  Pt reports he didnt' take his medicaiton today because he didnt' eat.  He is sore first thing in the morning after sleeping all  night.    Patient Stated Goals  Improve pain and movement so he can return to work.    Currently in Pain?  Yes    Pain Score  5     Pain Location  Back    Pain Orientation  Right;Lower    Pain Descriptors / Indicators  Aching;Spasm    Pain Type  Chronic pain    Pain Radiating Towards  1-2 x/day pain into leg    Pain Onset  More than a month ago    Pain Frequency  Intermittent    Aggravating Factors   first thing getting out of bed, transitioning positions, bending FWD    Pain Relieving Factors  moving around and the exercise                        [facility] Adult PT Treatment/Exercise - 05/23/19 0001      Self-Care   Self-Care  Other Self-Care Comments    Other Self-Care Comments   safe bending - keeping a straight back to picking items up off floor, golfers lift  , self TPR with ball on wall    these activities aggrivate his back - golfers lift is the be     Lumbar Exercises: Aerobic   Nustep  L3x6'      Lumbar  Exercises: Prone   Other Prone Lumbar Exercises  POE, VC to keep hips level,  then 10 mini press ups       Lumbar Exercises: Quadruped   Madcat/Old Horse  10 reps      Modalities   Modalities  Moist Heat      Moist Heat Therapy   Number Minutes Moist Heat  12 Minutes    Moist Heat Location  Lumbar Spine   thoracic      Manual Therapy   Manual Therapy  Soft tissue mobilization    Soft tissue mobilization  STM and TPR to R tlumbar and thoracic paraspinals, with rolling to release the muscles.              PT Education - 05/23/19 1044    Education Details  HEP progression and self TPR, home TENS    Person(s) Educated  Patient    Methods  Explanation;Demonstration;Handout    Comprehension  Returned demonstration       PT Short Term Goals - 05/14/19 0811      PT SHORT TERM GOAL #1   Title  Patient will be I with initial HEP to progress with PT    Time  4    Period  Weeks    Status  New    Target Date  06/10/19      PT SHORT TERM GOAL  #2   Title  Patient will exhibit proper posture without lateral shift toward left    Time  4    Period  Weeks    Status  New    Target Date  06/10/19      PT SHORT TERM GOAL #3   Title  Patient will report improved pain with activity to </= 5/10 to improve walking and standing ability    Time  4    Period  Weeks    Status  New    Target Date  06/10/19        PT Long Term Goals - 05/14/19 0813      PT LONG TERM GOAL #1   Title  Patient will be I with final HEP to maintain progress from PT    Time  8    Period  Weeks    Status  New    Target Date  07/08/19      PT LONG TERM GOAL #2   Title  Patient will report improved functional ability to </= 46% limitation on FOTO    Time  8    Period  Weeks    Status  New    Target Date  07/08/19      PT LONG TERM GOAL #3   Title  Patient will exhibit lumbar AROM grossly WFL to allow for improved bed mobility and ability to perform household tasks    Time  8    Period  Weeks    Status  New    Target Date  07/08/19      PT LONG TERM GOAL #4   Title  Patient will exhibit >/= 4+/5 core and hip strength to allow for proper lifting mechanics and return to work without limitation    Time  8    Period  Weeks    Status  New    Target Date  07/08/19      PT LONG TERM GOAL #5   Title  Patient will be able to walk and stand >/= 1 hour with </= 3/10 pain in order to  improve community activity and return to work    Time  8    Period  Weeks    Status  New    Target Date  07/08/19            Plan - 05/23/19 1158    Clinical Impression Statement  Pt presents with less of a lateral shift today, still having relief with extension exercise.  He has a lot of banding and tightness in the Rt lumbar and thoracic paraspinals. Responded well to manual work to decrease the tightness and instruction in TPR with a ball on the wall. May need some DN if banding persists.    Rehab Potential  Good    PT Frequency  1x / week    PT Duration  8 weeks     PT Treatment/Interventions  ADLs/Self Care Home Management;Cryotherapy;Electrical Stimulation;Moist Heat;Traction;Gait training;Stair training;Functional mobility training;Therapeutic activities;Therapeutic exercise;Balance training;Neuromuscular re-education;Manual techniques;Patient/family education;Passive range of motion;Dry needling;Joint Manipulations;Spinal Manipulations;Taping    PT Next Visit Plan  manual work to tightness in paraspinals if needed, core stability - progress to dead lifts if able to bend without pain in preparation for return to work    [employer] and Agree with Plan of Care  Patient       Patient will benefit from skilled therapeutic intervention in order to improve the following deficits and impairments:  Abnormal gait, Decreased range of motion, Difficulty walking, Increased muscle spasms, Cardiopulmonary status limiting activity, Pain, Decreased activity tolerance, Impaired flexibility, Improper body mechanics, Postural dysfunction, Decreased strength  Visit Diagnosis: Chronic right-sided low back pain, unspecified whether sciatica present  Muscle weakness (generalized)     Problem List Patient Active Problem List   Diagnosis Date Noted  . Smoking addiction 08/03/2016  . Adjustment disorder with mixed anxiety and depressed mood 08/03/2016  . Essential hypertension 08/24/2015  . Old MI (myocardial infarction) 08/24/2015  . CAD (coronary artery disease) 02/23/2015  . Lung nodule 10/20/2014  . Cardiomyopathy, ischemic 10/20/2014  . Erectile dysfunction 05/29/2014  . Hyperlipidemia 05/29/2014  . Follow up 05/13/2014  . Cigarette nicotine dependence with withdrawal 05/13/2014  . Encounter for therapeutic drug monitoring 04/04/2014  . LV (left ventricular) mural thrombus 03/29/2014  . Tobacco abuse 03/27/2014  . Acute anterior wall MI ([facility]) 03/27/2014  . NSTEMI (non-ST elevated myocardial infarction) ([facility]) 03/27/2014    Roderic Scarce PT 05/23/2019, 12:03  PM  [facility] [address] [place], [place], [address] Phone: [phone]   Fax:  [phone]  Name: Thomos Domine MRN: [id number] Date of Birth: 10-24-1965

## 2019-05-29 MED FILL — CHANTIX 1 MG CONT MONTH BOX: 1 | 28 days supply | Qty: 56 | Fill #1

## 2019-05-29 MED FILL — traMADol HCL 50 MG TABS: 50 | 7 days supply | Qty: 14 | Fill #1

## 2019-05-31 ENCOUNTER — Other Ambulatory Visit: Payer: Self-pay

## 2019-05-31 ENCOUNTER — Ambulatory Visit: Payer: 59 | Admitting: Physical Therapy

## 2019-05-31 ENCOUNTER — Encounter: Payer: Self-pay | Admitting: Physical Therapy

## 2019-05-31 DIAGNOSIS — M545 Low back pain: Secondary | ICD-10-CM | POA: Diagnosis not present

## 2019-05-31 DIAGNOSIS — M6281 Muscle weakness (generalized): Secondary | ICD-10-CM

## 2019-05-31 DIAGNOSIS — G8929 Other chronic pain: Secondary | ICD-10-CM

## 2019-05-31 NOTE — Therapy (Signed)
[facility] [address] [place], [place], [address] Phone: [phone]   Fax:  [phone]  Physical Therapy Treatment  Patient Details  Name: Thomas Woods MRN: [id number] Date of Birth: February 15, 1965 Referring Provider (PT): Hoy Register, MD   Encounter Date: 05/31/2019  PT End of Session - 05/31/19 1050    Visit Number  3    Number of Visits  8    Date for PT Re-Evaluation  07/08/19    Authorization Type  [employer]    PT Start Time  1045    PT Stop Time  1145    PT Time Calculation (min)  60 min    Activity Tolerance  Patient tolerated treatment well    Behavior During Therapy  [facility] for tasks assessed/performed       Past Medical History:  Diagnosis Date  . Hyperlipidemia LDL goal <70 03/27/2014  . Hypertension   . Ischemic cardiomyopathy 03/27/2014   EF 30% at cath, confirmed by echo  . Multiple lung nodules on CT 03/28/2014   Right lung nodules up to 6 mm, repeat CT in 6-12 months  . 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 NSTEMI (non-ST elevated myocardial infarction) ([facility]) 03/26/2014   Patient pain increased and he developed ECG changes, taken to the cath lab as a STEMI, PCI to the LAD    Past Surgical History:  Procedure Laterality Date  . HERNIA REPAIR    . LEFT HEART CATHETERIZATION WITH CORONARY ANGIOGRAM N/A 03/27/2014   Procedure: LEFT HEART CATHETERIZATION WITH CORONARY ANGIOGRAM;  Surgeon: Corky Crafts, MD; L main okay, LAD 100%, CFX okay, RI moderate disease, OM1 mild disease, RCA moderate disease, EF 30%  . PERCUTANEOUS CORONARY STENT INTERVENTION (PCI-S)  03/27/2014   3.0 x 32 Synergy overlapped with 2.5 x 38 Synergy to the mLAD    There were no vitals filed for this visit.  Subjective Assessment - 05/31/19 1048    Subjective  Patient reports he continues to feel better. He is consistent with his exercises and using the ball to massage his lower back.     Patient Stated Goals  Improve pain and movement so he can return to work.    Currently in Pain?  Yes    Pain Score  4     Pain Location  Back    Pain Orientation  Right;Lower    Pain Descriptors / Indicators  Aching;Spasm    Pain Type  Chronic pain    Pain Onset  More than a month ago    Pain Frequency  Intermittent                        [facility] Adult PT Treatment/Exercise - 05/31/19 0001      Self-Care   Self-Care  Other Self-Care Comments    Other Self-Care Comments   SMFR using tennis ball against wall for right lumbar paraspinals      Exercises   Exercises  Lumbar      Lumbar Exercises: Stretches   Prone on Elbows Stretch Limitations  10 reps with 5 sec hold      Lumbar Exercises: Aerobic   Nustep  L5 x 5 min (UE and LE)      Lumbar Exercises: Standing   Lifting Weights (lbs)  10# x10, 15# x5, 25# x10    Lifting Limitations  Dead lift from 8" box using kettlebell - cueing for proper  form hip hinge lifting form    Other Standing Lumbar Exercises  Left lateral shift correction against wall x10 through comfortable range    Other Standing Lumbar Exercises  Lumbar extension with forearms on wall x10      Lumbar Exercises: Seated   Sit to Stand  10 reps    Sit to Stand Limitations  Focus on hip hinge form       Lumbar Exercises: Quadruped   Madcat/Old Horse  10 reps    Madcat/Old Horse Limitations  max cueing for proper form      Modalities   Modalities  Moist Heat;Electrical Stimulation      Moist Heat Therapy   Number Minutes Moist Heat  15 Minutes    Moist Heat Location  Lumbar Spine      Electrical Stimulation   Electrical Stimulation Location  Lumbar paraspinals    Electrical Stimulation Action  IFC 80-150 x 15 min    Electrical Stimulation Parameters  Patient tolerance for intensity    Electrical Stimulation Goals  Pain;Tone      Manual Therapy   Manual Therapy  Soft tissue mobilization    Soft tissue mobilization  STM to right lumbar  paraspinals while prone, standing foam roller against wall for lumbar paraspinals             PT Education - 05/31/19 1049    Education Details  HEP, lifting mechanics    Person(s) Educated  Patient    Methods  Explanation;Demonstration;Tactile cues;Verbal cues    Comprehension  Verbalized understanding;Returned demonstration;Verbal cues required;Tactile cues required;Need further instruction       PT Short Term Goals - 05/14/19 0811      PT SHORT TERM GOAL #1   Title  Patient will be I with initial HEP to progress with PT    Time  4    Period  Weeks    Status  New    Target Date  06/10/19      PT SHORT TERM GOAL #2   Title  Patient will exhibit proper posture without lateral shift toward left    Time  4    Period  Weeks    Status  New    Target Date  06/10/19      PT SHORT TERM GOAL #3   Title  Patient will report improved pain with activity to </= 5/10 to improve walking and standing ability    Time  4    Period  Weeks    Status  New    Target Date  06/10/19        PT Long Term Goals - 05/14/19 0813      PT LONG TERM GOAL #1   Title  Patient will be I with final HEP to maintain progress from PT    Time  8    Period  Weeks    Status  New    Target Date  07/08/19      PT LONG TERM GOAL #2   Title  Patient will report improved functional ability to </= 46% limitation on FOTO    Time  8    Period  Weeks    Status  New    Target Date  07/08/19      PT LONG TERM GOAL #3   Title  Patient will exhibit lumbar AROM grossly WFL to allow for improved bed mobility and ability to perform household tasks    Time  8    Period  Weeks    Status  New    Target Date  07/08/19      PT LONG TERM GOAL #4   Title  Patient will exhibit >/= 4+/5 core and hip strength to allow for proper lifting mechanics and return to work without limitation    Time  8    Period  Weeks    Status  New    Target Date  07/08/19      PT LONG TERM GOAL #5   Title  Patient will be able to  walk and stand >/= 1 hour with </= 3/10 pain in order to improve community activity and return to work    Time  8    Period  Weeks    Status  New    Target Date  07/08/19            Plan - 05/31/19 1050    Clinical Impression Statement  Patient tolerated therapy well with no adverse effects. He reports improvement with manual therapy and use of ball at home for right paraspinals. Continued to work on lateral shift and extension based exercises with good tolerance. Was able to progress lifting this visit. He did report pulling sensation with lifting but able to tolerate without significant increase in pain or pain down the leg. Patient would benefit from continued skilled PT to progress his motion and strength to return to work without limitation or pain.    PT Treatment/Interventions  ADLs/Self Care Home Management;Cryotherapy;Electrical Stimulation;Moist Heat;Traction;Gait training;Stair training;Functional mobility training;Therapeutic activities;Therapeutic exercise;Balance training;Neuromuscular re-education;Manual techniques;Patient/family education;Passive range of motion;Dry needling;Joint Manipulations;Spinal Manipulations;Taping    PT Next Visit Plan  manual work to tightness in paraspinals if needed, core stability - progress to dead lifts if able to bend without pain in preparation for return to work    PT [place]: standing left lateral shift correction, standing lumbar extension with forearms on wall, supine LTR, SMFR using tennis ball for right paraspinals    Consulted and Agree with Plan of Care  Patient       Patient will benefit from skilled therapeutic intervention in order to improve the following deficits and impairments:  Abnormal gait, Decreased range of motion, Difficulty walking, Increased muscle spasms, Cardiopulmonary status limiting activity, Pain, Decreased activity tolerance, Impaired flexibility, Improper body mechanics, Postural dysfunction,  Decreased strength  Visit Diagnosis: Chronic right-sided low back pain, unspecified whether sciatica present  Muscle weakness (generalized)     Problem List Patient Active Problem List   Diagnosis Date Noted  . Smoking addiction 08/03/2016  . Adjustment disorder with mixed anxiety and depressed mood 08/03/2016  . Essential hypertension 08/24/2015  . Old MI (myocardial infarction) 08/24/2015  . CAD (coronary artery disease) 02/23/2015  . Lung nodule 10/20/2014  . Cardiomyopathy, ischemic 10/20/2014  . Erectile dysfunction 05/29/2014  . Hyperlipidemia 05/29/2014  . Follow up 05/13/2014  . Cigarette nicotine dependence with withdrawal 05/13/2014  . Encounter for therapeutic drug monitoring 04/04/2014  . LV (left ventricular) mural thrombus 03/29/2014  . Tobacco abuse 03/27/2014  . Acute anterior wall MI ([place]) 03/27/2014  . NSTEMI (non-ST elevated myocardial infarction) ([place]) 03/27/2014    Hilda Blades, PT, DPT, LAT, ATC 05/31/19  12:12 PM Phone: [phone] Fax: [employer] [facility] [address] [place], [place], [address] Phone: [phone]   Fax:  [phone]  Name: Devaun Hernandez MRN: [id number] Date of Birth: [birth year]

## 2019-06-04 ENCOUNTER — Other Ambulatory Visit: Payer: 59 | Admitting: *Deleted

## 2019-06-04 ENCOUNTER — Other Ambulatory Visit: Payer: Self-pay

## 2019-06-04 DIAGNOSIS — I252 Old myocardial infarction: Secondary | ICD-10-CM

## 2019-06-04 DIAGNOSIS — E782 Mixed hyperlipidemia: Secondary | ICD-10-CM

## 2019-06-04 DIAGNOSIS — I25119 Atherosclerotic heart disease of native coronary artery with unspecified angina pectoris: Secondary | ICD-10-CM

## 2019-06-04 DIAGNOSIS — Z720 Tobacco use: Secondary | ICD-10-CM

## 2019-06-04 DIAGNOSIS — I10 Essential (primary) hypertension: Secondary | ICD-10-CM

## 2019-06-04 LAB — COMPREHENSIVE METABOLIC PANEL
ALT: 28 IU/L (ref 0–44)
AST: 34 IU/L (ref 0–40)
Albumin/Globulin Ratio: 1.5 (ref 1.2–2.2)
Albumin: 4.5 g/dL (ref 3.8–4.9)
Alkaline Phosphatase: 46 IU/L — ABNORMAL LOW (ref 48–121)
BUN/Creatinine Ratio: 15 (ref 9–20)
BUN: 13 mg/dL (ref 6–24)
Bilirubin Total: 0.8 mg/dL (ref 0.0–1.2)
CO2: 21 mmol/L (ref 20–29)
Calcium: 9.5 mg/dL (ref 8.7–10.2)
Chloride: 102 mmol/L (ref 96–106)
Creatinine, Ser: 0.87 mg/dL (ref 0.76–1.27)
GFR calc Af Amer: 114 mL/min/{1.73_m2} (ref >59)
GFR calc non Af Amer: 99 mL/min/{1.73_m2} (ref >59)
Globulin, Total: 3 g/dL (ref 1.5–4.5)
Glucose: 80 mg/dL (ref 65–99)
Potassium: 3.7 mmol/L (ref 3.5–5.2)
Sodium: 139 mmol/L (ref 134–144)
Total Protein: 7.5 g/dL (ref 6.0–8.5)

## 2019-06-04 LAB — LIPID PANEL
Chol/HDL Ratio: 2.2 ratio (ref 0.0–5.0)
Cholesterol, Total: 164 mg/dL (ref 100–199)
HDL: 75 mg/dL (ref >39)
LDL Chol Calc (NIH): 77 mg/dL (ref 0–99)
Triglycerides: 58 mg/dL (ref 0–149)
VLDL Cholesterol Cal: 12 mg/dL (ref 5–40)

## 2019-06-07 ENCOUNTER — Ambulatory Visit: Payer: 59 | Admitting: Physical Therapy

## 2019-06-13 ENCOUNTER — Other Ambulatory Visit: Payer: Self-pay

## 2019-06-13 ENCOUNTER — Encounter: Payer: Self-pay | Admitting: Physical Therapy

## 2019-06-13 ENCOUNTER — Ambulatory Visit: Payer: 59 | Attending: Family Medicine | Admitting: Physical Therapy

## 2019-06-13 DIAGNOSIS — M545 Low back pain: Secondary | ICD-10-CM | POA: Insufficient documentation

## 2019-06-13 DIAGNOSIS — M6281 Muscle weakness (generalized): Secondary | ICD-10-CM | POA: Diagnosis present

## 2019-06-13 DIAGNOSIS — G8929 Other chronic pain: Secondary | ICD-10-CM | POA: Insufficient documentation

## 2019-06-13 NOTE — Therapy (Signed)
[place], [place], [address] Phone: [phone]   Fax:  [phone]  Physical Therapy Treatment  Patient Details  Name: Thomas Woods MRN: [id number] Date of Birth: 04-12-1965 Referring Provider (PT): Charlott Rakes, MD   Encounter Date: 06/13/2019  PT End of Session - 06/13/19 1022    Visit Number  4    Number of Visits  8    Date for PT Re-Evaluation  07/08/19    Authorization Type  [employer]    PT Start Time  1045    PT Stop Time  1145    PT Time Calculation (min)  60 min    Activity Tolerance  Patient tolerated treatment well    Behavior During Therapy  [facility] for tasks assessed/performed       Past Medical History:  Diagnosis Date  . Hyperlipidemia LDL goal <70 03/27/2014  . Hypertension   . Ischemic cardiomyopathy 03/27/2014   EF 30% at cath, confirmed by echo  . Multiple lung nodules on CT 03/28/2014   Right lung nodules up to 6 mm, repeat CT in 6-12 months  . Mural thrombus of cardiac apex following MI ([place]) 03/27/2014   On Coumadin  . NSTEMI (non-ST elevated myocardial infarction) ([place]) 03/26/2014   Patient pain increased and he developed ECG changes, taken to the cath lab as a STEMI, PCI to the LAD    Past Surgical History:  Procedure Laterality Date  . HERNIA REPAIR    . LEFT HEART CATHETERIZATION WITH CORONARY ANGIOGRAM N/A 03/27/2014   Procedure: LEFT HEART CATHETERIZATION WITH CORONARY ANGIOGRAM;  Surgeon: Jettie Booze, MD; L main okay, LAD 100%, CFX okay, RI moderate disease, OM1 mild disease, RCA moderate disease, EF 30%  . PERCUTANEOUS CORONARY STENT INTERVENTION (PCI-S)  03/27/2014   3.0 x 32 Synergy overlapped with 2.5 x 38 Synergy to the mLAD    There were no vitals filed for this visit.  Subjective Assessment - 06/13/19 1021    Subjective  Patient reports he is getting better, he is not have nearly as many spasms now. The morning time is still worse for him.     Currently in Pain?  Yes    Pain Score  2     Pain Location  Back    Pain Orientation  Lower;Right    Pain Descriptors / Indicators  Aching;Tightness    Pain Type  Chronic pain    Pain Onset  More than a month ago    Pain Frequency  Intermittent         [facility] PT Assessment - 06/13/19 0001      Posture/Postural Control   Posture Comments  Patient does not exhibit lateral shift this visit                    [place] Adult PT Treatment/Exercise - 06/13/19 0001      Exercises   Exercises  Lumbar      Lumbar Exercises: Aerobic   Nustep  L5 x 5 min (UE and LE)      Lumbar Exercises: Standing   Lifting Weights (lbs)  25# x10, 30# x10,     Lifting Limitations  Dead lift from 8" box using kettlebell - cueing for proper form hip hinge lifting form    Other Standing Lumbar Exercises  Pallof press with 7# 2x5 3 sec hold   [employer]     Lumbar Exercises: Seated   Sit to Stand  10 reps  Sit to Stand Limitations  Focus on hip hinge form       Lumbar Exercises: Quadruped   Opposite Arm/Leg Raise  10 reps;3 seconds   2 sets   Other Quadruped Lumbar Exercises  Quad rocking to work on lumbar flexion stretch x5 5 sec hold      Modalities   Modalities  Moist Heat;Electrical Stimulation      Moist Heat Therapy   Number Minutes Moist Heat  15 Minutes    Moist Heat Location  Lumbar Spine      Electrical Stimulation   Electrical Stimulation Location  Lumbar paraspinals    Electrical Stimulation Action  IFC 80-150 x 15 min    Electrical Stimulation Parameters  Patient tolerance for intensity    Electrical Stimulation Goals  Pain;Tone             PT Education - 06/13/19 1021    Education Details  HEP    Person(s) Educated  Patient    Methods  Explanation;Demonstration;Verbal cues    Comprehension  Verbalized understanding;Returned demonstration;Verbal cues required;Need further instruction       PT Short Term Goals - 06/13/19 1106      PT SHORT TERM GOAL #1    Title  Patient will be I with initial HEP to progress with PT    Time  4    Period  Weeks    Status  On-going    Target Date  06/10/19      PT SHORT TERM GOAL #2   Title  Patient will exhibit proper posture without lateral shift toward left    Time  4    Period  Weeks    Status  Achieved    Target Date  06/10/19      PT SHORT TERM GOAL #3   Title  Patient will report improved pain with activity to </= 5/10 to improve walking and standing ability    Time  4    Period  Weeks    Status  Achieved    Target Date  06/10/19        PT Long Term Goals - 05/14/19 0813      PT LONG TERM GOAL #1   Title  Patient will be I with final HEP to maintain progress from PT    Time  8    Period  Weeks    Status  New    Target Date  07/08/19      PT LONG TERM GOAL #2   Title  Patient will report improved functional ability to </= 46% limitation on FOTO    Time  8    Period  Weeks    Status  New    Target Date  07/08/19      PT LONG TERM GOAL #3   Title  Patient will exhibit lumbar AROM grossly WFL to allow for improved bed mobility and ability to perform household tasks    Time  8    Period  Weeks    Status  New    Target Date  07/08/19      PT LONG TERM GOAL #4   Title  Patient will exhibit >/= 4+/5 core and hip strength to allow for proper lifting mechanics and return to work without limitation    Time  8    Period  Weeks    Status  New    Target Date  07/08/19      PT LONG TERM GOAL #5  Title  Patient will be able to walk and stand >/= 1 hour with </= 3/10 pain in order to improve community activity and return to work    Time  8    Period  Weeks    Status  New    Target Date  07/08/19            Plan - 06/13/19 1022    Clinical Impression Statement  Patient tolerated therapy well with no adverse effects. Core strengthening was progressed this visit with good tolerance. Patient does report lower back pain with lifting but states it continues to improve and he is able  to lift more. Continued use of heat/e-stim to reduce tightness and discomfort following therapy with good benefit. Less cueing required for lifting technique this visit. Patient would benefit from continued skilled PT to progress his motion and strength to return to work without limitation or pain.    PT Treatment/Interventions  ADLs/Self Care Home Management;Cryotherapy;Electrical Stimulation;Moist Heat;Traction;Gait training;Stair training;Functional mobility training;Therapeutic activities;Therapeutic exercise;Balance training;Neuromuscular re-education;Manual techniques;Patient/family education;Passive range of motion;Dry needling;Joint Manipulations;Spinal Manipulations;Taping    PT Next Visit Plan  manual work to tightness in paraspinals if needed, core stability - progress to dead lifts if able to bend without pain in preparation for return to work    PT Home Exercise Plan  [email or web address]: standing left lateral shift correction, standing lumbar extension with forearms on wall, supine LTR, SMFR using tennis ball for right paraspinals, sit<>stand, bird dog, quad rocking    Consulted and Agree with Plan of Care  Patient       Patient will benefit from skilled therapeutic intervention in order to improve the following deficits and impairments:  Abnormal gait, Decreased range of motion, Difficulty walking, Increased muscle spasms, Cardiopulmonary status limiting activity, Pain, Decreased activity tolerance, Impaired flexibility, Improper body mechanics, Postural dysfunction, Decreased strength  Visit Diagnosis: Chronic right-sided low back pain, unspecified whether sciatica present  Muscle weakness (generalized)     Problem List Patient Active Problem List   Diagnosis Date Noted  . Smoking addiction 08/03/2016  . Adjustment disorder with mixed anxiety and depressed mood 08/03/2016  . Essential hypertension 08/24/2015  . Old MI (myocardial infarction) 08/24/2015  . CAD (coronary artery disease)  02/23/2015  . Lung nodule 10/20/2014  . Cardiomyopathy, ischemic 10/20/2014  . Erectile dysfunction 05/29/2014  . Hyperlipidemia 05/29/2014  . Follow up 05/13/2014  . Cigarette nicotine dependence with withdrawal 05/13/2014  . Encounter for therapeutic drug monitoring 04/04/2014  . LV (left ventricular) mural thrombus 03/29/2014  . Tobacco abuse 03/27/2014  . Acute anterior wall MI ([facility]) 03/27/2014  . NSTEMI (non-ST elevated myocardial infarction) ([facility]) 03/27/2014    Rosana Hoes, PT, DPT, LAT, ATC 06/13/19  11:48 AM Phone: [phone] Fax: [phone]   [facility] [address] [place], [place], [address] Phone: [phone]   Fax:  [phone]  Name: Abdoul Encinas MRN: [id number] Date of Birth: [birth year]

## 2019-06-13 NOTE — Patient Instructions (Addendum)
Access Code: [email or web address] URL: [email or web address] Date: 05/13/2019 Prepared by: Rosana Hoes  Exercises Left Standing Lateral Shift Correction at Wall - Repetitions - 10-15 reps - 3-5 seconds hold Standing Lumbar Extension at Wall - Forearms - 10-15 reps - 3-5 seconds hold Supine Lower Trunk Rotation - 10 reps - 3-5 seconds hold Sit to Stand without Arm Support - 3 x daily - 10 reps Bird Dog - 2 x daily - 2 sets - 5 reps - 3 seconds hold Quadruped Rocking Backward - 3 x daily - 7 x weekly - 10 reps - 5 seconds hold

## 2019-06-18 ENCOUNTER — Telehealth: Payer: Self-pay | Admitting: Family Medicine

## 2019-06-18 NOTE — Telephone Encounter (Signed)
Patient needed to ask question about physical therapy.

## 2019-06-20 NOTE — Telephone Encounter (Signed)
Patient was called and a voicemail was left informing patient to return phone call. 

## 2019-06-21 ENCOUNTER — Ambulatory Visit: Payer: 59 | Admitting: Physical Therapy

## 2019-06-27 ENCOUNTER — Encounter: Payer: Self-pay | Admitting: Physical Therapy

## 2019-06-27 ENCOUNTER — Other Ambulatory Visit: Payer: Self-pay

## 2019-06-27 ENCOUNTER — Ambulatory Visit: Payer: 59 | Admitting: Physical Therapy

## 2019-06-27 DIAGNOSIS — G8929 Other chronic pain: Secondary | ICD-10-CM

## 2019-06-27 DIAGNOSIS — M6281 Muscle weakness (generalized): Secondary | ICD-10-CM

## 2019-06-27 DIAGNOSIS — M545 Low back pain: Secondary | ICD-10-CM | POA: Diagnosis not present

## 2019-06-27 NOTE — Patient Instructions (Signed)
Access Code: [email or web address] URL: [email or web address] Date: 06/27/2019 Prepared by: Rosana Hoes  Exercises Left Standing Lateral Shift Correction at Wall - Repetitions - 10-15 reps - 3-5 seconds hold Standing Lumbar Extension at Wall - Forearms - 10-15 reps - 3-5 seconds hold Supine Lower Trunk Rotation - 2-3 x daily - 10 reps - 3-5 seconds hold Quadruped Rocking Backward - 2-3 x daily - 10 reps - 5 seconds hold Standing Quadratus Lumborum Mobilization with Small Ball on Wall - 2-3 x daily Bird Dog - 1 x daily - 2 sets - 5 reps - 3 seconds hold Sit to Stand without Arm Support - 3 x daily - 10 reps

## 2019-06-28 NOTE — Therapy (Addendum)
[place], [place], [address] Phone: [phone]   Fax:  [phone]  Physical Therapy Treatment / Discharge   Progress Note Reporting Period 05/14/2019 to 06/27/2019  See note below for Objective Data and Assessment of Progress/Goals.    Patient Details  Name: Thomas Woods MRN: [id number] Date of Birth: [birth year] Referring Provider (PT): Charlott Rakes, MD   Encounter Date: 06/27/2019   PT End of Session - 06/27/19 1456    Visit Number 5    Number of Visits 8    Date for PT Re-Evaluation 08/22/19    Authorization Type [employer]    PT Start Time 5449    PT Stop Time 1540   10 min moist heat not included in billed time   PT Time Calculation (min) 55 min    Activity Tolerance Patient tolerated treatment well    Behavior During Therapy [facility] for tasks assessed/performed           Past Medical History:  Diagnosis Date  . Hyperlipidemia LDL goal <70 03/27/2014  . Hypertension   . Ischemic cardiomyopathy 03/27/2014   EF 30% at cath, confirmed by echo  . Multiple lung nodules on CT 03/28/2014   Right lung nodules up to 6 mm, repeat CT in 6-12 months  . Mural thrombus of cardiac apex following MI ([place]) 03/27/2014   On Coumadin  . NSTEMI (non-ST elevated myocardial infarction) ([address] [address]) 03/26/2014   Patient pain increased and he developed ECG changes, taken to the cath lab as a STEMI, PCI to the LAD    Past Surgical History:  Procedure Laterality Date  . HERNIA REPAIR    . LEFT HEART CATHETERIZATION WITH CORONARY ANGIOGRAM N/A 03/27/2014   Procedure: LEFT HEART CATHETERIZATION WITH CORONARY ANGIOGRAM;  Surgeon: Jettie Booze, MD; L main okay, LAD 100%, CFX okay, RI moderate disease, OM1 mild disease, RCA moderate disease, EF 30%  . PERCUTANEOUS CORONARY STENT INTERVENTION (PCI-S)  03/27/2014   3.0 x 32 Synergy overlapped with 2.5 x 38 Synergy to the mLAD    There were no vitals filed for this  visit.   Subjective Assessment - 06/27/19 1451    Subjective Patient reports he continues to have lower back pain. He states he was feeling a little bit better, but feels his lower back has gotten a little worse recently. He hasn't been having any leg pain or spasms recently. Patient reports his back is messed up because when he bends down it still is tough for him to get up.    Patient Stated Goals Improve pain and movement so he can return to work.    Currently in Pain? Yes    Pain Score 4     Pain Location Back    Pain Orientation Lower    Pain Descriptors / Indicators Aching;Tightness    Pain Type Chronic pain    Pain Radiating Towards denies any radiating pain    Pain Onset More than a month ago    Pain Frequency Intermittent    Aggravating Factors  Getting up from a chair, rising from a bent position    Pain Relieving Factors Strretching and moving around    Effect of Pain on Daily Activities Patient is unable to work right now due to low back pain              [facility] PT Assessment - 06/28/19 0001      Assessment   Medical Diagnosis Lower back pain  Referring Provider (PT) Charlott Rakes, MD    Onset Date/Surgical Date --   approximately 1 year   Next MD Visit Not scheduled      Precautions   Precautions None      Restrictions   Weight Bearing Restrictions No      Balance Screen   Has the patient fallen in the past 6 months No      Prior Function   Level of Independence Independent    Vocation Full time employment   not currently working due to low back pain   Vocation Requirements Works at [employer]    Leisure None reported      Observation/Other Assessments   Observations Patient appears in no apparent distress    Focus on Therapeutic Outcomes (FOTO)  49% limitation      Posture/Postural Control   Posture Comments No lateral shift, increased lumbar lordosis      AROM   Lumbar Flexion Fingertips to distal shin, lumbar spine remains  in relatively neutral position   pulling in right lower back   Lumbar Extension 75%    Lumbar - Right Side Bend 75%    Lumbar - Left Side Bend 50%   pulling in right lower back   Lumbar - Right Rotation 75%    Lumbar - Left Rotation 75%      Strength   Right Hip Flexion 4/5    Right Hip Extension 4/5    Right Hip ABduction 4-/5    Left Hip Flexion 4/5    Left Hip Extension 4/5    Left Hip ABduction 4-/5    Right Knee Flexion 5/5    Right Knee Extension 5/5    Left Knee Flexion 5/5    Left Knee Extension 5/5    Right Ankle Dorsiflexion 5/5    Right Ankle Plantar Flexion 5/5    Left Ankle Dorsiflexion 5/5    Left Ankle Plantar Flexion 5/5      Palpation   Palpation comment TTP right QL and lumbar paraspinals      Slump test   Comment Negative for radicular symptoms      Straight Leg Raise   Comment Negative for radicular symptoms                         [facility] Adult PT Treatment/Exercise - 06/28/19 0001      Exercises   Exercises Lumbar      Lumbar Exercises: Aerobic   Nustep L5 x 4 min (UE and LE)      Lumbar Exercises: Standing   Other Standing Lumbar Exercises Pelvic tilting with back against wall x10   focus on lumbar flexion/flattening back   Other Standing Lumbar Exercises Self TPR using tennis ball for right QL region      Lumbar Exercises: Quadruped   Opposite Arm/Leg Raise 10 reps;3 seconds    Other Quadruped Lumbar Exercises Quad rocking to work on lumbar flexion stretch x5 5 sec hold      Modalities   Modalities Moist Heat   performed following therapy/dry needling     Moist Heat Therapy   Number Minutes Moist Heat 10 Minutes    Moist Heat Location Lumbar Spine      Manual Therapy   Manual Therapy Soft tissue mobilization;Myofascial release    Soft tissue mobilization Skilled palpation and continued monitoring for TPDN    Myofascial Release Right QL and lumbar paraspinals  Trigger Point Dry Needling - 06/28/19 0001     Consent Given? Yes    Education Handout Provided No    Muscles Treated Back/Hip Quadratus lumborum    Quadratus Lumborum Response Twitch response elicited;Palpable increased muscle length         TPDN performed by Carlus Pavlov, PT/DPT       PT Education - 06/27/19 1455    Education Details HEP, lifting mechanics, stretching    Person(s) Educated Patient    Methods Explanation;Demonstration;Verbal cues    Comprehension Verbalized understanding;Returned demonstration;Verbal cues required;Need further instruction            PT Short Term Goals - 06/28/19 0810      PT SHORT TERM GOAL #1   Title Patient will be I with initial HEP to progress with PT    Time 4    Period Weeks    Status Achieved    Target Date 06/10/19      PT SHORT TERM GOAL #2   Title Patient will exhibit proper posture without lateral shift toward left    Time 4    Period Weeks    Status Achieved    Target Date 06/10/19      PT SHORT TERM GOAL #3   Title Patient will report improved pain with activity to </= 5/10 to improve walking and standing ability    Time 4    Period Weeks    Status Achieved    Target Date 06/10/19             PT Long Term Goals - 06/28/19 0811      PT LONG TERM GOAL #1   Title Patient will be I with final HEP to maintain progress from PT    Time 8    Period Weeks    Status On-going    Target Date 08/22/19      PT LONG TERM GOAL #2   Title Patient will report improved functional ability to </= 46% limitation on FOTO    Time 8    Period Weeks    Status On-going    Target Date 08/22/19      PT LONG TERM GOAL #3   Title Patient will exhibit lumbar AROM grossly WFL to allow for improved bed mobility and ability to perform household tasks    Time 8    Period Weeks    Status On-going    Target Date 08/22/19      PT LONG TERM GOAL #4   Title Patient will exhibit >/= 4+/5 core and hip strength to allow for proper lifting mechanics and return to work without  limitation    Time 8    Period Weeks    Status On-going    Target Date 08/22/19      PT LONG TERM GOAL #5   Title Patient will be able to walk and stand >/= 1 hour with </= 3/10 pain in order to improve community activity and return to work    Time 8    Period Weeks    Status On-going    Target Date 08/22/19                 Plan - 06/27/19 1456    Clinical Impression Statement Patient tolerated therapy well with no advers effects. He demonstrates an improvement in his motion and strength compared to evaluation, with no leg pain and less low back pain. He has progressed well with lifting but continues to report increased  pain with transitioning to a standing position and when rising from a bent position. His pain at this point seems muscular related, possible QL tightness or spasm. He has a difficulty time with lumbar flexion and when bending forward he remains in a neutral or extended position of the lumbar spine. Trialed dry needling for the right QL this visit and patient did report slight improvement in symptoms following the treatment. He was encouraged to work on light stretching and using ball to help massage the muscle, and continue working on Midwife. He would benefit from continued skilled PT to reduce his pain and improve movement so he can return to work without limitation.    PT Frequency 1x / week    PT Duration 8 weeks    PT Treatment/Interventions ADLs/Self Care Home Management;Cryotherapy;Electrical Stimulation;Moist Heat;Traction;Gait training;Stair training;Functional mobility training;Therapeutic activities;Therapeutic exercise;Balance training;Neuromuscular re-education;Manual techniques;Patient/family education;Passive range of motion;Dry needling;Joint Manipulations;Spinal Manipulations;Taping    PT Next Visit Plan manual work to tightness in paraspinals if needed, core stability - progress to dead lifts if able to bend without pain in preparation for return  to work    PT [place]: standing left lateral shift correction, standing lumbar extension with forearms on wall, supine LTR, SMFR using tennis ball for right paraspinals, sit<>stand, bird dog, quad rocking    Consulted and Agree with Plan of Care Patient           Patient will benefit from skilled therapeutic intervention in order to improve the following deficits and impairments:  Abnormal gait, Decreased range of motion, Difficulty walking, Increased muscle spasms, Cardiopulmonary status limiting activity, Pain, Decreased activity tolerance, Impaired flexibility, Improper body mechanics, Postural dysfunction, Decreased strength  Visit Diagnosis: Chronic right-sided low back pain, unspecified whether sciatica present  Muscle weakness (generalized)     Problem List Patient Active Problem List   Diagnosis Date Noted  . Smoking addiction 08/03/2016  . Adjustment disorder with mixed anxiety and depressed mood 08/03/2016  . Essential hypertension 08/24/2015  . Old MI (myocardial infarction) 08/24/2015  . CAD (coronary artery disease) 02/23/2015  . Lung nodule 10/20/2014  . Cardiomyopathy, ischemic 10/20/2014  . Erectile dysfunction 05/29/2014  . Hyperlipidemia 05/29/2014  . Follow up 05/13/2014  . Cigarette nicotine dependence with withdrawal 05/13/2014  . Encounter for therapeutic drug monitoring 04/04/2014  . LV (left ventricular) mural thrombus 03/29/2014  . Tobacco abuse 03/27/2014  . Acute anterior wall MI ([place]) 03/27/2014  . NSTEMI (non-ST elevated myocardial infarction) ([place]) 03/27/2014    Hilda Blades, PT, DPT, LAT, ATC 06/28/19  8:21 AM Phone: [phone] Fax: [facility] [place], [place], [address] Phone: [phone]   Fax:  [phone]  Name: Giorgi Debruin MRN: [id number] Date of Birth: 02/28/1965    PHYSICAL THERAPY DISCHARGE SUMMARY  Visits from Start of  Care: 5  Current functional level related to goals / functional outcomes: See above   Remaining deficits: See above   Education / Equipment: HEP Plan: Patient agrees to discharge.  Patient goals were not met. Patient is being discharged due to not returning since the last visit.  ?????    Hilda Blades, PT, DPT, LAT, ATC 09/19/19  3:38 PM Phone: [phone] Fax: [phone]

## 2019-06-28 NOTE — Addendum Note (Signed)
Addended by: Hilbert Bible on: 06/28/2019 08:23 AM   Modules accepted: Orders

## 2019-07-05 MED FILL — CHANTIX 1 MG CONT MONTH BOX: 1 | 28 days supply | Qty: 56 | Fill #2

## 2019-07-05 MED FILL — GABAPENTIN 300 MG CAPSULE: 300 | 30 days supply | Qty: 60 | Fill #2

## 2019-07-05 MED FILL — ROSUVASTATIN CALCIUM 20 MG: 20 | 90 days supply | Qty: 90 | Fill #1

## 2019-07-16 ENCOUNTER — Ambulatory Visit: Payer: 59 | Admitting: Physical Therapy

## 2019-07-19 ENCOUNTER — Other Ambulatory Visit: Payer: Self-pay | Admitting: Family Medicine

## 2019-07-19 ENCOUNTER — Other Ambulatory Visit: Payer: Self-pay | Admitting: Interventional Cardiology

## 2019-07-19 DIAGNOSIS — M5431 Sciatica, right side: Secondary | ICD-10-CM

## 2019-07-19 MED FILL — CLOPIDOGREL 75 MG TABLET: 75 | 90 days supply | Qty: 90 | Fill #1

## 2019-07-19 NOTE — Telephone Encounter (Signed)
Requested medication (s) are due for refill today: {yes  Requested medication (s) are on the active medication list: yes  Last refill: 03/20/19  #90 -1 refill  Future visit scheduled: No  Notes to clinic: Med not delegated    Requested Prescriptions  Pending Prescriptions Disp Refills   methocarbamol (ROBAXIN) 500 MG tablet [Pharmacy Med Name: METHOCARBAMOL 500 MG TABS 500 Tablet] 90 tablet 1    Sig: Take 1 tablet (500 mg total) by mouth every 8 (eight) hours as needed for muscle spasms.      Not Delegated - Analgesics:  Muscle Relaxants Failed - 07/19/2019 11:43 AM      Failed - This refill cannot be delegated      Passed - Valid encounter within last 6 months    Recent Outpatient Visits           2 months ago Chronic right-sided low back pain with right-sided sciatica   [facility] Hoy Register, MD   4 months ago Other fatigue   [facility] [place], Odette Horns, MD   1 year ago Coronary artery disease involving native coronary artery of native heart with angina pectoris [facility])   [facility] Hoy Register, MD   1 year ago Sciatica of right side   [facility] Hoy Register, MD   1 year ago Essential hypertension   [facility] Hoy Register, MD

## 2019-07-22 MED FILL — CHANTIX 1 MG CONT MONTH BOX: 1 | 28 days supply | Qty: 56 | Fill #2 | Status: TO

## 2019-07-22 MED FILL — METHOCARBAMOL 500 MG TABS: 500 | 30 days supply | Qty: 90 | Fill #0

## 2019-08-05 ENCOUNTER — Ambulatory Visit: Payer: 59 | Attending: Family Medicine | Admitting: Physical Therapy

## 2019-08-05 ENCOUNTER — Telehealth: Payer: Self-pay | Admitting: Physical Therapy

## 2019-08-05 NOTE — Telephone Encounter (Signed)
Contacted patient due to missed PT appointment. Patient stated he was not able to make it to therapy, he is still trying to get in contact with his PCP. He does not have any more visits scheduled, and wants to wait until he talks with his PCP before scheduling more visits. Patient informed of attendance policy and calling to schedule additional visits as needed.

## 2019-08-21 MED FILL — CARVEDILOL 12.5 MG TABLET: 12.5 | 90 days supply | Qty: 180 | Fill #1

## 2019-08-21 MED FILL — GABAPENTIN 300 MG CAPSULE: 300 | 30 days supply | Qty: 60 | Fill #3

## 2019-08-21 MED FILL — busPIRone HCL 10 MG TABS: 10 | 90 days supply | Qty: 180 | Fill #1

## 2019-08-28 MED FILL — GABAPENTIN 300 MG CAPSULE: 300 | 30 days supply | Qty: 60 | Fill #3

## 2019-08-28 MED FILL — CARVEDILOL 12.5 MG TABLET: 12.5 | 90 days supply | Qty: 180 | Fill #1

## 2019-08-28 MED FILL — METHOCARBAMOL 500 MG TABS: 500 | 30 days supply | Qty: 90 | Fill #1

## 2019-09-04 MED FILL — busPIRone HCL 10 MG TABS: 10 | 90 days supply | Qty: 180 | Fill #1

## 2019-09-27 MED FILL — busPIRone HCL 10 MG TABS: 10 | 90 days supply | Qty: 180 | Fill #1

## 2019-10-02 ENCOUNTER — Telehealth: Payer: Self-pay | Admitting: Interventional Cardiology

## 2019-10-02 NOTE — Telephone Encounter (Signed)
Called pt and left message informing him that he needed to contact his PCP Dr. Alvis Lemmings to inquire about his medicaiton Chantix, because Dr. Alvis Lemmings prescribed this medication. I informed pt also that is he has any other problems, questions or concerns, to give our office a call back.

## 2019-10-02 NOTE — Telephone Encounter (Signed)
New message:    Pt said [facility] [address] said they do not carry Chantix  1 mg single patck 56.any longer. [facility] does not have and the [facility] it [facility]. He wants to know where can he get it from?

## 2019-10-07 ENCOUNTER — Telehealth: Payer: Self-pay | Admitting: Family Medicine

## 2019-10-07 MED ORDER — BUPROPION HCL ER (XL) 150 MG PO TB24
150.0000 mg | ORAL_TABLET | Freq: Every day | ORAL | 3 refills | Status: DC
Start: 2019-10-07 — End: 2020-04-19

## 2019-10-07 NOTE — Telephone Encounter (Signed)
Medication is currently on recall.

## 2019-10-07 NOTE — Telephone Encounter (Signed)
Copied from CRM [id number]. Topic: General - Other >> Oct 02, 2019  4:07 PM Randol Kern wrote: Reason for CRM: Pt would like a call back from PCP or Nurse regarding his chantix 1mg , his pharmacy said this is on back order. He wants to know if there is an alternative medication.  Best contact: [phone]

## 2019-10-07 NOTE — Addendum Note (Signed)
Addended byHoy Register on: 10/07/2019 06:08 PM   Modules accepted: Orders

## 2019-10-07 NOTE — Telephone Encounter (Signed)
I have sent a prescription for bupropion to his pharmacy which will be the alternative.

## 2019-10-08 MED FILL — BUPROPION HCL ER (XL) 150 M: 150 | 30 days supply | Qty: 30 | Fill #0

## 2019-10-08 NOTE — Telephone Encounter (Signed)
Patient was called and informed of medication being sent to pharmacy. 

## 2019-10-11 MED FILL — traMADol HCL 50 MG TABS: 50 | 7 days supply | Qty: 14 | Fill #2

## 2019-10-21 ENCOUNTER — Other Ambulatory Visit: Payer: Self-pay | Admitting: Family Medicine

## 2019-10-21 ENCOUNTER — Ambulatory Visit: Payer: 59 | Attending: Family Medicine | Admitting: Family Medicine

## 2019-10-21 ENCOUNTER — Encounter: Payer: Self-pay | Admitting: Family Medicine

## 2019-10-21 ENCOUNTER — Other Ambulatory Visit: Payer: Self-pay

## 2019-10-21 DIAGNOSIS — M5441 Lumbago with sciatica, right side: Secondary | ICD-10-CM | POA: Diagnosis not present

## 2019-10-21 DIAGNOSIS — I255 Ischemic cardiomyopathy: Secondary | ICD-10-CM

## 2019-10-21 DIAGNOSIS — I25119 Atherosclerotic heart disease of native coronary artery with unspecified angina pectoris: Secondary | ICD-10-CM

## 2019-10-21 DIAGNOSIS — F4323 Adjustment disorder with mixed anxiety and depressed mood: Secondary | ICD-10-CM

## 2019-10-21 DIAGNOSIS — G8929 Other chronic pain: Secondary | ICD-10-CM

## 2019-10-21 MED ORDER — BUSPIRONE HCL 10 MG PO TABS: 10.0000 mg | ORAL_TABLET | Freq: Two times a day (BID) | ORAL | 1 refills | Status: DC

## 2019-10-21 MED ORDER — METHOCARBAMOL 500 MG PO TABS: 500.0000 mg | ORAL_TABLET | Freq: Three times a day (TID) | ORAL | 1 refills | Status: DC | PRN

## 2019-10-21 MED ORDER — GABAPENTIN 300 MG PO CAPS: 300.0000 mg | ORAL_CAPSULE | Freq: Two times a day (BID) | ORAL | 6 refills | Status: DC

## 2019-10-21 MED FILL — GABAPENTIN 300 MG CAPSULE: 300 | 30 days supply | Qty: 60 | Fill #0

## 2019-10-21 MED FILL — METHOCARBAMOL 500 MG TABS: 500 | 30 days supply | Qty: 90 | Fill #0

## 2019-10-21 NOTE — Progress Notes (Signed)
Virtual Visit via Telephone Note  I connected with Thomas Woods, on 10/21/2019 at 3:34 PM by telephone due to the COVID-19 pandemic and verified that I am speaking with the correct person using two identifiers.   Consent: I discussed the limitations, risks, security and privacy concerns of performing an evaluation and management service by telephone and the availability of in person appointments. I also discussed with the patient that there may be a patient responsible charge related to this service. The patient expressed understanding and agreed to proceed.   Location of Patient: Home  Location of Provider: Clinic   Persons participating in Telemedicine visit: Nate Common Farrington-CMA Dr. Alvis Lemmings     History of Present Illness: He is a 54 year old male with a history of hypertension, hyperlipidemia, ischemic cardiomyopathy (EF 50-55% in 2016), s/p overlapping LAD stents, and anxiety who presents today for a follow up visit.   He has had low back pain for over a year and has been out of work due to this. He stopped rehab as his insurance would not cover it and he could not afford it as he is not working. Therapy would work for a couple of hours and a couple of hours later pain would return. He does have numbness and tingling in his legs Medications help a bit and he is wondering if he can be out of work until he sees a Interior and spatial designer. 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  With regards to his cardiomyopathy he was last seen by cardiology in 02/2019 and recommendation was to follow-up in 1 year.    Past Medical History:  Diagnosis Date  . Hyperlipidemia LDL goal <70 03/27/2014  . Hypertension   . Ischemic cardiomyopathy 03/27/2014   EF 30% at cath, confirmed by echo  . Multiple lung nodules on CT 03/28/2014   Right lung nodules up to 6 mm, repeat CT in 6-12 months  . 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 NSTEMI (non-ST elevated myocardial infarction) (HCC) 03/26/2014   Patient pain increased and he developed ECG changes, taken to the cath lab as a STEMI, PCI to the LAD   No Known Allergies  Current Outpatient Medications on File Prior to Visit  Medication Sig Dispense Refill  . aspirin 81 MG tablet Take 1 tablet (81 mg total) by mouth daily. 30 tablet 3  . buPROPion (WELLBUTRIN XL) 150 MG 24 hr tablet Take 1 tablet (150 mg total) by mouth daily. 30 tablet 3  . busPIRone (BUSPAR) 10 MG tablet Take 1 tablet (10 mg total) by mouth 2 (two) times daily. 180 tablet 1  . carvedilol (COREG) 12.5 MG tablet TAKE 1 TABLET (12.5 MG TOTAL) BY MOUTH 2 (TWO) TIMES DAILY WITH A MEAL. 180 tablet 2  . clopidogrel (PLAVIX) 75 MG tablet TAKE 1 TABLET (75 MG TOTAL) BY MOUTH DAILY. 90 tablet 2  . gabapentin (NEURONTIN) 300 MG capsule Take 1 capsule (300 mg total) by mouth 2 (two) times daily. 60 capsule 3  . lisinopril (ZESTRIL) 5 MG tablet Take 1 tablet (5 mg total) by mouth daily. 90 tablet 3  . methocarbamol (ROBAXIN) 500 MG tablet TAKE 1 TABLET (500 MG TOTAL) BY MOUTH EVERY 8 (EIGHT) HOURS AS NEEDED FOR MUSCLE SPASMS. 90 tablet 1  . rosuvastatin (CRESTOR) 20 MG tablet Take 1 tablet (20 mg total) by mouth daily. 90 tablet 3  . sildenafil (REVATIO) 20 MG tablet TAKE 3 TO 5  TABLETS BY MOUTH ONCE DAILY AS NEEDED 1  HOUR  PRIOR  TO  SEXUAL  ACTIVITY 30 tablet 7  . traMADol (ULTRAM) 50 MG tablet Take 1 tablet (50 mg total) by mouth every 12 (twelve) hours as needed. 60 tablet 1  . nitroGLYCERIN (NITROSTAT) 0.4 MG SL tablet Place 1 tablet (0.4 mg total) under the tongue every 5 (five) minutes as needed for chest pain (x 3 doses). 25 tablet 3  . prednisoLONE acetate (PRED FORTE) 1 % ophthalmic suspension Apply 1 drop to eye daily. (Patient not taking: Reported on 10/21/2019)    . sildenafil (VIAGRA) 50 MG tablet TAKE 1 TABLET BY MOUTH DAILY AS NEEDED FOR ERECTILE DYSFUNCTION (Patient not taking: Reported on  03/20/2019) 30 tablet 3   No current facility-administered medications on file prior to visit.    Observations/Objective: Awake, alert, and 2x3 Not in acute distress  Assessment and Plan: 1. Adjustment disorder with mixed anxiety and depressed mood Stable - busPIRone (BUSPAR) 10 MG tablet; Take 1 tablet (10 mg total) by mouth 2 (two) times daily.  Dispense: 180 tablet; Refill: 1  2. Cardiomyopathy, ischemic EF of 50 to 55% from 2016 Euvolemic Follow-up with cardiology  3. Coronary artery disease involving native coronary artery of native heart with angina pectoris (HCC) No angina at this time Risk factor modification Follow-up with cardiology  4. Chronic right-sided low back pain with right-sided sciatica Uncontrolled PT with minimal relief Unable to continue PT due to finances We will order lumbar spine MRI and refer to spine surgery for possible corticosteroid injection Provided note for work for 1 month until seen by spine surgeon - gabapentin (NEURONTIN) 300 MG capsule; Take 1 capsule (300 mg total) by mouth 2 (two) times daily.  Dispense: 60 capsule; Refill: 6 - MR Lumbar Spine Wo Contrast; Future - methocarbamol (ROBAXIN) 500 MG tablet; Take 1 tablet (500 mg total) by mouth every 8 (eight) hours as needed for muscle spasms.  Dispense: 90 tablet; Refill: 1 - Ambulatory referral to Spine Surgery   Follow Up Instructions: 3 months for chronic disease management   I discussed the assessment and treatment plan with the patient. The patient was provided an opportunity to ask questions and all were answered. The patient agreed with the plan and demonstrated an understanding of the instructions.   The patient was advised to call back or seek an in-person evaluation if the symptoms worsen or if the condition fails to improve as anticipated.     I provided 19 minutes total of non-face-to-face time during this encounter including median intraservice time, reviewing previous  notes, investigations, ordering medications, medical decision making, coordinating care and patient verbalized understanding at the end of the visit.     Hoy Register, MD, FAAFP. [facility] [place], [place] [phone]   10/21/2019, 3:34 PM

## 2019-10-21 NOTE — Progress Notes (Signed)
Still having pain in back.  Seen PT but had to stop due to insurance, would like to know that next steps.

## 2019-10-22 MED FILL — traMADol HCL 50 MG TABS: 50 | 7 days supply | Qty: 14 | Fill #2

## 2019-10-22 MED FILL — ROSUVASTATIN CALCIUM 20 MG: 20 | 90 days supply | Qty: 90 | Fill #2

## 2019-10-29 NOTE — Progress Notes (Signed)
Cardiology Office Note   Date:  10/30/2019   ID:  Thomas Woods, DOB [birth year], MRN [id number]  PCP:  Thomas Register, MD    No chief complaint on file.  CAD/Old MI  Wt Readings from Last 3 Encounters:  10/30/19 210 lb (95.3 kg)  03/20/19 210 lb (95.3 kg)  03/06/19 208 lb (94.3 kg)       History of Present Illness: Thomas Woods is a 54 y.o. male  Who had an anterior MI in 3/16.Angina was a burning sensation.He had 2 long overlapping drug-eluting stents to the LAD. His EF was low at 30%. He had an apical thrombus, and was treated with warfarin for a few months. EF came back to low normalin 6/16.  He has tried patches and pills to stop smoking.He has been unsuccessful.  In late 2019, he had two eye surgeries for glaucoma in [place]. He was unable to exercise. No lifting was allowed.   He has some DOE. It occurs occasionally. He has some joint pains as well limiting his activity.   Denies : Chest pain. Dizziness. Leg edema. Nitroglycerin use. Orthopnea. Palpitations. Paroxysmal nocturnal dyspnea. Shortness of breath. Syncope.   Since the last visit, he has had some bloody sputum/spit.  He has been forgetting his evening dose of Coreg.  No fevers or chills.  No runny nose.     Past Medical History:  Diagnosis Date  . Hyperlipidemia LDL goal <70 03/27/2014  . Hypertension   . Ischemic cardiomyopathy 03/27/2014   EF 30% at cath, confirmed by echo  . Multiple lung nodules on CT 03/28/2014   Right lung nodules up to 6 mm, repeat CT in 6-12 months  . 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 NSTEMI (non-ST elevated myocardial infarction) (HCC) 03/26/2014   Patient pain increased and he developed ECG changes, taken to the cath lab as a STEMI, PCI to the LAD    Past Surgical History:  Procedure Laterality Date  . HERNIA REPAIR    . LEFT HEART CATHETERIZATION WITH CORONARY ANGIOGRAM N/A 03/27/2014   Procedure: LEFT HEART  CATHETERIZATION WITH CORONARY ANGIOGRAM;  Surgeon: Corky Crafts, MD; L main okay, LAD 100%, CFX okay, RI moderate disease, OM1 mild disease, RCA moderate disease, EF 30%  . PERCUTANEOUS CORONARY STENT INTERVENTION (PCI-S)  03/27/2014   3.0 x 32 Synergy overlapped with 2.5 x 38 Synergy to the mLAD     Current Outpatient Medications  Medication Sig Dispense Refill  . aspirin 81 MG tablet Take 1 tablet (81 mg total) by mouth daily. 30 tablet 3  . buPROPion (WELLBUTRIN XL) 150 MG 24 hr tablet Take 1 tablet (150 mg total) by mouth daily. 30 tablet 3  . busPIRone (BUSPAR) 10 MG tablet Take 1 tablet (10 mg total) by mouth 2 (two) times daily. 180 tablet 1  . carvedilol (COREG) 12.5 MG tablet TAKE 1 TABLET (12.5 MG TOTAL) BY MOUTH 2 (TWO) TIMES DAILY WITH A MEAL. 180 tablet 2  . clopidogrel (PLAVIX) 75 MG tablet TAKE 1 TABLET (75 MG TOTAL) BY MOUTH DAILY. 90 tablet 2  . gabapentin (NEURONTIN) 300 MG capsule Take 1 capsule (300 mg total) by mouth 2 (two) times daily. 60 capsule 6  . lisinopril (ZESTRIL) 5 MG tablet Take 1 tablet (5 mg total) by mouth daily. 90 tablet 3  . methocarbamol (ROBAXIN) 500 MG tablet Take 1 tablet (500 mg total) by mouth every 8 (eight) hours as needed for  muscle spasms. 90 tablet 1  . prednisoLONE acetate (PRED FORTE) 1 % ophthalmic suspension Apply 1 drop to eye daily.     . rosuvastatin (CRESTOR) 20 MG tablet Take 1 tablet (20 mg total) by mouth daily. 90 tablet 3  . sildenafil (REVATIO) 20 MG tablet TAKE 3 TO 5 TABLETS BY MOUTH ONCE DAILY AS NEEDED 1  HOUR  PRIOR  TO  SEXUAL  ACTIVITY 30 tablet 7  . sildenafil (VIAGRA) 50 MG tablet TAKE 1 TABLET BY MOUTH DAILY AS NEEDED FOR ERECTILE DYSFUNCTION 30 tablet 3  . traMADol (ULTRAM) 50 MG tablet Take 1 tablet (50 mg total) by mouth every 12 (twelve) hours as needed. 60 tablet 1  . nitroGLYCERIN (NITROSTAT) 0.4 MG SL tablet Place 1 tablet (0.4 mg total) under the tongue every 5 (five) minutes as needed for chest pain (x 3  doses). 25 tablet 3   No current facility-administered medications for this visit.    Allergies:   Patient has no known allergies.    Social History:  The patient  reports that he has been smoking cigarettes. He has been smoking about 0.20 packs per day. He has never used smokeless tobacco. He reports current alcohol use. He reports that he does not use drugs.   Family History:  The patient's family history includes Healthy in his brother, mother, and sister.    ROS:  Please see the history of present illness.   Otherwise, review of systems are positive for some blood in his spit/sputum.   All other systems are reviewed and negative.    PHYSICAL EXAM: VS:  BP (!) 146/92   Pulse 88   Ht 6' (1.829 m)   Wt 210 lb (95.3 kg)   SpO2 98%   BMI 28.48 kg/m  , BMI Body mass index is 28.48 kg/m. GEN: Well nourished, well developed, in no acute distress  HEENT: normal  Neck: no JVD, carotid bruits, or masses Cardiac: RRR; no murmurs, rubs, or gallops,no edema  Respiratory:  clear to auscultation bilaterally, normal work of breathing GI: soft, nontender, nondistended, + BS MS: no deformity or atrophy  Skin: warm and dry, no rash Neuro:  Strength and sensation are intact Psych: euthymic mood, full affect   EKG:   The ekg ordered 2/21 demonstrates NSR, no ST changes   Recent Labs: 03/20/2019: Hemoglobin 13.6; Platelets 194; TSH 0.725 06/04/2019: ALT 28; BUN 13; Creatinine, Ser 0.87; Potassium 3.7; Sodium 139   Lipid Panel    Component Value Date/Time   CHOL 164 06/04/2019 1026   TRIG 58 06/04/2019 1026   HDL 75 06/04/2019 1026   CHOLHDL 2.2 06/04/2019 1026   CHOLHDL 2.1 12/07/2015 1053   VLDL 8 12/07/2015 1053   LDLCALC 77 06/04/2019 1026     Other studies Reviewed: Additional studies/ records that were reviewed today with results demonstrating: labs reviewed.     ASSESSMENT AND PLAN:  1. CAD/Old MI: Continue aggressive secondary prevention.  Continue Plavix.  Stop  aspirin.  2. Hyperlipidemia: Whole food, plant-based diet recommended. Continue statin. LDL 77 in 5/21. 3. HTN: Low-salt diet.  Coreg - encouraged him to take second dose.  4. Tobacco abuse: He needs to stop smoking. He is down to one cigarette a day.  Using Buproprion since Chantix was recalled.  OK to use Tylenol for occasional headaches. 5. Erectile dysfunction:  6. I am ok for him to go to get his COVID vaccines.  Will order chest xray due to some random hemoptysis and  smoking history.  No COVID sx at this time.  He agrees to get the vaccine given his heart problems.    Current medicines are reviewed at length with the patient today.  The patient concerns regarding his medicines were addressed.  The following changes have been made:  No change  Labs/ tests ordered today include:  No orders of the defined types were placed in this encounter.   Recommend 150 minutes/week of aerobic exercise Low fat, low carb, high fiber diet recommended  Disposition:   FU in 1 year   Signed, Lance Muss, MD  10/30/2019 2:35 PM    [facility] [address] [place], [place], [place]  [address] Phone: [phone]; Fax: [phone]

## 2019-10-30 ENCOUNTER — Other Ambulatory Visit: Payer: Self-pay

## 2019-10-30 ENCOUNTER — Ambulatory Visit (INDEPENDENT_AMBULATORY_CARE_PROVIDER_SITE_OTHER): Payer: 59 | Admitting: Interventional Cardiology

## 2019-10-30 ENCOUNTER — Encounter: Payer: Self-pay | Admitting: Interventional Cardiology

## 2019-10-30 VITALS — BP 146/92 | HR 88 | Ht 72.0 in | Wt 210.0 lb

## 2019-10-30 DIAGNOSIS — I252 Old myocardial infarction: Secondary | ICD-10-CM

## 2019-10-30 DIAGNOSIS — I255 Ischemic cardiomyopathy: Secondary | ICD-10-CM

## 2019-10-30 DIAGNOSIS — E782 Mixed hyperlipidemia: Secondary | ICD-10-CM | POA: Diagnosis not present

## 2019-10-30 DIAGNOSIS — I25119 Atherosclerotic heart disease of native coronary artery with unspecified angina pectoris: Secondary | ICD-10-CM | POA: Diagnosis not present

## 2019-10-30 DIAGNOSIS — I10 Essential (primary) hypertension: Secondary | ICD-10-CM

## 2019-10-30 DIAGNOSIS — Z7185 Encounter for immunization safety counseling: Secondary | ICD-10-CM

## 2019-10-30 DIAGNOSIS — Z720 Tobacco use: Secondary | ICD-10-CM

## 2019-10-30 DIAGNOSIS — R042 Hemoptysis: Secondary | ICD-10-CM

## 2019-10-30 NOTE — Patient Instructions (Addendum)
Medication Instructions:  Your physician has recommended you make the following change in your medication:   STOP: aspirin  *If you need a refill on your cardiac medications before your next appointment, please call your pharmacy*   Lab Work: None  If you have labs (blood work) drawn today and your tests are completely normal, you will receive your results only by: Marland Kitchen MyChart Message (if you have MyChart) OR . A paper copy in the mail If you have any lab test that is abnormal or we need to change your treatment, we will call you to review the results.   Testing/Procedures: A chest x-ray takes a picture of the organs and structures inside the chest, including the heart, lungs, and blood vessels. This test can show several things, including, whether the heart is enlarges; whether fluid is building up in the lungs; and whether pacemaker / defibrillator leads are still in place.  Chest X-ray Instructions:    1. You may have this done at the [facility], located in the        [facility] Building on the 1st floor.    2. You do no have to have an appointment.    3. [address] [place], [place] [address]        [phone]        Monday - Friday  8:00 am - 5:00 pm    Follow-Up: At [facility], you and your health needs are our priority.  As part of our continuing mission to provide you with exceptional heart care, we have created designated Provider Care Teams.  These Care Teams include your primary Cardiologist (physician) and Advanced Practice Providers (APPs -  Physician Assistants and Nurse Practitioners) who all work together to provide you with the care you need, when you need it.  We recommend signing up for the patient portal called "MyChart".  Sign up information is provided on this After Visit Summary.  MyChart is used to connect with patients for Virtual Visits (Telemedicine).  Patients are able to view lab/test results, encounter notes,  upcoming appointments, etc.  Non-urgent messages can be sent to your provider as well.   To learn more about what you can do with MyChart, go to [email or web address].    Your next appointment:   6 month(s)  The format for your next appointment:   In Person  Provider:   You may see Lance Muss, MD or one of the following Advanced Practice Providers on your designated Care Team:    Ronie Spies, PA-C  Jacolyn Reedy, PA-C    Other Instructions None

## 2019-10-30 NOTE — Addendum Note (Signed)
Addended by: Daleen Bo I on: 10/30/2019 04:12 PM   Modules accepted: Orders

## 2019-10-31 ENCOUNTER — Ambulatory Visit
Admission: RE | Admit: 2019-10-31 | Discharge: 2019-10-31 | Disposition: A | Discharge status: Home / Self Care | Payer: 59 | Source: Ambulatory Visit | Attending: Interventional Cardiology | Admitting: Interventional Cardiology

## 2019-10-31 DIAGNOSIS — R042 Hemoptysis: Secondary | ICD-10-CM

## 2019-11-02 ENCOUNTER — Ambulatory Visit (HOSPITAL_COMMUNITY)
Admission: RE | Admit: 2019-11-02 | Discharge: 2019-11-02 | Disposition: A | Discharge status: Home / Self Care | Payer: 59 | Source: Ambulatory Visit | Attending: Family Medicine | Admitting: Family Medicine

## 2019-11-02 ENCOUNTER — Other Ambulatory Visit: Payer: Self-pay

## 2019-11-02 DIAGNOSIS — M5441 Lumbago with sciatica, right side: Secondary | ICD-10-CM | POA: Insufficient documentation

## 2019-11-02 DIAGNOSIS — G8929 Other chronic pain: Secondary | ICD-10-CM | POA: Diagnosis present

## 2019-11-04 ENCOUNTER — Telehealth: Payer: Self-pay

## 2019-11-04 NOTE — Telephone Encounter (Signed)
Patient name and DOB has been verified Patient was informed of lab results. Patient had no questions.  

## 2019-11-04 NOTE — Telephone Encounter (Signed)
-----   Message from Marcine Matar, MD sent at 11/03/2019  9:38 AM EDT ----- Let pt know that MRI of lumbar spine revealed moderate to severe narrowing of the bone (vertebrae) where nerve exits the spine at L5-S1 level on both sides.  This is being caused by disc bulge and arthritis.  Dr. Alvis Lemmings has referred him to a spine specialist for further evaluation and management.

## 2019-11-12 ENCOUNTER — Other Ambulatory Visit: Payer: Self-pay | Admitting: Family Medicine

## 2019-11-12 DIAGNOSIS — M5441 Lumbago with sciatica, right side: Secondary | ICD-10-CM

## 2019-11-12 DIAGNOSIS — G8929 Other chronic pain: Secondary | ICD-10-CM

## 2019-11-12 MED FILL — BUPROPION HCL XL 150 MG TAB: 150 | 30 days supply | Qty: 30 | Fill #1

## 2019-11-12 MED FILL — CLOPIDOGREL 75 MG TABLET: 75 | 90 days supply | Qty: 90 | Fill #2

## 2019-11-12 NOTE — Telephone Encounter (Signed)
Requested medication (s) are due for refill today: yes  Requested medication (s) are on the active medication list: yes  Last refill: 04/25/19  #60  1 refill  Future visit scheduled yes 02/04/19  Notes to clinic: not delegated  Requested Prescriptions  Pending Prescriptions Disp Refills   traMADol (ULTRAM) 50 MG tablet [Pharmacy Med Name: traMADol HCL 50 MG TABS 50 Tablet] 14 tablet 1    Sig: Take 1 tablet (50 mg total) by mouth every 12 (twelve) hours as needed.      Not Delegated - Analgesics:  Opioid Agonists Failed - 11/12/2019  4:07 PM      Failed - This refill cannot be delegated      Failed - Urine Drug Screen completed in last 360 days      Passed - Valid encounter within last 6 months    Recent Outpatient Visits           3 weeks ago Cardiomyopathy, ischemic   [facility] [place], [place], MD   6 months ago Chronic right-sided low back pain with right-sided sciatica   [facility] Hoy Register, MD   7 months ago Other fatigue   [place] [place], Odette Horns, MD   1 year ago Coronary artery disease involving native coronary artery of native heart with angina pectoris [facility])   [facility] Hoy Register, MD   1 year ago Sciatica of right side    [facility] Hoy Register, MD       Future Appointments             In 2 months Hoy Register, MD [facility]

## 2019-11-13 ENCOUNTER — Ambulatory Visit: Payer: 59 | Admitting: Interventional Cardiology

## 2019-11-13 MED FILL — traMADol HCL 50 MG TABS: 50 | 7 days supply | Qty: 14 | Fill #0

## 2019-11-25 DIAGNOSIS — R030 Elevated blood-pressure reading, without diagnosis of hypertension: Secondary | ICD-10-CM | POA: Insufficient documentation

## 2019-11-25 DIAGNOSIS — M5126 Other intervertebral disc displacement, lumbar region: Secondary | ICD-10-CM | POA: Insufficient documentation

## 2019-11-25 MED FILL — METHYLPREDNISOLONE 4 MG TAB: 4 | 6 days supply | Qty: 21 | Fill #0

## 2019-12-12 ENCOUNTER — Other Ambulatory Visit: Payer: Self-pay | Admitting: Internal Medicine

## 2019-12-12 DIAGNOSIS — F4323 Adjustment disorder with mixed anxiety and depressed mood: Secondary | ICD-10-CM

## 2019-12-12 MED FILL — traMADol HCL 50 MG TABS: 50 | 7 days supply | Qty: 14 | Fill #1

## 2019-12-12 MED FILL — BUPROPION HCL XL 150 MG TAB: 150 | 30 days supply | Qty: 30 | Fill #2

## 2019-12-19 MED FILL — BUPROPION HCL XL 150 MG TAB: 150 | 30 days supply | Qty: 30 | Fill #2

## 2019-12-19 MED FILL — traMADol HCL 50 MG TABS: 50 | 7 days supply | Qty: 14 | Fill #1

## 2019-12-24 DIAGNOSIS — Z6829 Body mass index (BMI) 29.0-29.9, adult: Secondary | ICD-10-CM | POA: Insufficient documentation

## 2019-12-26 ENCOUNTER — Telehealth: Payer: Self-pay | Admitting: Family Medicine

## 2019-12-26 NOTE — Telephone Encounter (Signed)
Copied from CRM [id number]. Topic: General - Other >> Dec 26, 2019 12:33 PM Dalphine Handing A wrote: Patient would like a callback from [place]. Patient stated that his spine specialist advised him to quit taking the medication but patient wants to know if Dr .Alvis Lemmings would advise he do this as well and if it will jeopardize his health by stopping taking the medication. Please advise

## 2019-12-26 NOTE — Telephone Encounter (Signed)
Pt was called to get more information as to why he has to stop the medication.

## 2019-12-26 NOTE — Telephone Encounter (Signed)
Copied from CRM [id number]. Topic: General - Other >> Dec 25, 2019 12:58 PM Lyn Hollingshead D wrote: PT need to speak with a nurse / Spine Specials suggest he stop taking this medication for 10 days /  clopidogrel (PLAVIX) 75 MG tablet [[id number]] / pleas advise

## 2019-12-26 NOTE — Telephone Encounter (Signed)
Pt was informed to stop Plavix medication pt wants to know if he should stop the medication.

## 2019-12-26 NOTE — Telephone Encounter (Signed)
If the back Specialist is panning a procedure he would typically be advised to hold Plavix for 5 days which is okay. Plavix was prescribed by his Cardiologist Dr Eldridge Dace and I would need him to check with Cardiologist.

## 2019-12-27 ENCOUNTER — Telehealth: Payer: Self-pay | Admitting: Interventional Cardiology

## 2019-12-27 NOTE — Telephone Encounter (Signed)
Patient calling due to having shot put in back and wants to know if he should stop taking Plavix. Was told he has to ask doctor's office to send in medical clearance form so the doctor can properly document and look over it

## 2019-12-27 NOTE — Telephone Encounter (Signed)
Pt was called and informed to contact cardiologist

## 2019-12-27 NOTE — Telephone Encounter (Signed)
Left message for the pt that the surgeon's office will need to fax over a clearance request in order for our office to be able to proceed with his cardiac clearance. Once clearance request has been received , our pre op team will assess for cardiac clearance.

## 2019-12-31 NOTE — Telephone Encounter (Signed)
FYI-- Patient is following up regarding clearance request. He states he received the message from CMA regarding our protocol for medical clearance. He states he will either call our office back with the requesting office's phone number or he will have said office contact us.

## 2020-01-02 NOTE — Telephone Encounter (Signed)
I called the Thomas Woods and informed him that we still yet to receive a clearance request. I asked who was performing his procedure and I will be happy to call the doctors office for the request. Thomas Woods states Dr. Franky Macho with [facility] # [phone]. I assured the Thomas Woods that I will attempt to get the request faxed to our office. Thomas Woods states his procedure is scheduled for 01/21/20.  I then called Dr. Sueanne Margarita office and left a message for [facility] surgery scheduler covering for Shanda Bumps, scheduler (who is off for the holiday) for Dr. Franky Macho. Left message our office will need a clearance request to be faxed to our office fax # [phone]. If any questions call [phone]. We will not be able to clear the Thomas Woods until clearance request is received for pre op teams review.

## 2020-01-02 NOTE — Telephone Encounter (Signed)
Nikki from Dr. Sueanne Margarita office states the patient has an appointment with a PA on 01/21/2020 at their office has an appointment with Dr. Franky Macho 01/28/2020, but no procedure is scheduled. She states there is no clearance required yet.

## 2020-01-21 ENCOUNTER — Telehealth: Payer: Self-pay

## 2020-01-21 DIAGNOSIS — M5416 Radiculopathy, lumbar region: Secondary | ICD-10-CM | POA: Insufficient documentation

## 2020-01-21 NOTE — Telephone Encounter (Signed)
Dr. Eldridge Dace, please review to see if Mr. Storr can hold plavix for 7 days prior to back injection.  He has a history of anterior MI in 2016 with resultant ischemic cardiomyopathy and LV thrombus.  He was on Coumadin for some time however Coumadin later was discontinued as his EF normalized and LV thrombus resolved.  He was last seen in the cardiology office on 10/30/2019.  Dr. Eldridge Dace, please forward your response to P CV DIV PREOP

## 2020-01-21 NOTE — Telephone Encounter (Signed)
   [facility] Pre-operative Risk Assessment    HEARTCARE STAFF: - Please ensure there is not already an duplicate clearance open for this procedure. - Under Visit Info/Reason for Call, type in Other and utilize the format Clearance MM/DD/YY or Clearance TBD. Do not use dashes or single digits. - If request is for dental extraction, please clarify the # of teeth to be extracted.  Request for surgical clearance:  1. What type of surgery is being performed? Lumbar Spine L5-S1  2. When is this surgery scheduled? 02/06/20   3. What type of clearance is required (medical clearance vs. Pharmacy clearance to hold med vs. Both)? Both   4. Are there any medications that need to be held prior to surgery and how long?Yes, Plavix, 7days   5. Practice name and name of physician performing surgery? [facility], Lenord Carbo, MD    6. What is the office phone number? [phone] x [phone]    7.   What is the office fax number? [phone]  8.   Anesthesia type (None, local, MAC, general) ? None listed after hours   Thomas Woods Thomas Woods 01/21/2020, 5:31 PM  _________________________________________________________________

## 2020-01-22 NOTE — Telephone Encounter (Signed)
Left message for the patient to call back and speak to the on call preop APP 

## 2020-01-22 NOTE — Telephone Encounter (Signed)
OK to hold plavix 7 days prior to back injection  JV

## 2020-01-23 NOTE — Telephone Encounter (Signed)
Thomas Woods is returning Thomas Woods's call. Please advise.

## 2020-01-23 NOTE — Telephone Encounter (Signed)
   Primary Cardiologist: Lance Muss, MD  Chart reviewed as part of pre-operative protocol coverage. Patient was contacted 01/23/2020 in reference to pre-operative risk assessment for pending surgery as outlined below.  Thomas Woods was last seen on 10/30/2019 by Dr. Eldridge Dace.  Since that day, Thomas Woods has done well without significant chest pain or shortness of breath.  Therefore, based on ACC/AHA guidelines, the patient would be at acceptable risk for the planned procedure without further cardiovascular testing.   He may hold Plavix for 7 days prior to the procedure, we will defer the earliest restarting time after the procedure to the surgeon based on the bleeding risk.  The patient was advised that if he develops new symptoms prior to surgery to contact our office to arrange for a follow-up visit, and he verbalized understanding.  I will route this recommendation to the requesting party via Epic fax function and remove from pre-op pool. Please call with questions.  [place], [place] 01/23/2020, 12:31 PM

## 2020-01-24 ENCOUNTER — Other Ambulatory Visit: Payer: Self-pay | Admitting: Family Medicine

## 2020-01-24 DIAGNOSIS — G8929 Other chronic pain: Secondary | ICD-10-CM

## 2020-01-24 MED FILL — buPROPion HCL ER (XL) 150 M: 150 | 30 days supply | Qty: 30 | Fill #0

## 2020-01-24 MED FILL — GABAPENTIN 300 MG CAPSULE: 300 | 30 days supply | Qty: 60 | Fill #0

## 2020-01-24 NOTE — Telephone Encounter (Signed)
Please refax the clearance to the surgeon's office and call them afterward to verify they have received it. Not sure why the surgeon's office did not receive the clearance I sent them yesterday

## 2020-01-24 NOTE — Telephone Encounter (Signed)
Requested medication (s) are due for refill today: yes  Requested medication (s) are on the active medication list:yes  Last refill:  11/13/19  #14  1 refill  Future visit scheduled: yes  Notes to clinic:  Not delegated    Requested Prescriptions  Pending Prescriptions Disp Refills   traMADol (ULTRAM) 50 MG tablet [Pharmacy Med Name: traMADol HCL 50 MG TABS 50 Tablet] 14 tablet 1    Sig: TAKE 1 TABLET (50 MG TOTAL) BY MOUTH EVERY 12 (TWELVE) HOURS AS NEEDED.      Not Delegated - Analgesics:  Opioid Agonists Failed - 01/24/2020 10:49 AM      Failed - This refill cannot be delegated      Failed - Urine Drug Screen completed in last 360 days      Passed - Valid encounter within last 6 months    Recent Outpatient Visits           3 months ago Cardiomyopathy, ischemic   [facility] Alfarata, [place], MD   9 months ago Chronic right-sided low back pain with right-sided sciatica   [facility] Hoy Register, MD   10 months ago Other fatigue   [facility] [place], Odette Horns, MD   1 year ago Coronary artery disease involving native coronary artery of native heart with angina pectoris [facility])   [facility] Hoy Register, MD   1 year ago Sciatica of right side   [facility] Hoy Register, MD       Future Appointments             In 1 week Hoy Register, MD [facility]

## 2020-01-24 NOTE — Telephone Encounter (Signed)
Clearance form manually faxed to [facility]

## 2020-01-24 NOTE — Telephone Encounter (Signed)
Thomas Woods with [facility] is following up regarding the clearance request. She states their office has not received the recommendation and she would like to know if we can fax it again.   Phone #: [phone]  Fax #: [phone] Alternate Fax #: [phone]

## 2020-01-27 MED FILL — traMADol HCL 50 MG TABS: 50 | 20 days supply | Qty: 40 | Fill #0

## 2020-02-04 ENCOUNTER — Ambulatory Visit: Payer: 59 | Admitting: Family Medicine

## 2020-02-11 ENCOUNTER — Telehealth: Payer: Self-pay | Admitting: Family Medicine

## 2020-02-11 NOTE — Telephone Encounter (Signed)
Pt has an appointment on 02/12/20 to discuss matter. 

## 2020-02-11 NOTE — Telephone Encounter (Signed)
Copied from CRM [id number]. Topic: General - Other >> Feb 10, 2020  4:27 PM Tamela Oddi wrote: Reason for CRM: Patient would like the nurse or doctor to call him in regards to his back pain and medication.  Patient has already schedule an appt. In March but would also like the nurse or doctor to call him asap.  CB# [phone]

## 2020-02-12 ENCOUNTER — Ambulatory Visit: Payer: 59 | Attending: Family Medicine | Admitting: Family Medicine

## 2020-02-12 ENCOUNTER — Other Ambulatory Visit: Payer: Self-pay

## 2020-02-12 ENCOUNTER — Encounter: Payer: Self-pay | Admitting: Family Medicine

## 2020-02-12 VITALS — BP 180/108 | HR 83 | Ht 72.0 in | Wt 217.4 lb

## 2020-02-12 DIAGNOSIS — I5042 Chronic combined systolic (congestive) and diastolic (congestive) heart failure: Secondary | ICD-10-CM

## 2020-02-12 DIAGNOSIS — Z1159 Encounter for screening for other viral diseases: Secondary | ICD-10-CM | POA: Diagnosis not present

## 2020-02-12 DIAGNOSIS — I110 Hypertensive heart disease with heart failure: Secondary | ICD-10-CM

## 2020-02-12 DIAGNOSIS — G8929 Other chronic pain: Secondary | ICD-10-CM

## 2020-02-12 DIAGNOSIS — I255 Ischemic cardiomyopathy: Secondary | ICD-10-CM | POA: Diagnosis not present

## 2020-02-12 DIAGNOSIS — M5441 Lumbago with sciatica, right side: Secondary | ICD-10-CM

## 2020-02-12 MED ORDER — TRAMADOL HCL 50 MG PO TABS: 50.0000 mg | ORAL_TABLET | Freq: Two times a day (BID) | ORAL | 1 refills | Status: DC | PRN

## 2020-02-12 MED ORDER — LISINOPRIL 5 MG PO TABS: 5.0000 mg | ORAL_TABLET | Freq: Every day | ORAL | 1 refills | Status: DC

## 2020-02-12 NOTE — Progress Notes (Signed)
Subjective:  Patient ID: Thomas Woods, male    DOB: Jun 29, 1965  Age: 55 y.o. MRN: [id number]  CC: Hypertension   HPI Thomas Woods is a 55 year old male with a history of hypertension, hyperlipidemia, ischemic cardiomyopathy (EF 50-55% in 2016), s/p overlapping LAD stents, and anxiety who presents today for a follow up visit.  He had an epidural spinal injection 2 days ago for his chronic low back pain and is yet to feel the effect. Needs a new prescription for tramadol.  He has been put out of work by his Writer for now.  His BP is elevated and he just picked up his antihypertensive. He took his Coreg but not Lisinopril as he stopped Lisinopril 'because he read up that Lisinopril is not good for African-Americans'. He has no chest pain, dyspnea, pedal edema.  Past Medical History:  Diagnosis Date  . Hyperlipidemia LDL goal <70 03/27/2014  . Hypertension   . Ischemic cardiomyopathy 03/27/2014   EF 30% at cath, confirmed by echo  . Multiple lung nodules on CT 03/28/2014   Right lung nodules up to 6 mm, repeat CT in 6-12 months  . 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 NSTEMI (non-ST elevated myocardial infarction) (HCC) 03/26/2014   Patient pain increased and he developed ECG changes, taken to the cath lab as a STEMI, PCI to the LAD    Past Surgical History:  Procedure Laterality Date  . HERNIA REPAIR    . LEFT HEART CATHETERIZATION WITH CORONARY ANGIOGRAM N/A 03/27/2014   Procedure: LEFT HEART CATHETERIZATION WITH CORONARY ANGIOGRAM;  Surgeon: Corky Crafts, MD; L main okay, LAD 100%, CFX okay, RI moderate disease, OM1 mild disease, RCA moderate disease, EF 30%  . PERCUTANEOUS CORONARY STENT INTERVENTION (PCI-S)  03/27/2014   3.0 x 32 Synergy overlapped with 2.5 x 38 Synergy to the mLAD    Family History  Problem Relation Age of Onset  . Healthy Mother   . Healthy Sister   . Healthy Brother   . Heart attack Neg Hx   .  Hypertension Neg Hx   . Stroke Neg Hx     No Known Allergies  Outpatient Medications Prior to Visit  Medication Sig Dispense Refill  . buPROPion (WELLBUTRIN XL) 150 MG 24 hr tablet Take 1 tablet (150 mg total) by mouth daily. 30 tablet 3  . busPIRone (BUSPAR) 10 MG tablet Take 1 tablet (10 mg total) by mouth 2 (two) times daily. 180 tablet 1  . carvedilol (COREG) 12.5 MG tablet TAKE 1 TABLET (12.5 MG TOTAL) BY MOUTH 2 (TWO) TIMES DAILY WITH A MEAL. 180 tablet 2  . clopidogrel (PLAVIX) 75 MG tablet TAKE 1 TABLET (75 MG TOTAL) BY MOUTH DAILY. 90 tablet 2  . gabapentin (NEURONTIN) 300 MG capsule Take 1 capsule (300 mg total) by mouth 2 (two) times daily. 60 capsule 6  . methocarbamol (ROBAXIN) 500 MG tablet Take 1 tablet (500 mg total) by mouth every 8 (eight) hours as needed for muscle spasms. 90 tablet 1  . prednisoLONE acetate (PRED FORTE) 1 % ophthalmic suspension Apply 1 drop to eye daily.     . rosuvastatin (CRESTOR) 20 MG tablet Take 1 tablet (20 mg total) by mouth daily. 90 tablet 3  . sildenafil (REVATIO) 20 MG tablet TAKE 3 TO 5 TABLETS BY MOUTH ONCE DAILY AS NEEDED 1  HOUR  PRIOR  TO  SEXUAL  ACTIVITY 30 tablet 7  . lisinopril (ZESTRIL) 5  MG tablet Take 1 tablet (5 mg total) by mouth daily. 90 tablet 3  . traMADol (ULTRAM) 50 MG tablet TAKE 1 TABLET (50 MG TOTAL) BY MOUTH EVERY 12 (TWELVE) HOURS AS NEEDED. 40 tablet 1  . nitroGLYCERIN (NITROSTAT) 0.4 MG SL tablet Place 1 tablet (0.4 mg total) under the tongue every 5 (five) minutes as needed for chest pain (x 3 doses). 25 tablet 3  . sildenafil (VIAGRA) 50 MG tablet TAKE 1 TABLET BY MOUTH DAILY AS NEEDED FOR ERECTILE DYSFUNCTION (Patient not taking: Reported on 02/12/2020) 30 tablet 3   No facility-administered medications prior to visit.     ROS Review of Systems  Constitutional: Negative for activity change and appetite change.  HENT: Negative for sinus pressure and sore throat.   Eyes: Negative for visual disturbance.   Respiratory: Negative for cough, chest tightness and shortness of breath.   Cardiovascular: Negative for chest pain and leg swelling.  Gastrointestinal: Negative for abdominal distention, abdominal pain, constipation and diarrhea.  Endocrine: Negative.   Genitourinary: Negative for dysuria.  Musculoskeletal: Negative for joint swelling and myalgias.  Skin: Negative for rash.  Allergic/Immunologic: Negative.   Neurological: Negative for weakness, light-headedness and numbness.  Psychiatric/Behavioral: Negative for dysphoric mood and suicidal ideas.    Objective:  BP (!) 180/108   Pulse 83   Ht 6' (1.829 m)   Wt 217 lb 6.4 oz (98.6 kg)   SpO2 100%   BMI 29.48 kg/m   BP/Weight 02/12/2020 10/30/2019 03/20/2019  Systolic BP 180 146 157  Diastolic BP 108 92 82  Wt. (Lbs) 217.4 210 210  BMI 29.48 28.48 28.48      Physical Exam Constitutional:      Appearance: He is well-developed.  Neck:     Vascular: No JVD.  Cardiovascular:     Rate and Rhythm: Normal rate.     Heart sounds: Normal heart sounds. No murmur heard.   Pulmonary:     Effort: Pulmonary effort is normal.     Breath sounds: Normal breath sounds. No wheezing or rales.  Chest:     Chest wall: No tenderness.  Abdominal:     General: Bowel sounds are normal. There is no distension.     Palpations: Abdomen is soft. There is no mass.     Tenderness: There is no abdominal tenderness.  Musculoskeletal:     Right lower leg: No edema.     Left lower leg: No edema.     Comments: TTP lumbar spine Straight leg raise is positive bilaterally  Neurological:     Mental Status: He is alert and oriented to person, place, and time.  Psychiatric:        Mood and Affect: Mood normal.     CMP Latest Ref Rng & Units 06/04/2019 03/20/2019 01/26/2018  Glucose 65 - 99 mg/dL 80 81 88  BUN 6 - 24 mg/dL 13 12 10   Creatinine 0.76 - 1.27 mg/dL 6.30 1.60  Sodium 134 - 144 mmol/L 139 137 141  Potassium 3.5 - 5.2 mmol/L 3.7 4.6  4.3  Chloride 96 - 106 mmol/L 102 101 101  CO2 20 - 29 mmol/L 21 23 24   Calcium 8.7 - 10.2 mg/dL 9.5 9.5 9.7  Total Protein 6.0 - 8.5 g/dL 7.5 7.4 7.5  Total Bilirubin 0.0 - 1.2 mg/dL 0.8 0.8 0.7  Alkaline Phos 48 - 121 IU/L 46(L) 50 48  AST 0 - 40 IU/L 34 28 18  ALT 0 - 44 IU/L 28 29 12  Lipid Panel     Component Value Date/Time   CHOL 164 06/04/2019 1026   TRIG 58 06/04/2019 1026   HDL 75 06/04/2019 1026   CHOLHDL 2.2 06/04/2019 1026   CHOLHDL 2.1 12/07/2015 1053   VLDL 8 12/07/2015 1053   LDLCALC 77 06/04/2019 1026    CBC    Component Value Date/Time   WBC 4.4 03/20/2019 1044   WBC 6.0 04/02/2014 0356   RBC 4.22 03/20/2019 1044   RBC 4.68 04/02/2014 0356   HGB 13.6 03/20/2019 1044   HCT 39.8 03/20/2019 1044   PLT 194 03/20/2019 1044   MCV 94 03/20/2019 1044   MCH 32.2 03/20/2019 1044   MCH 31.2 04/02/2014 0356   MCHC 34.2 03/20/2019 1044   MCHC 34.0 04/02/2014 0356   RDW 13.2 03/20/2019 1044   LYMPHSABS 2.3 03/20/2019 1044   EOSABS 0.2 03/20/2019 1044   BASOSABS 0.0 03/20/2019 1044    No results found for: HGBA1C  Assessment & Plan:  1. Chronic right-sided low back pain with right-sided sciatica Uncontrolled Status post epidural spinal injection He will be following up with his neurosurgeon - traMADol (ULTRAM) 50 MG tablet; Take 1 tablet (50 mg total) by mouth every 12 (twelve) hours as needed.  Dispense: 40 tablet; Refill: 1  2.  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 Will see the Pharm.D. in 2  weeks to reassess his blood pressure and if still elevated consider increasing lisinopril to 10 mg Counseled on blood pressure goal of less than 130/80, low-sodium, DASH diet, medication compliance, 150 minutes of moderate intensity exercise per week. Discussed medication compliance, adverse effects. - Basic Metabolic Panel - lisinopril (ZESTRIL) 5 MG tablet; Take 1 tablet (5 mg total) by mouth daily.  Dispense: 90 tablet; Refill: 1  3.  Ischemic cardiomyopathy Stable EF of 55 to 60% Risk factor modification Follow-up with cardiology  4. Need for hepatitis C screening test - HCV RNA quant rflx ultra or genotyp([facility]/Sun[facility])    Meds ordered this encounter  Medications  . traMADol (ULTRAM) 50 MG tablet    Sig: Take 1 tablet (50 mg total) by mouth every 12 (twelve) hours as needed.    Dispense:  40 tablet    Refill:  1  . lisinopril (ZESTRIL) 5 MG tablet    Sig: Take 1 tablet (5 mg total) by mouth daily.    Dispense:  90 tablet    Refill:  1    Follow-up: Return in about 2 weeks (around 02/26/2020) for BP evaluation with Franky Macho; PCP 3 months.Hoy Register, MD, FAAFP. [facility] [place], [place] [phone]   02/12/2020, 3:52 PM

## 2020-02-12 NOTE — Patient Instructions (Signed)
https://doi.[email or web address]/[email or web address]">  Chronic Back Pain When back pain lasts longer than 3 months, it is called chronic back pain. The cause of your back pain may not be known. Some common causes include:  Wear and tear (degenerative disease) of the bones, ligaments, or disks in your back.  Inflammation and stiffness in your back (arthritis). People who have chronic back pain often go through certain periods in which the pain is more intense (flare-ups). Many people can learn to manage the pain with home care. Follow these instructions at home: Pay attention to any changes in your symptoms. Take these actions to help with your pain: Managing pain and stiffness  If directed, apply ice to the painful area. Your health care provider may recommend applying ice during the first 24-48 hours after a flare-up begins. To do this: ? Put ice in a plastic bag. ? Place a towel between your skin and the bag. ? Leave the ice on for 20 minutes, 2-3 times per day.  If directed, apply heat to the affected area as often as told by your health care provider. Use the heat source that your health care provider recommends, such as a moist heat pack or a heating pad. ? Place a towel between your skin and the heat source. ? Leave the heat on for 20-30 minutes. ? Remove the heat if your skin turns bright red. This is especially important if you are unable to feel pain, heat, or cold. You may have a greater risk of getting burned.  Try soaking in a warm tub.      Activity  Avoid bending and other activities that make the problem worse.  Maintain a proper position when standing or sitting: ? When standing, keep your upper back and neck straight, with your shoulders pulled back. Avoid slouching. ? When sitting, keep your back straight and relax your shoulders. Do not round your shoulders or pull them backward.  Do not sit or stand in one place for long periods of time.  Take brief periods of rest  throughout the day. This will reduce your pain. Resting in a lying or standing position is usually better than sitting to rest.  When you are resting for longer periods, mix in some mild activity or stretching between periods of rest. This will help to prevent stiffness and pain.  Get regular exercise. Ask your health care provider what activities are safe for you.  Do not lift anything that is heavier than 10 lb (4.5 kg), or the limit that you are told, until your health care provider says that it is safe. Always use proper lifting technique, which includes: ? Bending your knees. ? Keeping the load close to your body. ? Avoiding twisting.  Sleep on a firm mattress in a comfortable position. Try lying on your side with your knees slightly bent. If you lie on your back, put a pillow under your knees.   Medicines  Treatment may include medicines for pain and inflammation taken by mouth or applied to the skin, prescription pain medicine, or muscle relaxants. Take over-the-counter and prescription medicines only as told by your health care provider.  Ask your health care provider if the medicine prescribed to you: ? Requires you to avoid driving or using machinery. ? Can cause constipation. You may need to take these actions to prevent or treat constipation:  Drink enough fluid to keep your urine pale yellow.  Take over-the-counter or prescription medicines.  Eat foods that are high in fiber, such as   beans, whole grains, and fresh fruits and vegetables.  Limit foods that are high in fat and processed sugars, such as fried or sweet foods. General instructions  Do not use any products that contain nicotine or tobacco, such as cigarettes, e-cigarettes, and chewing tobacco. If you need help quitting, ask your health care provider.  Keep all follow-up visits as told by your health care provider. This is important. Contact a health care provider if:  You have pain that is not relieved with  rest or medicine.  Your pain gets worse, or you have new pain.  You have a high fever.  You have rapid weight loss.  You have trouble doing your normal activities. Get help right away if:  You have weakness or numbness in one or both of your legs or feet.  You have trouble controlling your bladder or your bowels.  You have severe back pain and have any of the following: ? Nausea or vomiting. ? Pain in your abdomen. ? Shortness of breath or you faint. Summary  Chronic back pain is back pain that lasts longer than 3 months.  When a flare-up begins, apply ice to the painful area for the first 24-48 hours.  Apply a moist heat pad or use a heating pad on the painful area as directed by your health care provider.  When you are resting for longer periods, mix in some mild activity or stretching between periods of rest. This will help to prevent stiffness and pain. This information is not intended to replace advice given to you by your health care provider. Make sure you discuss any questions you have with your health care provider. Document Revised: 02/06/2019 Document Reviewed: 02/06/2019 Elsevier Patient Education  2021 Elsevier Inc.  

## 2020-02-12 NOTE — Addendum Note (Signed)
Addended by: Ronette Deter on: 02/12/2020 04:02 PM   Modules accepted: Orders

## 2020-02-12 NOTE — Progress Notes (Signed)
Needs refill on BP medication.

## 2020-02-13 LAB — BASIC METABOLIC PANEL
BUN/Creatinine Ratio: 14 (ref 9–20)
BUN: 12 mg/dL (ref 6–24)
CO2: 21 mmol/L (ref 20–29)
Calcium: 9.8 mg/dL (ref 8.7–10.2)
Chloride: 102 mmol/L (ref 96–106)
Creatinine, Ser: 0.84 mg/dL (ref 0.76–1.27)
GFR calc Af Amer: 116 mL/min/{1.73_m2} (ref >59)
GFR calc non Af Amer: 100 mL/min/{1.73_m2} (ref >59)
Glucose: 85 mg/dL (ref 65–99)
Potassium: 4 mmol/L (ref 3.5–5.2)
Sodium: 139 mmol/L (ref 134–144)

## 2020-02-26 ENCOUNTER — Ambulatory Visit: Payer: Self-pay | Admitting: Pharmacist

## 2020-02-27 ENCOUNTER — Other Ambulatory Visit: Payer: Self-pay

## 2020-02-27 ENCOUNTER — Encounter: Payer: Self-pay | Admitting: Pharmacist

## 2020-02-27 ENCOUNTER — Ambulatory Visit: Payer: 59 | Attending: Family Medicine | Admitting: Pharmacist

## 2020-02-27 DIAGNOSIS — I110 Hypertensive heart disease with heart failure: Secondary | ICD-10-CM

## 2020-02-27 DIAGNOSIS — I5042 Chronic combined systolic (congestive) and diastolic (congestive) heart failure: Secondary | ICD-10-CM

## 2020-02-27 MED ORDER — LISINOPRIL 10 MG PO TABS: 10.0000 mg | ORAL_TABLET | Freq: Every day | ORAL | 3 refills | Status: DC

## 2020-02-27 MED ORDER — NITROGLYCERIN 0.4 MG SL SUBL: 0.4000 mg | SUBLINGUAL_TABLET | SUBLINGUAL | 3 refills | Status: DC | PRN

## 2020-02-27 NOTE — Progress Notes (Signed)
   S:    PCP: Dr. Alvis Lemmings   Patient arrives in good spirits. Presents to the clinic for hypertension evaluation, counseling, and management. Pt was seen and referred by Dr. Alvis Lemmings on 02/12/2020. He reported issues with taking lisinopril d/t misinformation. After consulting with Dr. Alvis Lemmings he agreed to restart lisinopril.   He has a PMH of NSTEMI, LV thrombus, CAD, HTN. Today, he denies chest pain, dyspnea, or blurred vision. Does endorse the occasional "slight" HA. He expresses desire to do everything possible to control BP.   Medication adherence reported.  Current BP Medications include: carvedilol 12.5 mg BID, lisinopril 5 mg daily  Antihypertensives tried in the past include: NKDA  Dietary habits include: compliant with salt restriction; trying to implement more of a DASH-based diet.  Exercise habits include: limited d/t chronic back pain Family / Social history:  - FHx: pertinent negatives include MI, HTN, stroke - Tobacco: current every day smoker. Smokes 1-2 cigarettes/day; doing okay on bupropion  - Alcohol: admits to 1-2 beers several days out of the week  O:  Vitals:   02/27/20 1121  BP: (!) 175/104  Pulse: (!) 50    Home BP readings: does not check at home; does not have a cough  Last 3 Office BP readings: BP Readings from Last 3 Encounters:  02/27/20 (!) 175/104  02/12/20 (!) 180/108  10/30/19 (!) 146/92   BMET    Component Value Date/Time   NA 139 02/12/2020 1545   K 4.0 02/12/2020 1545   CL 102 02/12/2020 1545   CO2 21 02/12/2020 1545   GLUCOSE 85 02/12/2020 1545   GLUCOSE 101 (H) 12/07/2015 1053   BUN 12 02/12/2020 1545   CREATININE 0.84 02/12/2020 1545   CREATININE 0.91 12/07/2015 1053   CALCIUM 9.8 02/12/2020 1545   GFRNONAA 100 02/12/2020 1545   GFRAA 116 02/12/2020 1545   Renal function: CrCl cannot be calculated (Unknown ideal weight.).   Clinical ASCVD: Yes  The ASCVD Risk score Denman George DC Jr., et al., 2013) failed to calculate for the following  reasons:   The patient has a prior MI or stroke diagnosis  A/P: Hypertension longstanding currently uncontroleld on current medications. BP Goal = < 130/80 mmHg. Medication adherence reported.  -Increased dose of lisinopril to 10 mg daily as instructed by PCP.  -Counseled on lifestyle modifications for blood pressure control including reduced dietary sodium, increased exercise, adequate sleep.  Results reviewed and written information provided.   Total time in face-to-face counseling 30 minutes.   F/U Clinic Visit in 1 month.    Patient seen with:  Adam Phenix, PharmD Candidate [employer]  Butch Penny, PharmD, [place], CPP Clinical Pharmacist [facility] [phone]

## 2020-03-19 ENCOUNTER — Telehealth: Payer: Self-pay | Admitting: Interventional Cardiology

## 2020-03-19 DIAGNOSIS — I255 Ischemic cardiomyopathy: Secondary | ICD-10-CM

## 2020-03-19 MED ORDER — CLOPIDOGREL BISULFATE 75 MG PO TABS: 75.0000 mg | ORAL_TABLET | Freq: Every day | ORAL | 2 refills | Status: DC

## 2020-03-19 NOTE — Telephone Encounter (Signed)
*  STAT* If patient is at the pharmacy, call can be transferred to refill team.   1. Which medications need to be refilled? (please list name of each medication and dose if known)  clopidogrel (PLAVIX) 75 MG tablet   2. Which pharmacy/location (including street and city if local pharmacy) is medication to be sent to? [id number] - [place], [place] - [address] AT [address]   3. Do they need a 30 day or 90 day supply? 90 day   Patient is out of medication 

## 2020-03-19 NOTE — Telephone Encounter (Signed)
Pt's medication was sent to pt's pharmacy as requested. Confirmation received.  °

## 2020-03-26 ENCOUNTER — Ambulatory Visit: Payer: Self-pay | Admitting: Pharmacist

## 2020-04-08 ENCOUNTER — Ambulatory Visit: Payer: Self-pay | Admitting: Family Medicine

## 2020-04-19 ENCOUNTER — Other Ambulatory Visit: Payer: Self-pay | Admitting: Family Medicine

## 2020-04-19 NOTE — Telephone Encounter (Signed)
Requested Prescriptions  Pending Prescriptions Disp Refills  . buPROPion (WELLBUTRIN XL) 150 MG 24 hr tablet [Pharmacy Med Name: BUPROPION XL 150MG  TABLETS (24 H)] 90 tablet 0    Sig: TAKE 1 TABLET BY MOUTH EVERY DAY     Psychiatry: Antidepressants - bupropion Failed - 04/19/2020  8:08 AM      Failed - Last BP in normal range    BP Readings from Last 1 Encounters:  02/27/20 (!) 175/104         Passed - Valid encounter within last 6 months    Recent Outpatient Visits          1 month ago Hypertensive heart disease with chronic combined systolic and diastolic congestive heart failure [facility])   [facility] [place], KOOMBERKINE, RPH-CPP   2 months ago Need for hepatitis C screening test   [facility], MD   6 months ago Cardiomyopathy, ischemic   [facility] [place], [place], MD   12 months ago Chronic right-sided low back pain with right-sided sciatica   [facility] [place], MD   1 year ago Other fatigue   [place] [facility] Hoy Register, MD      Future Appointments            In 3 weeks Hoy Register, MD [facility]

## 2020-05-11 ENCOUNTER — Ambulatory Visit: Payer: Self-pay | Admitting: Family Medicine

## 2020-05-21 ENCOUNTER — Other Ambulatory Visit: Payer: Self-pay | Admitting: Family Medicine

## 2020-05-21 DIAGNOSIS — I110 Hypertensive heart disease with heart failure: Secondary | ICD-10-CM

## 2020-08-03 ENCOUNTER — Telehealth: Payer: Self-pay | Admitting: *Deleted

## 2020-08-03 ENCOUNTER — Other Ambulatory Visit: Payer: Self-pay | Admitting: *Deleted

## 2020-08-03 MED ORDER — SILDENAFIL CITRATE 20 MG PO TABS
ORAL_TABLET | ORAL | 1 refills | Status: DC
Start: 2020-08-03 — End: 2020-10-05

## 2020-08-03 NOTE — Telephone Encounter (Signed)
Pt calling in today to get refill on sildenafil gave pt # 30 with 1 refill and f/u appointment.

## 2020-08-10 ENCOUNTER — Other Ambulatory Visit: Payer: Self-pay | Admitting: Family Medicine

## 2020-08-10 DIAGNOSIS — F4323 Adjustment disorder with mixed anxiety and depressed mood: Secondary | ICD-10-CM

## 2020-08-10 NOTE — Telephone Encounter (Signed)
Requested medications are due for refill today yes  Requested medications are on the active medication list yes  Last refill 4/28  Last visit last visit that addressed adjustment disorder was 10/2019, seen 2/2 for other dx  Future visit scheduled no  Notes to clinic failed protocol of visit that addresses dx/med within 6 months, no upcoming visit scheduled.

## 2020-08-15 ENCOUNTER — Other Ambulatory Visit: Payer: Self-pay | Admitting: Interventional Cardiology

## 2020-08-15 DIAGNOSIS — I255 Ischemic cardiomyopathy: Secondary | ICD-10-CM

## 2020-08-16 ENCOUNTER — Other Ambulatory Visit: Payer: Self-pay | Admitting: Family Medicine

## 2020-08-16 NOTE — Telephone Encounter (Signed)
Requested Prescriptions  Pending Prescriptions Disp Refills  . buPROPion (WELLBUTRIN XL) 150 MG 24 hr tablet [Pharmacy Med Name: BUPROPION XL 150MG  TABLETS (24 H)] 90 tablet 0    Sig: TAKE 1 TABLET BY MOUTH EVERY DAY     Psychiatry: Antidepressants - bupropion Failed - 08/16/2020  9:41 AM      Failed - Last BP in normal range    BP Readings from Last 1 Encounters:  02/27/20 (!) 175/104         Passed - Valid encounter within last 6 months    Recent Outpatient Visits          5 months ago Hypertensive heart disease with chronic combined systolic and diastolic congestive heart failure [facility])   [facility] [place], KOOMBERKINE, RPH-CPP   6 months ago Need for hepatitis C screening test   [facility], MD   10 months ago Cardiomyopathy, ischemic   [facility] [place], [place], MD   1 year ago Chronic right-sided low back pain with right-sided sciatica   [place] [facility] [place] Horns, MD   1 year ago Other fatigue    [facility] Hoy Register, MD      Future Appointments            In 2 weeks [place], [place], MD [facility] [address] Office, [facility]

## 2020-08-22 ENCOUNTER — Other Ambulatory Visit: Payer: Self-pay | Admitting: Family Medicine

## 2020-08-22 DIAGNOSIS — G8929 Other chronic pain: Secondary | ICD-10-CM

## 2020-08-22 NOTE — Telephone Encounter (Signed)
Requested Prescriptions  Pending Prescriptions Disp Refills  . gabapentin (NEURONTIN) 300 MG capsule [Pharmacy Med Name: GABAPENTIN 300MG  CAPSULES] 180 capsule 0    Sig: TAKE 1 CAPSULE BY MOUTH TWICE DAILY     Neurology: Anticonvulsants - gabapentin Passed - 08/22/2020  3:17 PM      Passed - Valid encounter within last 12 months    Recent Outpatient Visits          5 months ago Hypertensive heart disease with chronic combined systolic and diastolic congestive heart failure [facility])   [facility] Wellness [place] Guerra, KOOMBERKINE, RPH-CPP   6 months ago Need for hepatitis C screening test   [facility], MD   10 months ago Cardiomyopathy, ischemic   [facility] [place], [place], MD   1 year ago Chronic right-sided low back pain with right-sided sciatica   [facility] Odette Horns, MD   1 year ago Other fatigue   [place] [facility] Hoy Register, MD      Future Appointments            In 1 week [place], [place], MD [facility], [facility]

## 2020-09-03 ENCOUNTER — Ambulatory Visit: Payer: 59 | Admitting: Interventional Cardiology

## 2020-09-03 NOTE — Progress Notes (Deleted)
Cardiology Office Note   Date:  09/03/2020   ID:  Thomas Woods, DOB 15-Sep-1965, MRN [id number]  PCP:  Hoy Register, MD    No chief complaint on file.  CAD/Old MI  Wt Readings from Last 3 Encounters:  02/12/20 217 lb 6.4 oz (98.6 kg)  10/30/19 210 lb (95.3 kg)  03/20/19 210 lb (95.3 kg)       History of Present Illness: Thomas Woods is a 55 y.o. male   Who had an anterior MI in 3/16. Angina was a burning sensation. He had 2 long overlapping drug-eluting stents to the LAD. His EF was low at 30%.  He had an apical thrombus,  and was treated with warfarin for a few months.  EF came back to low normal in 6/16.   He has tried patches and pills to stop smoking.  He has been unsuccessful.   In late 2019, he had two eye surgeries for glaucoma in [place].  He was unable to exercise.  No lifting was allowed.     He has some DOE. It occurs occasionally.  He has some joint pains as well limiting his activity.     In the past, he has had some bloody sputum/spit.  He had been forgetting his evening dose of Coreg.   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   On Coumadin   NSTEMI (non-ST elevated myocardial infarction) (HCC) 03/26/2014   Patient pain increased and he developed ECG changes, taken to the cath lab as a STEMI, PCI to the LAD    Past Surgical History:  Procedure Laterality Date   HERNIA REPAIR     LEFT HEART CATHETERIZATION WITH CORONARY ANGIOGRAM N/A 03/27/2014   Procedure: LEFT HEART CATHETERIZATION WITH CORONARY ANGIOGRAM;  Surgeon: Corky Crafts, MD; L main okay, LAD 100%, CFX okay, RI moderate disease, OM1 mild disease, RCA moderate disease, EF 30%   PERCUTANEOUS CORONARY STENT  INTERVENTION (PCI-S)  03/27/2014   3.0 x 32 Synergy overlapped with 2.5 x 38 Synergy to the mLAD     Current Outpatient Medications  Medication Sig Dispense Refill   buPROPion (WELLBUTRIN XL) 150 MG 24 hr tablet TAKE 1 TABLET BY MOUTH EVERY DAY 90 tablet 0   busPIRone (BUSPAR) 10 MG tablet Take 1 tablet (10 mg total) by mouth 2 (two) times daily. 180 tablet 1   carvedilol (COREG) 12.5 MG tablet TAKE 1 TABLET (12.5 MG TOTAL) BY MOUTH 2 (TWO) TIMES DAILY WITH A MEAL. 180 tablet 2   clopidogrel (PLAVIX) 75 MG tablet Take 1 tablet (75 mg total) by mouth daily. 90 tablet 2   gabapentin (NEURONTIN) 300 MG capsule TAKE 1 CAPSULE BY MOUTH TWICE DAILY 180 capsule 0   lisinopril (ZESTRIL) 10 MG tablet Take 1 tablet (10 mg total) by mouth daily. 90 tablet 3   methocarbamol (ROBAXIN) 500 MG tablet TAKE 1 TABLET (500 MG TOTAL) BY MOUTH EVERY 8 (EIGHT) HOURS AS NEEDED FOR MUSCLE SPASMS. 90 tablet 1   nitroGLYCERIN (NITROSTAT) 0.4 MG SL tablet Place 1 tablet (0.4 mg total) under the tongue every 5 (five) minutes as needed for chest pain (x 3 doses). 25 tablet 3  prednisoLONE acetate (PRED FORTE) 1 % ophthalmic suspension Apply 1 drop to eye daily.      rosuvastatin (CRESTOR) 20 MG tablet Take 1 tablet (20 mg total) by mouth daily. 90 tablet 3   sildenafil (REVATIO) 20 MG tablet TAKE 3 TO 5 TABLETS BY MOUTH ONCE DAILY AS NEEDED 1  HOUR  PRIOR  TO  SEXUAL  ACTIVITY 30 tablet 1   sildenafil (VIAGRA) 50 MG tablet TAKE 1 TABLET BY MOUTH DAILY AS NEEDED FOR ERECTILE DYSFUNCTION (Patient not taking: Reported on 02/12/2020) 30 tablet 3   traMADol (ULTRAM) 50 MG tablet Take 1 tablet (50 mg total) by mouth every 12 (twelve) hours as needed. 40 tablet 1   No current facility-administered medications for this visit.    Allergies:   Patient has no known allergies.    Social History:  The patient  reports that he has been smoking cigarettes. He has been smoking an average of .2 packs per day. He has never used  smokeless tobacco. He reports current alcohol use. He reports that he does not use drugs.   Family History:  The patient's ***family history includes Healthy in his brother, mother, and sister.    ROS:  Please see the history of present illness.   Otherwise, review of systems are positive for ***.   All other systems are reviewed and negative.    PHYSICAL EXAM: VS:  There were no vitals taken for this visit. , BMI There is no height or weight on file to calculate BMI. GEN: Well nourished, well developed, in no acute distress HEENT: normal Neck: no JVD, carotid bruits, or masses Cardiac: ***RRR; no murmurs, rubs, or gallops,no edema  Respiratory:  clear to auscultation bilaterally, normal work of breathing GI: soft, nontender, nondistended, + BS MS: no deformity or atrophy Skin: warm and dry, no rash Neuro:  Strength and sensation are intact Psych: euthymic mood, full affect   EKG:   The ekg ordered today demonstrates ***   Recent Labs: 02/12/2020: BUN 12; Creatinine, Ser 0.84; Potassium 4.0; Sodium 139   Lipid Panel    Component Value Date/Time   CHOL 164 06/04/2019 1026   TRIG 58 06/04/2019 1026   HDL 75 06/04/2019 1026   CHOLHDL 2.2 06/04/2019 1026   CHOLHDL 2.1 12/07/2015 1053   VLDL 8 12/07/2015 1053   LDLCALC 77 06/04/2019 1026     Other studies Reviewed: Additional studies/ records that were reviewed today with results demonstrating: ***.   ASSESSMENT AND PLAN:  CAD/Old MI:  Hyperlipidemia: HTN: Tobacco abuse:    Current medicines are reviewed at length with the patient today.  The patient concerns regarding his medicines were addressed.  The following changes have been made:  No change***  Labs/ tests ordered today include: *** No orders of the defined types were placed in this encounter.   Recommend 150 minutes/week of aerobic exercise Low fat, low carb, high fiber diet recommended  Disposition:   FU in ***   Signed, Lance Muss, MD   09/03/2020 12:47 PM    [facility] [address] [place], [place], [place]  [address] Phone: [phone]; Fax: [phone]

## 2020-09-04 ENCOUNTER — Telehealth: Payer: Self-pay | Admitting: Interventional Cardiology

## 2020-09-04 ENCOUNTER — Telehealth: Payer: Self-pay

## 2020-09-04 MED ORDER — SILDENAFIL CITRATE 50 MG PO TABS
ORAL_TABLET | ORAL | 0 refills | Status: DC
Start: 2020-09-04 — End: 2021-01-22

## 2020-09-04 NOTE — Telephone Encounter (Signed)
Refill sent to pharmacy.   

## 2020-09-04 NOTE — Telephone Encounter (Signed)
**Note De-Identified Sarinah Doetsch Obfuscation** I started a Sildenafil PA through covermymeds. Key: [id number]

## 2020-09-04 NOTE — Telephone Encounter (Signed)
*  STAT* If patient is at the pharmacy, call can be transferred to refill team.   1. Which medications need to be refilled? (please list name of each medication and dose if known) sildenafil (REVATIO) 20 MG tablet  2. Which pharmacy/location (including street and city if local pharmacy) is medication to be sent to? [facility] [phone] - [place] - [address]  3. Do they need a 30 day or 90 day supply? 90 day   Patient has an appointment 01/22/2021

## 2020-09-07 NOTE — Telephone Encounter (Signed)
**Note De-Identified Thomas Woods Obfuscation** PA Letter/form received Thomas Woods fax from [facility] Care[facility] stating that questions on the form needs to be answered to complete this Sildenafil PA.  The letter stated that we could answer the questions on the form and fax form back to them or to call them to answer the questions so I called [employer] and answered the questions with Claudia. Per Debarah Crape we will receive a determination with in 24 to 72 hrs.

## 2020-09-16 ENCOUNTER — Other Ambulatory Visit: Payer: Self-pay | Admitting: Family Medicine

## 2020-09-17 NOTE — Telephone Encounter (Signed)
Received fax from [facility] stating that they have denied coverage for Sildenafil 50mg  tablet.

## 2020-09-17 NOTE — Telephone Encounter (Signed)
Last refill 08/16/20 requesting too soon

## 2020-09-22 ENCOUNTER — Other Ambulatory Visit: Payer: Self-pay | Admitting: Interventional Cardiology

## 2020-09-22 ENCOUNTER — Other Ambulatory Visit: Payer: Self-pay | Admitting: Family Medicine

## 2020-09-22 DIAGNOSIS — F4323 Adjustment disorder with mixed anxiety and depressed mood: Secondary | ICD-10-CM

## 2020-09-22 NOTE — Telephone Encounter (Signed)
Call to patient- appointment scheduled for 10/19/20- patient states he is out of his medication- 30 day courtesy RF given

## 2020-10-05 ENCOUNTER — Other Ambulatory Visit: Payer: Self-pay

## 2020-10-05 MED ORDER — SILDENAFIL CITRATE 20 MG PO TABS
ORAL_TABLET | ORAL | 7 refills | Status: DC
Start: 2020-10-05 — End: 2021-05-11

## 2020-10-19 ENCOUNTER — Other Ambulatory Visit: Payer: Self-pay

## 2020-10-19 ENCOUNTER — Encounter: Payer: Self-pay | Admitting: Family Medicine

## 2020-10-19 ENCOUNTER — Ambulatory Visit: Payer: 59 | Attending: Family Medicine | Admitting: Family Medicine

## 2020-10-19 VITALS — BP 159/92 | HR 79 | Ht 72.0 in | Wt 213.0 lb

## 2020-10-19 DIAGNOSIS — F4323 Adjustment disorder with mixed anxiety and depressed mood: Secondary | ICD-10-CM | POA: Diagnosis not present

## 2020-10-19 DIAGNOSIS — Z1211 Encounter for screening for malignant neoplasm of colon: Secondary | ICD-10-CM

## 2020-10-19 DIAGNOSIS — M5441 Lumbago with sciatica, right side: Secondary | ICD-10-CM

## 2020-10-19 DIAGNOSIS — I110 Hypertensive heart disease with heart failure: Secondary | ICD-10-CM | POA: Diagnosis not present

## 2020-10-19 DIAGNOSIS — G8929 Other chronic pain: Secondary | ICD-10-CM

## 2020-10-19 DIAGNOSIS — I5042 Chronic combined systolic (congestive) and diastolic (congestive) heart failure: Secondary | ICD-10-CM

## 2020-10-19 DIAGNOSIS — I10 Essential (primary) hypertension: Secondary | ICD-10-CM

## 2020-10-19 DIAGNOSIS — Z1159 Encounter for screening for other viral diseases: Secondary | ICD-10-CM

## 2020-10-19 MED ORDER — LISINOPRIL 20 MG PO TABS: 20.0000 mg | ORAL_TABLET | Freq: Every day | ORAL | 1 refills | Status: DC

## 2020-10-19 MED ORDER — BUPROPION HCL ER (XL) 150 MG PO TB24
150.0000 mg | ORAL_TABLET | Freq: Every day | ORAL | 1 refills | Status: DC
Start: 2020-10-19 — End: 2021-05-11

## 2020-10-19 MED ORDER — BUSPIRONE HCL 10 MG PO TABS: 10.0000 mg | ORAL_TABLET | Freq: Two times a day (BID) | ORAL | 1 refills | Status: DC

## 2020-10-19 MED ORDER — TRAMADOL HCL 50 MG PO TABS: 50.0000 mg | ORAL_TABLET | Freq: Two times a day (BID) | ORAL | 1 refills | Status: DC | PRN

## 2020-10-19 NOTE — Patient Instructions (Signed)

## 2020-10-19 NOTE — Progress Notes (Signed)
Pain in lower back and left hip.

## 2020-10-19 NOTE — Progress Notes (Signed)
Subjective:  Patient ID: Thomas Woods, male    DOB: 1965-08-02  Age: 55 y.o. MRN: [id number]  CC: Hypertension   HPI Thomas Woods is a 55 y.o. year old male with a history of hypertension, hyperlipidemia, ischemic cardiomyopathy (EF 50-55% in 2016), s/p overlapping LAD stents, and anxiety who presents today for a follow up visit.     Interval History: He had a [facility] with [facility] in [facility]. His back and L hip have been hurting. Sometimes it feels like his LLE will give out. The epidural spinal injection he received worked for a month and then pain returned.  Would like a refill of tramadol which he uses as needed.  He is requesting a note to be out of work today.  His BP is elevated and he took his antihypertensive today.  Denies presence of chest pain, pedal edema, weight gain. 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 ([place]) 03/27/2014   On Coumadin   NSTEMI (non-ST elevated myocardial infarction) ([place]) 03/26/2014   Patient pain increased and he developed ECG changes, taken to the cath lab as a STEMI, PCI to the LAD    Past Surgical History:  Procedure Laterality Date   HERNIA REPAIR     LEFT HEART CATHETERIZATION WITH CORONARY ANGIOGRAM N/A 03/27/2014   Procedure: LEFT HEART CATHETERIZATION WITH CORONARY ANGIOGRAM;  Surgeon: Jettie Booze, MD; L main okay, LAD 100%, CFX okay, RI moderate disease, OM1 mild disease, RCA moderate disease, EF 30%   PERCUTANEOUS CORONARY STENT INTERVENTION (PCI-S)  03/27/2014   3.0 x 32 Synergy overlapped with 2.5 x 38 Synergy to the mLAD    Family History  Problem Relation Age of Onset   Healthy Mother    Healthy Sister    Healthy Brother     Heart attack Neg Hx    Hypertension Neg Hx    Stroke Neg Hx     No Known Allergies  Outpatient Medications Prior to Visit  Medication Sig Dispense Refill   carvedilol (COREG) 12.5 MG tablet TAKE 1 TABLET (12.5 MG TOTAL) BY MOUTH 2 (TWO) TIMES DAILY WITH A MEAL. 180 tablet 2   clopidogrel (PLAVIX) 75 MG tablet Take 1 tablet (75 mg total) by mouth daily. 90 tablet 2   gabapentin (NEURONTIN) 300 MG capsule TAKE 1 CAPSULE BY MOUTH TWICE DAILY 180 capsule 0   methocarbamol (ROBAXIN) 500 MG tablet TAKE 1 TABLET (500 MG TOTAL) BY MOUTH EVERY 8 (EIGHT) HOURS AS NEEDED FOR MUSCLE SPASMS. 90 tablet 1   rosuvastatin (CRESTOR) 20 MG tablet Take 1 tablet (20 mg total) by mouth daily. 90 tablet 3   sildenafil (REVATIO) 20 MG tablet TAKE 3 TO 5 TABLETS BY MOUTH ONCE DAILY AS NEEDED 1  HOUR  PRIOR  TO  SEXUAL  ACTIVITY 30 tablet 7   sildenafil (VIAGRA) 50 MG tablet TAKE 1 TABLET BY MOUTH DAILY AS NEEDED FOR ERECTILE DYSFUNCTION 90 tablet 0   buPROPion (WELLBUTRIN XL) 150 MG 24 hr tablet TAKE 1 TABLET BY MOUTH EVERY DAY 90 tablet 0   busPIRone (BUSPAR) 10 MG tablet TAKE 1 TABLET BY MOUTH TWICE DAILY 60 tablet 0   lisinopril (ZESTRIL)  10 MG tablet Take 1 tablet (10 mg total) by mouth daily. 90 tablet 3   traMADol (ULTRAM) 50 MG tablet Take 1 tablet (50 mg total) by mouth every 12 (twelve) hours as needed. 40 tablet 1   nitroGLYCERIN (NITROSTAT) 0.4 MG SL tablet Place 1 tablet (0.4 mg total) under the tongue every 5 (five) minutes as needed for chest pain (x 3 doses). 25 tablet 3   prednisoLONE acetate (PRED FORTE) 1 % ophthalmic suspension Apply 1 drop to eye daily.  (Patient not taking: Reported on 10/19/2020)     No facility-administered medications prior to visit.     ROS Review of Systems  Constitutional:  Negative for activity change and appetite change.  HENT:  Negative for sinus pressure and sore throat.   Eyes:  Negative for visual disturbance.  Respiratory:  Negative for cough, chest tightness  and shortness of breath.   Cardiovascular:  Negative for chest pain and leg swelling.  Gastrointestinal:  Negative for abdominal distention, abdominal pain, constipation and diarrhea.  Endocrine: Negative.   Genitourinary:  Negative for dysuria.  Musculoskeletal:  Positive for back pain. Negative for joint swelling and myalgias.  Skin:  Negative for rash.  Allergic/Immunologic: Negative.   Neurological:  Negative for weakness, light-headedness and numbness.  Psychiatric/Behavioral:  Negative for dysphoric mood and suicidal ideas.    Objective:  BP (!) 159/92   Pulse 79   Ht 6' (1.829 m)   Wt 213 lb (96.6 kg)   SpO2 98%   BMI 28.89 kg/m   BP/Weight 10/19/2020 2/95/6213 0/08/6576  Systolic BP 469 629 528  Diastolic BP 92 413 244  Wt. (Lbs) 213 - 217.4  BMI 28.89 - 29.48      Physical Exam Constitutional:      Appearance: He is well-developed.  Cardiovascular:     Rate and Rhythm: Normal rate.     Heart sounds: Normal heart sounds. No murmur heard. Pulmonary:     Effort: Pulmonary effort is normal.     Breath sounds: Normal breath sounds. No wheezing or rales.  Chest:     Chest wall: No tenderness.  Abdominal:     General: Bowel sounds are normal. There is no distension.     Palpations: Abdomen is soft. There is no mass.     Tenderness: There is no abdominal tenderness.  Musculoskeletal:        General: Normal range of motion.     Right lower leg: No edema.     Left lower leg: No edema.     Comments: Positive straight leg raise on the left; negative on the right  Neurological:     Mental Status: He is alert and oriented to person, place, and time.  Psychiatric:        Mood and Affect: Mood normal.    CMP Latest Ref Rng & Units 02/12/2020 06/04/2019 03/20/2019  Glucose 65 - 99 mg/dL 85 80 81  BUN 6 - 24 mg/dL 12 13 12   Creatinine 0.76 - 1.27 mg/dL 0.84 0.87 0.85  Sodium 134 - 144 mmol/L 139 139 137  Potassium 3.5 - 5.2 mmol/L 4.0 3.7 4.6  Chloride 96 - 106 mmol/L  102 102 101  CO2 20 - 29 mmol/L 21 21 23   Calcium 8.7 - 10.2 mg/dL 9.8 9.5 9.5  Total Protein 6.0 - 8.5 g/dL - 7.5 7.4  Total Bilirubin 0.0 - 1.2 mg/dL - 0.8 0.8  Alkaline Phos 48 - 121 IU/L - 46(L) 50  AST 0 -  40 IU/L - 34 28  ALT 0 - 44 IU/L - 28 29    Lipid Panel     Component Value Date/Time   CHOL 164 06/04/2019 1026   TRIG 58 06/04/2019 1026   HDL 75 06/04/2019 1026   CHOLHDL 2.2 06/04/2019 1026   CHOLHDL 2.1 12/07/2015 1053   VLDL 8 12/07/2015 1053   LDLCALC 77 06/04/2019 1026    CBC    Component Value Date/Time   WBC 4.4 03/20/2019 1044   WBC 6.0 04/02/2014 0356   RBC 4.22 03/20/2019 1044   RBC 4.68 04/02/2014 0356   HGB 13.6 03/20/2019 1044   HCT 39.8 03/20/2019 1044   PLT 194 03/20/2019 1044   MCV 94 03/20/2019 1044   MCH 32.2 03/20/2019 1044   MCH 31.2 04/02/2014 0356   MCHC 34.2 03/20/2019 1044   MCHC 34.0 04/02/2014 0356   RDW 13.2 03/20/2019 1044   LYMPHSABS 2.3 03/20/2019 1044   EOSABS 0.2 03/20/2019 1044   BASOSABS 0.0 03/20/2019 1044    No results found for: HGBA1C  Assessment & Plan:  1. Adjustment disorder with mixed anxiety and depressed mood Stable - busPIRone (BUSPAR) 10 MG tablet; Take 1 tablet (10 mg total) by mouth 2 (two) times daily.  Dispense: 180 tablet; Refill: 1  2. Chronic right-sided low back pain with right-sided sciatica Uncontrolled Status post epidural spinal injections He will be reaching out to his neurosurgeon for an appointment to follow-up on this - traMADol (ULTRAM) 50 MG tablet; Take 1 tablet (50 mg total) by mouth every 12 (twelve) hours as needed.  Dispense: 40 tablet; Refill: 1  3. Hypertensive heart disease with chronic combined systolic and diastolic congestive heart failure ([place]) Euvolemic with EF of 50 to 55% from echo 2016 Continue current medications - Lipid panel; Future - CMP14+EGFR; Future - lisinopril (ZESTRIL) 20 MG tablet; Take 1 tablet (20 mg total) by mouth daily.  Dispense: 90 tablet; Refill:  1  4. Need for hepatitis C screening test - HCV RNA quant rflx ultra or genotyp([facility])  5. Screening for colon cancer - Ambulatory referral to Gastroenterology  6.  Essential hypertension Uncontrolled Lisinopril dose increased from 10 mg to 20 mg We we will order basic metabolic panel to assess potassium and renal function in 2 weeks Counseled on blood pressure goal of less than 130/80, low-sodium, DASH diet, medication compliance, 150 minutes of moderate intensity exercise per week. Discussed medication compliance, adverse effects.  Meds ordered this encounter  Medications   buPROPion (WELLBUTRIN XL) 150 MG 24 hr tablet    Sig: Take 1 tablet (150 mg total) by mouth daily.    Dispense:  90 tablet    Refill:  1   busPIRone (BUSPAR) 10 MG tablet    Sig: Take 1 tablet (10 mg total) by mouth 2 (two) times daily.    Dispense:  180 tablet    Refill:  1   lisinopril (ZESTRIL) 20 MG tablet    Sig: Take 1 tablet (20 mg total) by mouth daily.    Dispense:  90 tablet    Refill:  1    Dose increase   traMADol (ULTRAM) 50 MG tablet    Sig: Take 1 tablet (50 mg total) by mouth every 12 (twelve) hours as needed.    Dispense:  40 tablet    Refill:  1           Charlott Rakes, MD, FAAFP. [facility] [place], [place]  10/19/2020, 6:06 PM

## 2020-11-02 ENCOUNTER — Other Ambulatory Visit: Payer: 59

## 2020-11-24 ENCOUNTER — Ambulatory Visit: Payer: 59

## 2020-11-24 ENCOUNTER — Other Ambulatory Visit: Payer: Self-pay

## 2020-11-25 ENCOUNTER — Other Ambulatory Visit: Payer: 59

## 2020-11-30 ENCOUNTER — Other Ambulatory Visit: Payer: Self-pay | Admitting: Interventional Cardiology

## 2020-11-30 DIAGNOSIS — I255 Ischemic cardiomyopathy: Secondary | ICD-10-CM

## 2020-12-13 ENCOUNTER — Other Ambulatory Visit: Payer: Self-pay | Admitting: Interventional Cardiology

## 2020-12-13 ENCOUNTER — Other Ambulatory Visit: Payer: Self-pay | Admitting: Family Medicine

## 2020-12-13 DIAGNOSIS — I255 Ischemic cardiomyopathy: Secondary | ICD-10-CM

## 2020-12-13 DIAGNOSIS — G8929 Other chronic pain: Secondary | ICD-10-CM

## 2020-12-13 NOTE — Telephone Encounter (Signed)
Requested medication (s) are due for refill today: yes  Requested medication (s) are on the active medication list: yes  Last refill:  08/22/20  Future visit scheduled: yes  Notes to clinic:  last OV that discusses gabapentin 10/21/19   Requested Prescriptions  Pending Prescriptions Disp Refills   gabapentin (NEURONTIN) 300 MG capsule [Pharmacy Med Name: GABAPENTIN 300MG  CAPSULES] 180 capsule 0    Sig: TAKE 1 CAPSULE BY MOUTH TWICE DAILY     Neurology: Anticonvulsants - gabapentin Passed - 12/13/2020 10:15 AM      Passed - Valid encounter within last 12 months    Recent Outpatient Visits           1 month ago Hypertensive heart disease with chronic combined systolic and diastolic congestive heart failure (HCC)   [place] [facility] [place], [place], MD   9 months ago Hypertensive heart disease with chronic combined systolic and diastolic congestive heart failure [facility])   [facility] [place], KOOMBERKINE, RPH-CPP   10 months ago Need for hepatitis C screening test   [facility], MD   1 year ago Cardiomyopathy, ischemic   [place] [facility] [place], [place], MD   1 year ago Chronic right-sided low back pain with right-sided sciatica   [facility], MD       Future Appointments             In 1 month Hoy Register, [place], MD [facility], [facility]   In 4 months [employer], MD [facility]

## 2020-12-18 ENCOUNTER — Other Ambulatory Visit: Payer: 59

## 2020-12-23 ENCOUNTER — Other Ambulatory Visit: Payer: 59

## 2020-12-28 ENCOUNTER — Ambulatory Visit: Payer: 59 | Admitting: Gastroenterology

## 2021-01-05 ENCOUNTER — Encounter: Payer: Self-pay | Admitting: Family Medicine

## 2021-01-22 ENCOUNTER — Ambulatory Visit (INDEPENDENT_AMBULATORY_CARE_PROVIDER_SITE_OTHER): Payer: 59 | Admitting: Interventional Cardiology

## 2021-01-22 ENCOUNTER — Encounter: Payer: Self-pay | Admitting: Interventional Cardiology

## 2021-01-22 ENCOUNTER — Other Ambulatory Visit: Payer: Self-pay

## 2021-01-22 VITALS — BP 132/82 | HR 72 | Ht 72.0 in | Wt 215.0 lb

## 2021-01-22 DIAGNOSIS — I25119 Atherosclerotic heart disease of native coronary artery with unspecified angina pectoris: Secondary | ICD-10-CM

## 2021-01-22 DIAGNOSIS — I255 Ischemic cardiomyopathy: Secondary | ICD-10-CM

## 2021-01-22 DIAGNOSIS — I252 Old myocardial infarction: Secondary | ICD-10-CM | POA: Diagnosis not present

## 2021-01-22 DIAGNOSIS — E782 Mixed hyperlipidemia: Secondary | ICD-10-CM | POA: Diagnosis not present

## 2021-01-22 DIAGNOSIS — I10 Essential (primary) hypertension: Secondary | ICD-10-CM

## 2021-01-22 MED ORDER — CARVEDILOL 12.5 MG PO TABS: 12.5000 mg | ORAL_TABLET | Freq: Two times a day (BID) | ORAL | 3 refills | Status: DC

## 2021-01-22 MED ORDER — CLOPIDOGREL BISULFATE 75 MG PO TABS: 75.0000 mg | ORAL_TABLET | Freq: Every day | ORAL | 3 refills | Status: DC

## 2021-01-22 MED ORDER — NITROGLYCERIN 0.4 MG SL SUBL: 0.4000 mg | SUBLINGUAL_TABLET | SUBLINGUAL | 3 refills | Status: DC | PRN

## 2021-01-22 MED ORDER — ROSUVASTATIN CALCIUM 20 MG PO TABS: 20.0000 mg | ORAL_TABLET | Freq: Every day | ORAL | 3 refills | Status: DC

## 2021-01-22 NOTE — Patient Instructions (Signed)
Medication Instructions:  Your physician recommends that you continue on your current medications as directed. Please refer to the Current Medication list given to you today.  *If you need a refill on your cardiac medications before your next appointment, please call your pharmacy*   Lab Work: Your physician recommends that you return for lab work on January 25, 2021.  The lab opens at 7:30 AM.  This will be fasting.  CMET, CBC and lipids  If you have labs (blood work) drawn today and your tests are completely normal, you will receive your results only by: [place] (if you have MyChart) OR A paper copy in the mail If you have any lab test that is abnormal or we need to change your treatment, we will call you to review the results.   Testing/Procedures: none   Follow-Up: At [facility], you and your health needs are our priority.  As part of our continuing mission to provide you with exceptional heart care, we have created designated Provider Care Teams.  These Care Teams include your primary Cardiologist (physician) and Advanced Practice Providers (APPs -  Physician Assistants and Nurse Practitioners) who all work together to provide you with the care you need, when you need it.  We recommend signing up for the patient portal called "MyChart".  Sign up information is provided on this After Visit Summary.  MyChart is used to connect with patients for Virtual Visits (Telemedicine).  Patients are able to view lab/test results, encounter notes, upcoming appointments, etc.  Non-urgent messages can be sent to your provider as well.   To learn more about what you can do with MyChart, go to [email or web address].    Your next appointment:   12 month(s)  The format for your next appointment:   In Person  Provider:   Larae Grooms, MD     Other Instructions High-Fiber Eating Plan Fiber, also called dietary fiber, is a type of carbohydrate. It is found foods such as fruits,  vegetables, whole grains, and beans. A high-fiber diet can have many health benefits. Your health care provider may recommend a high-fiber diet to help: Prevent constipation. Fiber can make your bowel movements more regular. Lower your cholesterol. Relieve the following conditions: Inflammation of veins in the anus (hemorrhoids). Inflammation of specific areas of the digestive tract (uncomplicated diverticulosis). A problem of the large intestine, also called the colon, that sometimes causes pain and diarrhea (irritable bowel syndrome, or IBS). Prevent overeating as part of a weight-loss plan. Prevent heart disease, type 2 diabetes, and certain cancers. What are tips for following this plan? Reading food labels  Check the nutrition facts label on food products for the amount of dietary fiber. Choose foods that have 5 grams of fiber or more per serving. The goals for recommended daily fiber intake include: Men (age 49 or younger): 34-38 g. Men (over age 32): 28-34 g. Women (age 55 or younger): 25-28 g. Women (over age 1): 22-25 g. Your daily fiber goal is _____________ g. Shopping Choose whole fruits and vegetables instead of processed forms, such as apple juice or applesauce. Choose a wide variety of high-fiber foods such as avocados, lentils, oats, and kidney beans. Read the nutrition facts label of the foods you choose. Be aware of foods with added fiber. These foods often have high sugar and sodium amounts per serving. Cooking Use whole-grain flour for baking and cooking. Cook with brown rice instead of white rice. Meal planning Start the day with a breakfast that is  high in fiber, such as a cereal that contains 5 g of fiber or more per serving. Eat breads and cereals that are made with whole-grain flour instead of refined flour or white flour. Eat brown rice, bulgur wheat, or millet instead of white rice. Use beans in place of meat in soups, salads, and pasta dishes. Be sure that  half of the grains you eat each day are whole grains. General information You can get the recommended daily intake of dietary fiber by: Eating a variety of fruits, vegetables, grains, nuts, and beans. Taking a fiber supplement if you are not able to take in enough fiber in your diet. It is better to get fiber through food than from a supplement. Gradually increase how much fiber you consume. If you increase your intake of dietary fiber too quickly, you may have bloating, cramping, or gas. Drink plenty of water to help you digest fiber. Choose high-fiber snacks, such as berries, raw vegetables, nuts, and popcorn. What foods should I eat? Fruits Berries. Pears. Apples. Oranges. Avocado. Prunes and raisins. Dried figs. Vegetables Sweet potatoes. Spinach. Kale. Artichokes. Cabbage. Broccoli. Cauliflower. Green peas. Carrots. Squash. Grains Whole-grain breads. Multigrain cereal. Oats and oatmeal. Brown rice. Barley. Bulgur wheat. [place]. Quinoa. Bran muffins. Popcorn. Rye wafer crackers. Meats and other proteins Navy beans, kidney beans, and pinto beans. Soybeans. Split peas. Lentils. Nuts and seeds. Dairy Fiber-fortified yogurt. Beverages Fiber-fortified soy milk. Fiber-fortified orange juice. Other foods Fiber bars. The items listed above may not be a complete list of recommended foods and beverages. Contact a dietitian for more information. What foods should I avoid? Fruits Fruit juice. Cooked, strained fruit. Vegetables Fried potatoes. Canned vegetables. Well-cooked vegetables. Grains White bread. Pasta made with refined flour. White rice. Meats and other proteins Fatty cuts of meat. Fried chicken or fried fish. Dairy Milk. Yogurt. Cream cheese. Sour cream. Fats and oils Butters. Beverages Soft drinks. Other foods Cakes and pastries. The items listed above may not be a complete list of foods and beverages to avoid. Talk with your dietitian about what choices are best for  you. Summary Fiber is a type of carbohydrate. It is found in foods such as fruits, vegetables, whole grains, and beans. A high-fiber diet has many benefits. It can help to prevent constipation, lower blood cholesterol, aid weight loss, and reduce your risk of heart disease, diabetes, and certain cancers. Increase your intake of fiber gradually. Increasing fiber too quickly may cause cramping, bloating, and gas. Drink plenty of water while you increase the amount of fiber you consume. The best sources of fiber include whole fruits and vegetables, whole grains, nuts, seeds, and beans. This information is not intended to replace advice given to you by your health care provider. Make sure you discuss any questions you have with your health care provider. Document Revised: 05/02/2019 Document Reviewed: 05/02/2019 Elsevier Patient Education  2022 [employer].
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
## 2021-01-25 ENCOUNTER — Other Ambulatory Visit: Payer: Self-pay | Admitting: Family Medicine

## 2021-01-25 ENCOUNTER — Other Ambulatory Visit: Payer: Self-pay | Admitting: Interventional Cardiology

## 2021-01-25 ENCOUNTER — Other Ambulatory Visit: Payer: 59

## 2021-01-25 DIAGNOSIS — I255 Ischemic cardiomyopathy: Secondary | ICD-10-CM

## 2021-01-25 DIAGNOSIS — F4323 Adjustment disorder with mixed anxiety and depressed mood: Secondary | ICD-10-CM

## 2021-01-25 NOTE — Telephone Encounter (Signed)
Requested Prescriptions  Pending Prescriptions Disp Refills   busPIRone (BUSPAR) 10 MG tablet [Pharmacy Med Name: BUSPIRONE 10MG  TABLETS] 180 tablet 1    Sig: TAKE 1 TABLET(10 MG) BY MOUTH TWICE DAILY     Psychiatry: Anxiolytics/Hypnotics - Non-controlled Passed - 01/25/2021  6:38 AM      Passed - Valid encounter within last 6 months    Recent Outpatient Visits          3 months ago Hypertensive heart disease with chronic combined systolic and diastolic congestive heart failure (HCC)   [facility] [place], [place], MD   11 months ago Hypertensive heart disease with chronic combined systolic and diastolic congestive heart failure [facility])   [place] [facility] [place], [place], RPH-CPP   11 months ago Need for hepatitis C screening test   [facility], MD   1 year ago Cardiomyopathy, ischemic   [place] [facility] [place], [place], MD   1 year ago Chronic right-sided low back pain with right-sided sciatica    [facility] [place], MD      Future Appointments            In 2 months Hoy Register, MD [facility] [place] [facility]

## 2021-01-26 NOTE — Addendum Note (Signed)
Addended by: Ulice Brilliant T on: 01/26/2021 04:59 PM   Modules accepted: Orders

## 2021-02-11 ENCOUNTER — Other Ambulatory Visit: Payer: Self-pay

## 2021-02-11 ENCOUNTER — Other Ambulatory Visit: Payer: 59

## 2021-02-11 DIAGNOSIS — I255 Ischemic cardiomyopathy: Secondary | ICD-10-CM

## 2021-02-11 DIAGNOSIS — I252 Old myocardial infarction: Secondary | ICD-10-CM

## 2021-02-11 DIAGNOSIS — I25119 Atherosclerotic heart disease of native coronary artery with unspecified angina pectoris: Secondary | ICD-10-CM

## 2021-02-11 DIAGNOSIS — E782 Mixed hyperlipidemia: Secondary | ICD-10-CM

## 2021-02-11 DIAGNOSIS — I10 Essential (primary) hypertension: Secondary | ICD-10-CM

## 2021-02-12 LAB — COMPREHENSIVE METABOLIC PANEL
ALT: 21 IU/L (ref 0–44)
AST: 34 IU/L (ref 0–40)
Albumin/Globulin Ratio: 1.8 (ref 1.2–2.2)
Albumin: 4.8 g/dL (ref 3.8–4.9)
Alkaline Phosphatase: 50 IU/L (ref 44–121)
BUN/Creatinine Ratio: 12 (ref 9–20)
BUN: 9 mg/dL (ref 6–24)
Bilirubin Total: 1 mg/dL (ref 0.0–1.2)
CO2: 26 mmol/L (ref 20–29)
Calcium: 9.8 mg/dL (ref 8.7–10.2)
Chloride: 104 mmol/L (ref 96–106)
Creatinine, Ser: 0.76 mg/dL (ref 0.76–1.27)
Globulin, Total: 2.6 g/dL (ref 1.5–4.5)
Glucose: 77 mg/dL (ref 70–99)
Potassium: 4.1 mmol/L (ref 3.5–5.2)
Sodium: 144 mmol/L (ref 134–144)
Total Protein: 7.4 g/dL (ref 6.0–8.5)
eGFR: 107 mL/min/{1.73_m2} (ref >59)

## 2021-02-12 LAB — CBC
Hematocrit: 43.6 % (ref 37.5–51.0)
Hemoglobin: 14.7 g/dL (ref 13.0–17.7)
MCH: 32.1 pg (ref 26.6–33.0)
MCHC: 33.7 g/dL (ref 31.5–35.7)
MCV: 95 fL (ref 79–97)
Platelets: 182 10*3/uL (ref 150–450)
RBC: 4.58 x10E6/uL (ref 4.14–5.80)
RDW: 13.1 % (ref 11.6–15.4)
WBC: 4.4 10*3/uL (ref 3.4–10.8)

## 2021-02-12 LAB — LIPID PANEL
Chol/HDL Ratio: 2.1 ratio (ref 0.0–5.0)
Cholesterol, Total: 185 mg/dL (ref 100–199)
HDL: 90 mg/dL (ref >39)
LDL Chol Calc (NIH): 83 mg/dL (ref 0–99)
Triglycerides: 62 mg/dL (ref 0–149)
VLDL Cholesterol Cal: 12 mg/dL (ref 5–40)

## 2021-02-15 ENCOUNTER — Telehealth: Payer: Self-pay

## 2021-02-15 DIAGNOSIS — E782 Mixed hyperlipidemia: Secondary | ICD-10-CM

## 2021-02-15 DIAGNOSIS — I25119 Atherosclerotic heart disease of native coronary artery with unspecified angina pectoris: Secondary | ICD-10-CM

## 2021-02-15 MED ORDER — ROSUVASTATIN CALCIUM 40 MG PO TABS: 20.0000 mg | ORAL_TABLET | Freq: Every day | ORAL | 3 refills | Status: DC

## 2021-02-15 NOTE — Telephone Encounter (Signed)
-----   Message from Corky Crafts, MD sent at 02/15/2021  1:09 PM EST ----- All normal labs except LDL above target.  Increase rosuvastatin to 40 mg daily.  Repeat liver and lipids in 3 months.

## 2021-02-15 NOTE — Telephone Encounter (Signed)
Patient aware of results. Patient will come in on 05/14/21 for repeat lab work. Sent in new prescription for Rosuvastatin 40 mg by mouth daily to patient's pharmacy of choice.

## 2021-04-09 ENCOUNTER — Other Ambulatory Visit: Payer: Self-pay

## 2021-04-09 DIAGNOSIS — I110 Hypertensive heart disease with heart failure: Secondary | ICD-10-CM

## 2021-04-09 DIAGNOSIS — I10 Essential (primary) hypertension: Secondary | ICD-10-CM

## 2021-04-12 ENCOUNTER — Other Ambulatory Visit: Payer: Self-pay

## 2021-04-12 DIAGNOSIS — I10 Essential (primary) hypertension: Secondary | ICD-10-CM

## 2021-04-12 DIAGNOSIS — I110 Hypertensive heart disease with heart failure: Secondary | ICD-10-CM

## 2021-04-19 ENCOUNTER — Ambulatory Visit: Payer: 59 | Admitting: Family Medicine

## 2021-04-25 ENCOUNTER — Other Ambulatory Visit: Payer: Self-pay | Admitting: Family Medicine

## 2021-04-25 DIAGNOSIS — I10 Essential (primary) hypertension: Secondary | ICD-10-CM

## 2021-04-25 DIAGNOSIS — I110 Hypertensive heart disease with heart failure: Secondary | ICD-10-CM

## 2021-04-26 NOTE — Telephone Encounter (Signed)
Requested medication (s) are due for refill today: yes ? ?Requested medication (s) are on the active medication list: yes ?Last refill:  10/19/20 ? ?Future visit scheduled: no ? ?Notes to clinic:  Unable to refill per protocol, appointment needed.  ? ? ?  ?Requested Prescriptions  ?Pending Prescriptions Disp Refills  ? lisinopril (ZESTRIL) 20 MG tablet [Pharmacy Med Name: LISINOPRIL 20MG  TABLETS] 90 tablet 1  ?  Sig: TAKE 1 TABLET(20 MG) BY MOUTH DAILY  ?  ? Cardiovascular:  ACE Inhibitors Failed - 04/25/2021  3:19 AM  ?  ?  Failed - Valid encounter within last 6 months  ?  Recent Outpatient Visits   ? ?      ? 6 months ago Hypertensive heart disease with chronic combined systolic and diastolic congestive heart failure ([place])  ? [place], Charlane Ferretti, MD  ? 1 year ago Hypertensive heart disease with chronic combined systolic and diastolic congestive heart failure ([place])  ? [place], RPH-CPP  ? 1 year ago Need for hepatitis C screening test  ? [place], [place], MD  ? 1 year ago Cardiomyopathy, ischemic  ? [place], Charlane Ferretti, MD  ? 2 years ago Chronic right-sided low back pain with right-sided sciatica  ? [place], Charlane Ferretti, MD  ? ?  ?  ? ? ?  ?  ?  Passed - Cr in normal range and within 180 days  ?  Creat  ?Date Value Ref Range Status  ?12/07/2015 0.91 0.60 - 1.35 mg/dL Final  ? ?Creatinine, Ser  ?Date Value Ref Range Status  ?02/11/2021 0.76 0.76 - 1.27 mg/dL Final  ?  ?  ?  ?  Passed - K in normal range and within 180 days  ?  Potassium  ?Date Value Ref Range Status  ?02/11/2021 4.1 3.5 - 5.2 mmol/L Final  ?  ?  ?  ?  Passed - Patient is not pregnant  ?  ?  Passed - Last BP in normal range  ?  BP Readings from Last 1 Encounters:  ?01/22/21 132/82  ?  ?  ?  ?  ? ? ?

## 2021-05-06 ENCOUNTER — Other Ambulatory Visit: Payer: Self-pay

## 2021-05-06 DIAGNOSIS — I110 Hypertensive heart disease with heart failure: Secondary | ICD-10-CM

## 2021-05-06 DIAGNOSIS — I10 Essential (primary) hypertension: Secondary | ICD-10-CM

## 2021-05-06 DIAGNOSIS — F4323 Adjustment disorder with mixed anxiety and depressed mood: Secondary | ICD-10-CM

## 2021-05-11 ENCOUNTER — Other Ambulatory Visit: Payer: Self-pay | Admitting: Family Medicine

## 2021-05-11 ENCOUNTER — Other Ambulatory Visit: Payer: Self-pay

## 2021-05-11 ENCOUNTER — Telehealth: Payer: Self-pay | Admitting: Interventional Cardiology

## 2021-05-11 DIAGNOSIS — I110 Hypertensive heart disease with heart failure: Secondary | ICD-10-CM

## 2021-05-11 DIAGNOSIS — G8929 Other chronic pain: Secondary | ICD-10-CM

## 2021-05-11 DIAGNOSIS — I10 Essential (primary) hypertension: Secondary | ICD-10-CM

## 2021-05-11 MED ORDER — ROSUVASTATIN CALCIUM 40 MG PO TABS: 40.0000 mg | ORAL_TABLET | Freq: Every day | ORAL | 1 refills | Status: DC

## 2021-05-11 MED ORDER — LISINOPRIL 20 MG PO TABS: 20.0000 mg | ORAL_TABLET | Freq: Every day | ORAL | 0 refills | Status: DC

## 2021-05-11 MED ORDER — SILDENAFIL CITRATE 20 MG PO TABS: ORAL_TABLET | ORAL | 3 refills | Status: DC

## 2021-05-11 MED ORDER — BUPROPION HCL ER (XL) 150 MG PO TB24: 150.0000 mg | ORAL_TABLET | Freq: Every day | ORAL | 0 refills | Status: DC

## 2021-05-11 NOTE — Telephone Encounter (Signed)
Pt called to report that he is completely out of his current supply, please advise  

## 2021-05-11 NOTE — Telephone Encounter (Signed)
Requested medication (s) are due for refill today - yes ? ?Requested medication (s) are on the active medication list -yes ? ?Future visit scheduled -yes ? ?Last refill: 10/19/20 #40 1RF ? ?Notes to clinic: Request RF: non delegated Rx ? ?Requested Prescriptions  ?Pending Prescriptions Disp Refills  ? traMADol (ULTRAM) 50 MG tablet 40 tablet 1  ?  Sig: Take 1 tablet (50 mg total) by mouth every 12 (twelve) hours as needed.  ?  ? Not Delegated - Analgesics:  Opioid Agonists Failed - 05/11/2021  3:38 PM  ?  ?  Failed - This refill cannot be delegated  ?  ?  Failed - Urine Drug Screen completed in last 360 days  ?  ?  Failed - Valid encounter within last 3 months  ?  Recent Outpatient Visits   ? ?      ? 6 months ago Hypertensive heart disease with chronic combined systolic and diastolic congestive heart failure (HCC)  ? [facility] [place], Odette HornsEnobong, MD  ? 1 year ago Hypertensive heart disease with chronic combined systolic and diastolic congestive heart failure (HCC)  ? [facility] Lois HuxleyVan Ausdall, Cornelius MorasStephen L, RPH-CPP  ? 1 year ago Need for hepatitis C screening test  ? [facility] Hoy RegisterNewlin, Enobong, MD  ? 1 year ago Cardiomyopathy, ischemic  ? [facility] [place], Odette HornsEnobong, MD  ? 2 years ago Chronic right-sided low back pain with right-sided sciatica  ? [facility] Hoy RegisterNewlin, Enobong, MD  ? ?  ?  ?Future Appointments   ? ?        ? In 3 months Hoy RegisterNewlin, Enobong, MD [facility]  ? ?  ? ? ?  ?  ?  ?Signed Prescriptions Disp Refills  ? buPROPion (WELLBUTRIN XL) 150 MG 24 hr tablet 90 tablet 0  ?  Sig: Take 1 tablet (150 mg total) by mouth daily.  ?  ? Psychiatry: Antidepressants - bupropion Failed - 05/11/2021  3:38 PM  ?  ?  Failed - Valid encounter within last 6 months  ?  Recent Outpatient Visits   ? ?      ? 6 months ago Hypertensive heart disease with  chronic combined systolic and diastolic congestive heart failure (HCC)  ? [facility] [place], Odette HornsEnobong, MD  ? 1 year ago Hypertensive heart disease with chronic combined systolic and diastolic congestive heart failure (HCC)  ? [facility] Lois HuxleyVan Ausdall, Cornelius MorasStephen L, RPH-CPP  ? 1 year ago Need for hepatitis C screening test  ? [facility] Hoy RegisterNewlin, Enobong, MD  ? 1 year ago Cardiomyopathy, ischemic  ? [facility] [place], Odette HornsEnobong, MD  ? 2 years ago Chronic right-sided low back pain with right-sided sciatica  ? [facility] Hoy RegisterNewlin, Enobong, MD  ? ?  ?  ?Future Appointments   ? ?        ? In 3 months Hoy RegisterNewlin, Enobong, MD [facility]  ? ?  ? ? ?  ?  ?  Passed - Cr in normal range and within 360 days  ?  Creat  ?Date Value Ref Range Status  ?12/07/2015 0.91 0.60 - 1.35 mg/dL Final  ? ?Creatinine, Ser  ?Date Value Ref Range Status  ?02/11/2021 0.76 0.76 - 1.27  mg/dL Final  ?  ?  ?  ?  Passed - AST in normal range and within 360 days  ?  AST  ?Date Value Ref Range Status  ?02/11/2021 34 0 - 40 IU/L Final  ?  ?  ?  ?  Passed - ALT in normal range and within 360 days  ?  ALT  ?Date Value Ref Range Status  ?02/11/2021 21 0 - 44 IU/L Final  ?  ?  ?  ?  Passed - Last BP in normal range  ?  BP Readings from Last 1 Encounters:  ?01/22/21 132/82  ?  ?  ?  ?  ? lisinopril (ZESTRIL) 20 MG tablet 90 tablet 0  ?  Sig: Take 1 tablet (20 mg total) by mouth daily.  ?  ? Cardiovascular:  ACE Inhibitors Failed - 05/11/2021  3:38 PM  ?  ?  Failed - Valid encounter within last 6 months  ?  Recent Outpatient Visits   ? ?      ? 6 months ago Hypertensive heart disease with chronic combined systolic and diastolic congestive heart failure (HCC)  ? [facility] [place], Odette Horns, MD  ? 1 year ago Hypertensive heart disease with chronic combined systolic  and diastolic congestive heart failure (HCC)  ? [facility] Lois Huxley, Cornelius Moras, RPH-CPP  ? 1 year ago Need for hepatitis C screening test  ? [facility] Hoy Register, MD  ? 1 year ago Cardiomyopathy, ischemic  ? [facility] [place], Odette Horns, MD  ? 2 years ago Chronic right-sided low back pain with right-sided sciatica  ? [facility] Hoy Register, MD  ? ?  ?  ?Future Appointments   ? ?        ? In 3 months Hoy Register, MD [facility]  ? ?  ? ? ?  ?  ?  Passed - Cr in normal range and within 180 days  ?  Creat  ?Date Value Ref Range Status  ?12/07/2015 0.91 0.60 - 1.35 mg/dL Final  ? ?Creatinine, Ser  ?Date Value Ref Range Status  ?02/11/2021 0.76 0.76 - 1.27 mg/dL Final  ?  ?  ?  ?  Passed - K in normal range and within 180 days  ?  Potassium  ?Date Value Ref Range Status  ?02/11/2021 4.1 3.5 - 5.2 mmol/L Final  ?  ?  ?  ?  Passed - Patient is not pregnant  ?  ?  Passed - Last BP in normal range  ?  BP Readings from Last 1 Encounters:  ?01/22/21 132/82  ?  ?  ?  ?  ? ? ? ?Requested Prescriptions  ?Pending Prescriptions Disp Refills  ? traMADol (ULTRAM) 50 MG tablet 40 tablet 1  ?  Sig: Take 1 tablet (50 mg total) by mouth every 12 (twelve) hours as needed.  ?  ? Not Delegated - Analgesics:  Opioid Agonists Failed - 05/11/2021  3:38 PM  ?  ?  Failed - This refill cannot be delegated  ?  ?  Failed - Urine Drug Screen completed in last 360 days  ?  ?  Failed - Valid encounter within last 3 months  ?  Recent Outpatient Visits   ? ?      ? 6 months ago Hypertensive heart disease with chronic combined systolic and diastolic congestive  heart failure (HCC)  ? [facility] [place], Odette Horns, MD  ? 1 year ago Hypertensive heart disease with chronic combined systolic and diastolic congestive heart failure (HCC)  ? [facility] Lois Huxley, Cornelius Moras, RPH-CPP  ? 1 year ago Need for hepatitis C screening test  ? [facility] Hoy Register, MD  ? 1 year ago Cardiomyopathy, ischemic  ? [facility] [place], Odette Horns, MD  ? 2 years ago Chronic right-sided low back pain with right-sided sciatica  ? [facility] Hoy Register, MD  ? ?  ?  ?Future Appointments   ? ?        ? In 3 months Hoy Register, MD  [facility]  ? ?  ? ? ?  ?  ?  ?Signed Prescriptions Disp Refills  ? buPROPion (WELLBUTRIN XL) 150 MG 24 hr tablet 90 tablet 0  ?  Sig: Take 1 tablet (150 mg total) by mouth daily.  ?  ? Psychiatry: Antidepressants - bupropion Failed - 05/11/2021  3:38 PM  ?  ?  Failed - Valid encounter within last 6 months  ?  Recent Outpatient Visits   ? ?      ? 6 months ago Hypertensive heart disease with chronic combined systolic and diastolic congestive heart failure (HCC)  ? [facility] [place], Odette Horns, MD  ? 1 year ago Hypertensive heart disease with chronic combined systolic and diastolic congestive heart failure (HCC)  ? [facility] Lois Huxley, Cornelius Moras, RPH-CPP  ? 1 year ago Need for hepatitis C screening test  ? [facility] Hoy Register, MD  ? 1 year ago Cardiomyopathy, ischemic  ? [facility] [place], Odette Horns, MD  ? 2 years ago Chronic right-sided low back pain with right-sided sciatica  ? [facility] Hoy Register, MD  ? ?  ?  ?Future Appointments   ? ?        ? In 3 months Hoy Register, MD [facility]  ? ?  ? ? ?  ?  ?  Passed - Cr in normal range and within 360 days  ?  Creat  ?Date Value Ref Range Status  ?12/07/2015 0.91 0.60 - 1.35 mg/dL Final  ? ?Creatinine, Ser  ?Date Value Ref Range Status  ?02/11/2021 0.76 0.76 - 1.27 mg/dL  Final  ?  ?  ?  ?  Passed - AST in normal range and within 360 days  ?  AST  ?Date Value Ref Range Status  ?02/11/2021 34 0 - 40 IU/L Final  ?  ?  ?  ?  Passed - ALT in normal range and within 360 days  ?  A

## 2021-05-11 NOTE — Telephone Encounter (Signed)
?*  STAT* If patient is at the pharmacy, call can be transferred to refill team. ? ? ?1. Which medications need to be refilled? (please list name of each medication and dose if known) sildenafil (REVATIO) 20 MG tablet ?rosuvastatin (CRESTOR) 40 MG tablet ?2. Which pharmacy/location (including street and city if local pharmacy) is medication to be sent to? [id number] [place], [place] - [address] ? ?3. Do they need a 30 day or 90 day supply? 90 day ? ?Pt needs 40 mg rosuvastatin 1 tablet daily sent to pharmacy due to increase made in Feb  ?

## 2021-05-11 NOTE — Telephone Encounter (Signed)
Patient has appointment 08/23/21- Courtesy RF given ?Requested Prescriptions  ?Pending Prescriptions Disp Refills  ?? buPROPion (WELLBUTRIN XL) 150 MG 24 hr tablet 90 tablet 1  ?  Sig: Take 1 tablet (150 mg total) by mouth daily.  ?  ? Psychiatry: Antidepressants - bupropion Failed - 05/11/2021  3:38 PM  ?  ?  Failed - Valid encounter within last 6 months  ?  Recent Outpatient Visits   ?      ? 6 months ago Hypertensive heart disease with chronic combined systolic and diastolic congestive heart failure (HCC)  ? [facility] [place], Odette Horns, MD  ? 1 year ago Hypertensive heart disease with chronic combined systolic and diastolic congestive heart failure (HCC)  ? [facility] Lois Huxley, Cornelius Moras, RPH-CPP  ? 1 year ago Need for hepatitis C screening test  ? [facility] Hoy Register, MD  ? 1 year ago Cardiomyopathy, ischemic  ? [facility] [place], Odette Horns, MD  ? 2 years ago Chronic right-sided low back pain with right-sided sciatica  ? [facility] Hoy Register, MD  ?  ?  ?Future Appointments   ?        ? In 3 months Hoy Register, MD [facility]  ?  ? ?  ?  ?  Passed - Cr in normal range and within 360 days  ?  Creat  ?Date Value Ref Range Status  ?12/07/2015 0.91 0.60 - 1.35 mg/dL Final  ? ?Creatinine, Ser  ?Date Value Ref Range Status  ?02/11/2021 0.76 0.76 - 1.27 mg/dL Final  ?   ?  ?  Passed - AST in normal range and within 360 days  ?  AST  ?Date Value Ref Range Status  ?02/11/2021 34 0 - 40 IU/L Final  ?   ?  ?  Passed - ALT in normal range and within 360 days  ?  ALT  ?Date Value Ref Range Status  ?02/11/2021 21 0 - 44 IU/L Final  ?   ?  ?  Passed - Last BP in normal range  ?  BP Readings from Last 1 Encounters:  ?01/22/21 132/82  ?   ?  ?  ?? lisinopril (ZESTRIL) 20 MG tablet 90 tablet 1  ?  Sig: Take 1 tablet (20 mg total) by  mouth daily.  ?  ? Cardiovascular:  ACE Inhibitors Failed - 05/11/2021  3:38 PM  ?  ?  Failed - Valid encounter within last 6 months  ?  Recent Outpatient Visits   ?      ? 6 months ago Hypertensive heart disease with chronic combined systolic and diastolic congestive heart failure (HCC)  ? [facility] [place], Odette Horns, MD  ? 1 year ago Hypertensive heart disease with chronic combined systolic and diastolic congestive heart failure (HCC)  ? [facility] Lois Huxley, Cornelius Moras, RPH-CPP  ? 1 year ago Need for hepatitis C screening test  ? [facility] Hoy Register, MD  ? 1 year ago Cardiomyopathy, ischemic  ? [facility] [place], Odette Horns, MD  ? 2 years ago Chronic right-sided low back pain with right-sided sciatica  ? [facility] Hoy Register, MD  ?  ?  ?Future Appointments   ?        ?  In 3 months Hoy Register, MD [facility]  ?  ? ?  ?  ?  Passed - Cr in normal range and within 180 days  ?  Creat  ?Date Value Ref Range Status  ?12/07/2015 0.91 0.60 - 1.35 mg/dL Final  ? ?Creatinine, Ser  ?Date Value Ref Range Status  ?02/11/2021 0.76 0.76 - 1.27 mg/dL Final  ?   ?  ?  Passed - K in normal range and within 180 days  ?  Potassium  ?Date Value Ref Range Status  ?02/11/2021 4.1 3.5 - 5.2 mmol/L Final  ?   ?  ?  Passed - Patient is not pregnant  ?  ?  Passed - Last BP in normal range  ?  BP Readings from Last 1 Encounters:  ?01/22/21 132/82  ?   ?  ?  ?? traMADol (ULTRAM) 50 MG tablet 40 tablet 1  ?  Sig: Take 1 tablet (50 mg total) by mouth every 12 (twelve) hours as needed.  ?  ? Not Delegated - Analgesics:  Opioid Agonists Failed - 05/11/2021  3:38 PM  ?  ?  Failed - This refill cannot be delegated  ?  ?  Failed - Urine Drug Screen completed in last 360 days  ?  ?  Failed - Valid encounter within last 3 months  ?  Recent Outpatient Visits    ?      ? 6 months ago Hypertensive heart disease with chronic combined systolic and diastolic congestive heart failure (HCC)  ? [facility] [place], Odette Horns, MD  ? 1 year ago Hypertensive heart disease with chronic combined systolic and diastolic congestive heart failure (HCC)  ? [facility] Lois Huxley, Cornelius Moras, RPH-CPP  ? 1 year ago Need for hepatitis C screening test  ? [facility] Hoy Register, MD  ? 1 year ago Cardiomyopathy, ischemic  ? [facility] [place], Odette Horns, MD  ? 2 years ago Chronic right-sided low back pain with right-sided sciatica  ? [facility] Hoy Register, MD  ?  ?  ?Future Appointments   ?        ? In 3 months Hoy Register, MD [facility]  ?  ? ?  ?  ?  ? ?

## 2021-05-11 NOTE — Telephone Encounter (Signed)
?  lisinopril (ZESTRIL) 20 MG tablet 90 tablet 1 10/19/2020    ?Sig - Route: Take 1 tablet (20 mg total) by mouth daily. - Oral   ?Sent to pharmacy as: lisinopril (ZESTRIL) 20 MG tablet   ?Notes to Pharmacy: Dose increas   ? ? ?buPROPion (WELLBUTRIN XL) 150 MG 24 hr tablet 90 tablet 1 10/19/2020    ?Sig - Route: Take 1 tablet (150 mg total) by mouth daily. - Oral   ?Sent to pharmacy as: buPROPion (WELLBUTRIN XL) 150 MG 24 hr tablet   ?E-Prescribing Status: Receipt confirmed by pharmacy (10/19/2020  2:04 PM EDT   ? ?traMADol (ULTRAM) 50 MG tablet 40 tablet 1 10/19/2020    ?Sig - Route: Take 1 tablet (50 mg total) by mouth every 12 (twelve) hours as needed. - Oral   ?Sent to pharmacy as: traMADol (ULTRAM) 50 MG tablet   ?E-Prescribing Status: Receipt confirmed by pharmacy (10/19/2020  2:04 PM EDT   ? [id number] [place] [place] - [address]  [address] [place] [address]  ?Phone: [phone] Fax: [phone]  ?Hours: Not open 24 hours  ?Note :pt has chg pharmacy ?

## 2021-05-13 MED ORDER — TRAMADOL HCL 50 MG PO TABS: 50.0000 mg | ORAL_TABLET | Freq: Two times a day (BID) | ORAL | 1 refills | Status: AC | PRN

## 2021-05-13 NOTE — Telephone Encounter (Signed)
Will forward to provider  

## 2021-05-14 ENCOUNTER — Other Ambulatory Visit: Payer: Managed Care, Other (non HMO) | Admitting: *Deleted

## 2021-05-14 DIAGNOSIS — E782 Mixed hyperlipidemia: Secondary | ICD-10-CM

## 2021-05-14 DIAGNOSIS — I25119 Atherosclerotic heart disease of native coronary artery with unspecified angina pectoris: Secondary | ICD-10-CM

## 2021-05-14 LAB — LIPID PANEL
Chol/HDL Ratio: 2.1 ratio (ref 0.0–5.0)
Cholesterol, Total: 183 mg/dL (ref 100–199)
HDL: 88 mg/dL (ref >39)
LDL Chol Calc (NIH): 85 mg/dL (ref 0–99)
Triglycerides: 49 mg/dL (ref 0–149)
VLDL Cholesterol Cal: 10 mg/dL (ref 5–40)

## 2021-05-14 LAB — HEPATIC FUNCTION PANEL
ALT: 134 IU/L — ABNORMAL HIGH (ref 0–44)
AST: 95 IU/L — ABNORMAL HIGH (ref 0–40)
Albumin: 4.7 g/dL (ref 3.8–4.9)
Alkaline Phosphatase: 53 IU/L (ref 44–121)
Bilirubin Total: 0.8 mg/dL (ref 0.0–1.2)
Bilirubin, Direct: 0.25 mg/dL (ref 0.00–0.40)
Total Protein: 7.7 g/dL (ref 6.0–8.5)

## 2021-05-19 ENCOUNTER — Telehealth: Payer: Self-pay | Admitting: Pharmacist

## 2021-05-19 NOTE — Telephone Encounter (Signed)
I spoke with patient. He says that he does drink alcohol- drinks 3 beers and a few shots most nights. His LFT have been normal until now (increased rosuvastatin to 40mg  3 months ago). I have asked him to hold his rosuvastatin. He is agreeable to meet with in the lipid clinic. I have asked him to decrease his alcohol intake. Will recheck his FLT on 6/5 at visit. Could consider rechallenge with lower dose if improved. But he will still need additional medication. ?

## 2021-05-20 ENCOUNTER — Telehealth: Payer: Self-pay

## 2021-05-20 DIAGNOSIS — I25119 Atherosclerotic heart disease of native coronary artery with unspecified angina pectoris: Secondary | ICD-10-CM

## 2021-05-20 NOTE — Telephone Encounter (Signed)
**Note De-Identified Thomas Woods Obfuscation** Sildenafil PA started through covermymeds. ?Key: [id number] ?

## 2021-05-27 ENCOUNTER — Other Ambulatory Visit: Payer: Self-pay | Admitting: Family Medicine

## 2021-05-27 DIAGNOSIS — I10 Essential (primary) hypertension: Secondary | ICD-10-CM

## 2021-05-27 DIAGNOSIS — I110 Hypertensive heart disease with heart failure: Secondary | ICD-10-CM

## 2021-05-27 NOTE — Telephone Encounter (Signed)
Requested too early. Sent via Interface Requested Prescriptions  Pending Prescriptions Disp Refills  . lisinopril (ZESTRIL) 20 MG tablet [Pharmacy Med Name: LISINOPRIL 20MG  TABLETS] 90 tablet 0    Sig: TAKE 1 TABLET(20 MG) BY MOUTH DAILY     Cardiovascular:  ACE Inhibitors Failed - 05/27/2021 12:12 PM      Failed - Valid encounter within last 6 months    Recent Outpatient Visits          7 months ago Hypertensive heart disease with chronic combined systolic and diastolic congestive heart failure (HCC)   [place] [facility] [place], [place], MD   1 year ago Hypertensive heart disease with chronic combined systolic and diastolic congestive heart failure [facility])   [facility] [place], KOOMBERKINE, RPH-CPP   1 year ago Need for hepatitis C screening test   [facility] [place], [place], MD   1 year ago Cardiomyopathy, ischemic   [place] [place], [place], MD   2 years ago Chronic right-sided low back pain with right-sided sciatica   [place] [facility] [place], [place], MD      Future Appointments            In 2 weeks  [facility] [address] [facility], [address]   In 2 months [address], [place] [facility]           Passed - Cr in normal range and within 180 days    Creat  Date Value Ref Range Status  12/07/2015 0.91 0.60 - 1.35 mg/dL Final   Creatinine, Ser  Date Value Ref Range Status  02/11/2021 0.76 0.76 - 1.27 mg/dL Final         Passed - K in normal range and within 180 days    Potassium  Date Value Ref Range Status  02/11/2021 4.1 3.5 - 5.2 mmol/L Final         Passed - Patient is not pregnant      Passed - Last BP in normal range    BP Readings from Last 1 Encounters:  01/22/21 132/82

## 2021-06-05 ENCOUNTER — Other Ambulatory Visit: Payer: Self-pay | Admitting: Family Medicine

## 2021-06-05 DIAGNOSIS — I10 Essential (primary) hypertension: Secondary | ICD-10-CM

## 2021-06-05 DIAGNOSIS — I110 Hypertensive heart disease with heart failure: Secondary | ICD-10-CM

## 2021-06-13 NOTE — Progress Notes (Unsigned)
Patient ID: [name]                 DOB: 1965/05/22                    MRN: [id number]     HPI: Thomas Woods is a 56 y.o. male patient referred to lipid clinic by Dr. Irish Lack. PMH is significant for CAD, HLD, HTN, LV thrombus in 2016, and ischemic cardiomyopathy. Had anterior NSTEMI 03/2014 with 2 DES to the LAD. At that time EF was 25%, but improved to 50% by 06/2014. Patient was previously taking Crestor 40 mg daily, however, lab results on 05/14/21 showed elevated LFTs. Patient reported drinking 3 beers and a few shots nightly at that time. Patient was referred to optimize lipid lowering therapy in the setting of LFT elevation.  Ask about diet/exercise Ask about alcohol/tobacco use Clarify atorva intolerance Verify patient increased dose of rosuva  Plan: new LFT labs, assess willingness to restart Crestor at lower dose. If not, may consider PCSK9 if amenable.    Current Medications:  Crestor 40 mg (held due to LFTs) Intolerances: Atorvastatin 80 mg daily (unknown) Risk Factors:  LDL goal: <70  Diet:   Exercise:   Family History: No significant family history noted  Social History: Current daily smoker, current alcohol use  Labs:  Lipid panel 05/14/21 on Crestor 40 mg TC 183, TG 49, HDL 88, CLDL 10, LDL 85  Hepatic function panel 05/14/21 Tbili 0.8, Dbili 0.25, Alk phos 53, AST 95, ALT 134  Lipid panel 02/11/21 on Crestor 20 mg TC 185, TG 62, HDL 90, VLDL 12, LDL 83  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 ([place]) 03/27/2014   On Coumadin   NSTEMI (non-ST elevated myocardial infarction) ([place]) 03/26/2014   Patient pain increased and he developed ECG changes, taken to the cath lab as a STEMI, PCI to the LAD     Current Outpatient Medications on File Prior to Visit  Medication Sig Dispense Refill   buPROPion (WELLBUTRIN XL) 150 MG 24 hr tablet Take 1 tablet (150 mg total) by mouth daily. 90 tablet 0   busPIRone (BUSPAR) 10 MG tablet TAKE 1 TABLET(10 MG) BY MOUTH TWICE DAILY 180 tablet 0   carvedilol (COREG) 12.5 MG tablet Take 1 tablet (12.5 mg total) by mouth 2 (two) times daily. 180 tablet 3   clopidogrel (PLAVIX) 75 MG tablet Take 1 tablet (75 mg total) by mouth daily. Please keep upcoming appointment in January 2023 for future refills 90 tablet 3   gabapentin (NEURONTIN) 300 MG capsule TAKE 1 CAPSULE BY MOUTH TWICE DAILY 180 capsule 0   lisinopril (ZESTRIL) 20 MG tablet TAKE 1 TABLET BY MOUTH EVERY DAY 90 tablet 0   nitroGLYCERIN (NITROSTAT) 0.4 MG SL tablet Place 1 tablet (0.4 mg total) under the tongue every 5 (five) minutes as needed for chest pain (x 3 doses). 25 tablet 3   rosuvastatin (CRESTOR) 40 MG tablet Take 1 tablet (40 mg total) by mouth daily. 90 tablet 1   sildenafil (REVATIO) 20 MG tablet TAKE 3 TO 5 TABLETS BY MOUTH ONCE DAILY AS NEEDED 1  HOUR  PRIOR  TO  SEXUAL  ACTIVITY 30 tablet 3   traMADol (ULTRAM) 50 MG tablet Take 1 tablet (50 mg total) by mouth every 12 (twelve) hours as needed. 40 tablet 1   No current facility-administered medications on file prior to visit.    No Known Allergies  Assessment/Plan:  1. Hyperlipidemia -

## 2021-06-14 ENCOUNTER — Other Ambulatory Visit: Payer: Self-pay

## 2021-06-14 ENCOUNTER — Ambulatory Visit (INDEPENDENT_AMBULATORY_CARE_PROVIDER_SITE_OTHER): Payer: Commercial Managed Care - HMO | Admitting: Pharmacist

## 2021-06-14 DIAGNOSIS — N529 Male erectile dysfunction, unspecified: Secondary | ICD-10-CM | POA: Diagnosis not present

## 2021-06-14 DIAGNOSIS — E782 Mixed hyperlipidemia: Secondary | ICD-10-CM | POA: Diagnosis not present

## 2021-06-14 DIAGNOSIS — I255 Ischemic cardiomyopathy: Secondary | ICD-10-CM

## 2021-06-14 DIAGNOSIS — Z720 Tobacco use: Secondary | ICD-10-CM | POA: Diagnosis not present

## 2021-06-14 LAB — HEPATIC FUNCTION PANEL
ALT: 32 IU/L (ref 0–44)
AST: 42 IU/L — ABNORMAL HIGH (ref 0–40)
Albumin: 4.7 g/dL (ref 3.8–4.9)
Alkaline Phosphatase: 51 IU/L (ref 44–121)
Bilirubin Total: 1.3 mg/dL — ABNORMAL HIGH (ref 0.0–1.2)
Bilirubin, Direct: 0.3 mg/dL (ref 0.00–0.40)
Total Protein: 7.4 g/dL (ref 6.0–8.5)

## 2021-06-14 MED ORDER — SILDENAFIL CITRATE 50 MG PO TABS: ORAL_TABLET | ORAL | 0 refills | Status: DC

## 2021-06-14 MED ORDER — TADALAFIL 10 MG PO TABS: 10.0000 mg | ORAL_TABLET | Freq: Every day | ORAL | 3 refills | Status: DC | PRN

## 2021-06-14 MED ORDER — CHANTIX STARTING MONTH PAK 0.5 MG X 11 & 1 MG X 42 PO TBPK: ORAL_TABLET | ORAL | 0 refills | Status: DC

## 2021-06-14 MED ORDER — CLOPIDOGREL BISULFATE 75 MG PO TABS: 75.0000 mg | ORAL_TABLET | Freq: Every day | ORAL | 3 refills | Status: DC

## 2021-06-14 MED ORDER — CARVEDILOL 12.5 MG PO TABS: 12.5000 mg | ORAL_TABLET | Freq: Two times a day (BID) | ORAL | 3 refills | Status: DC

## 2021-06-14 NOTE — Telephone Encounter (Signed)
**Note De-Identified Sharlie Shreffler Obfuscation** Tadalafil PA started through covermymeds. Key: [id number]

## 2021-06-14 NOTE — Telephone Encounter (Signed)
Pt came for lipid appt today and mentioned he received a denial letter from his insurance for sildenafil. Rx had been for 20mg  tablet which is only approved for pulmonary HTN. , are you able to submit a prior auth for the 50mg  tablet take 1-2 tablets as needed instead to see if it's covered? This mg strength is the generic for Viagra so may be covered better.

## 2021-06-14 NOTE — Telephone Encounter (Signed)
**Note De-Identified Thomas Woods Obfuscation** I changed the pts Sildenafil mg to 50-take 1 to 2 tablets as needed and [facility]. [facility] and was advised that a PA is required as Sildenafil is non-formulary on the pts plan.  I attempted a Sildenafil 50 mg PA through covermymeds and received a message that the covered alternative is generic tadalafil.  I s/w Fuller [place], RPH who advised:  tadalafil should be fine to try, it's the generic for cialis would submit for 10mg  tablet as needed  I have removed Sildenafil from the pts med list and added Tadalafil 10 mg and then [facility]10 with 3 refills.  [facility] and was advised that they are no longer getting a Non formulary message when they  run the RX but that they are receiving a message that a PA is required for Tadalafil. I will attempts a Tadalafil PA now.

## 2021-06-14 NOTE — Patient Instructions (Signed)
Your LDL cholesterol is 85 and your goal is < 55  We'll recheck your liver enzymes today and if they've normalized, we'll restart your rosuvastatin  We'll also send information over to your insurance to cover Nexlizet which is a once daily cholesterol pill that lowers your LDL by an extra 40%

## 2021-06-15 ENCOUNTER — Telehealth: Payer: Self-pay | Admitting: Pharmacist

## 2021-06-15 DIAGNOSIS — E782 Mixed hyperlipidemia: Secondary | ICD-10-CM

## 2021-06-15 MED ORDER — ROSUVASTATIN CALCIUM 20 MG PO TABS: 20.0000 mg | ORAL_TABLET | Freq: Every day | ORAL | 3 refills | Status: DC

## 2021-06-15 NOTE — Telephone Encounter (Addendum)
LFTs have declined since reduction in alcohol use and stopping statin. Given mild complaints of joint pain after increasing Crestor from 20 mg to 40 mg daily and previous LFT elevation, will start Rosuvastatin back at 20 mg daily. Patient made aware and advised not to increase alcohol consumption. Patient also reported picking up Chantix and tolerating it well. Mentioned being out of buspirone and was informed to contact PCP. Scheduled appointment to recheck hepatic panel on 07/30/21. PA for Nexlizet is still in process.

## 2021-06-17 MED ORDER — TADALAFIL 10 MG PO TABS: 10.0000 mg | ORAL_TABLET | Freq: Every day | ORAL | 3 refills | Status: DC | PRN

## 2021-06-17 NOTE — Telephone Encounter (Signed)
**Note De-Identified Blayne Frankie Obfuscation** Letter received from [facility] stating that they denied coverage of Tadalafil 10 mg for # 10 tablets because the plan only cover #8 tablets in a 30 day period.  I changed the RX to Tadalafil 10 mg with a quantity of #8 and [id number] [place], [place] - [address] (Ph: [phone]).  [facility] and was advised that the pts cost for Tadalafil is now $0.  No answer when I called the pt so I left a VM asking him to call Larita Fife back at Dr Hoyle Barr office at [phone].

## 2021-06-18 ENCOUNTER — Telehealth: Payer: Self-pay | Admitting: Interventional Cardiology

## 2021-06-18 NOTE — Telephone Encounter (Signed)
**Note De-Identified Coriana Angello Obfuscation** No answer so I left another VM asking the pt to call Larita Fife at Dr Hoyle Barr office at [facility] at [phone].

## 2021-06-18 NOTE — Telephone Encounter (Signed)
**Note De-Identified Thomas Woods Obfuscation** The pt is aware that Sildenafil was changed to Tadalafil 10 mg due to his plan's coverage preference and that [employer] only allows #8 tablets per a 30 day period. He is also aware that his Tadalafil [facility] at no charge to him. He expressed much gratitude for my assistance with this PA.

## 2021-06-18 NOTE — Telephone Encounter (Signed)
Pt returning nurses call. Call transferred 

## 2021-06-18 NOTE — Telephone Encounter (Signed)
I returned call to patient. He was returning call to [name] and he states he just spoke with her.

## 2021-06-21 ENCOUNTER — Ambulatory Visit: Payer: Commercial Managed Care - HMO | Admitting: Nurse Practitioner

## 2021-06-21 MED ORDER — NEXLIZET 180-10 MG PO TABS: 1.0000 | ORAL_TABLET | Freq: Every day | ORAL | 3 refills | Status: DC

## 2021-06-21 NOTE — Progress Notes (Deleted)
Office Visit    Patient Name: Thomas Woods Date of Encounter: 06/21/2021  Primary Care Provider:  Hoy Register, MD Primary Cardiologist:  Lance Muss, MD  Chief Complaint    56 year old male with a history of CAD s/p NSTEMI, DESx2 overlapping-LAD in 2016, ICM, apical thrombus, hypertension, hyperlipidemia, ED, and tobacco use who presents for follow-up related to CAD.  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   On Coumadin   NSTEMI (non-ST elevated myocardial infarction) (HCC) 03/26/2014   Patient pain increased and he developed ECG changes, taken to the cath lab as a STEMI, PCI to the LAD   Past Surgical History:  Procedure Laterality Date   HERNIA REPAIR     LEFT HEART CATHETERIZATION WITH CORONARY ANGIOGRAM N/A 03/27/2014   Procedure: LEFT HEART CATHETERIZATION WITH CORONARY ANGIOGRAM;  Surgeon: Corky Crafts, MD; L main okay, LAD 100%, CFX okay, RI moderate disease, OM1 mild disease, RCA moderate disease, EF 30%   PERCUTANEOUS CORONARY STENT INTERVENTION (PCI-S)  03/27/2014   3.0 x 32 Synergy overlapped with 2.5 x 38 Synergy to the mLAD    Allergies  No Known Allergies  History of Present Illness    55 year old male with the above past medical history including CAD s/p NSTEMI, DESx2 overlapping-LAD in 2016, ICM, apical thrombus, hypertension, hyperlipidemia, ED, and tobacco use.  He was hospitalized in March 2016 in the setting of anterior MI.  His angina was a burning sensation.  He had 2 long overlapping DES to the LAD.  EF was 30% at the time.  Additionally, he had an apical thrombus and was treated with warfarin for a few months.  Repeat  echocardiogram in June 2016 showed EF 50 to 55%, low normal LV systolic function, hypokinesis of apical myocardium, G1 DD, no evidence of thrombus, trivial aortic valve regurgitation.  He was last seen in the office on 01/22/2021 and was stable overall from a cardiac standpoint.  He denies symptoms concerning for angina.  He did report ongoing tobacco use.  Appointment with lipid clinic Pharm.D., LFTs were noted to be elevated.  Additionally, he noted joint pain in his bilateral upper and lower extremities that coincided with increase in statin dose.  Rosuvastatin was subsequently held.  Alcohol reduction was recommended.  His LFTs stabilized and rosuvastatin was reintroduced at a lower dose, 20 mg daily.  Additionally, he was started on Nexlizet as he wished to avoid injectable medications.    He presents today for follow-up.  Since his last visit  CAD: ICM: Hypertension: Hyperlipidemia: Tobacco use: Disposition: Home Medications    Current Outpatient Medications  Medication Sig Dispense Refill   Bempedoic Acid-Ezetimibe (NEXLIZET) 180-10 MG TABS Take 1 tablet by mouth daily. 90 tablet 3   buPROPion (WELLBUTRIN XL) 150 MG 24 hr tablet Take 1 tablet (150 mg total) by mouth daily. 90 tablet 0   busPIRone (BUSPAR) 10 MG tablet TAKE 1 TABLET(10 MG) BY MOUTH TWICE DAILY 180 tablet 0   carvedilol (COREG) 12.5 MG tablet Take 1 tablet (12.5 mg total) by mouth 2 (two) times daily. 180 tablet 3   clopidogrel (PLAVIX) 75 MG tablet  Take 1 tablet (75 mg total) by mouth daily. 90 tablet 3   gabapentin (NEURONTIN) 300 MG capsule TAKE 1 CAPSULE BY MOUTH TWICE DAILY 180 capsule 0   lisinopril (ZESTRIL) 20 MG tablet TAKE 1 TABLET BY MOUTH EVERY DAY 90 tablet 0   nitroGLYCERIN (NITROSTAT) 0.4 MG SL tablet Place 1 tablet (0.4 mg total) under the tongue every 5 (five) minutes as needed for chest pain (x 3 doses). 25 tablet 3   rosuvastatin (CRESTOR) 20 MG tablet Take 1 tablet (20 mg total) by mouth daily. 90  tablet 3   tadalafil (CIALIS) 10 MG tablet Take 1 tablet (10 mg total) by mouth daily as needed for erectile dysfunction. 8 tablet 3   traMADol (ULTRAM) 50 MG tablet Take 1 tablet (50 mg total) by mouth every 12 (twelve) hours as needed. 40 tablet 1   Varenicline Tartrate, Starter, (CHANTIX STARTING MONTH PAK) 0.5 MG X 11 & 1 MG X 42 TBPK Take 0.5 mg by mouth once daily on Days 1 through 3, then 0.5 mg twice daily on Days 4 through 7, and finally 1 mg twice daily on day 8 until the end of treatment 42 each 0   No current facility-administered medications for this visit.     Review of Systems    ***.  All other systems reviewed and are otherwise negative except as noted above.    Physical Exam    VS:  There were no vitals taken for this visit. , BMI There is no height or weight on file to calculate BMI.     GEN: Well nourished, well developed, in no acute distress. HEENT: normal. Neck: Supple, no JVD, carotid bruits, or masses. Cardiac: RRR, no murmurs, rubs, or gallops. No clubbing, cyanosis, edema.  Radials/DP/PT 2+ and equal bilaterally.  Respiratory:  Respirations regular and unlabored, clear to auscultation bilaterally. GI: Soft, nontender, nondistended, BS + x 4. MS: no deformity or atrophy. Skin: warm and dry, no rash. Neuro:  Strength and sensation are intact. Psych: Normal affect.  Accessory Clinical Findings    ECG personally reviewed by me today - *** - no acute changes.  Lab Results  Component Value Date   WBC 4.4 02/11/2021   HGB 14.7 02/11/2021   HCT 43.6 02/11/2021   MCV 95 02/11/2021   PLT 182 02/11/2021   Lab Results  Component Value Date   CREATININE 0.76 02/11/2021   BUN 9 02/11/2021   NA 144 02/11/2021   K 4.1 02/11/2021   CL 104 02/11/2021   CO2 26 02/11/2021   Lab Results  Component Value Date   ALT 32 06/14/2021   AST 42 (H) 06/14/2021   ALKPHOS 51 06/14/2021   BILITOT 1.3 (H) 06/14/2021   Lab Results  Component Value Date   CHOL 183  05/14/2021   HDL 88 05/14/2021   LDLCALC 85 05/14/2021   TRIG 49 05/14/2021   CHOLHDL 2.1 05/14/2021    No results found for: "HGBA1C"  Assessment & Plan    1.  ***   Joylene Grapes, NP 06/21/2021, 9:24 AM

## 2021-06-21 NOTE — Telephone Encounter (Signed)
Attempted call to inform pt that Nexlizet PA was approved and [facility] [place]. Copay card was called in as well. VM left to call patient back when available.

## 2021-06-24 ENCOUNTER — Telehealth: Payer: Self-pay | Admitting: Pharmacist

## 2021-06-24 MED ORDER — EZETIMIBE 10 MG PO TABS
10.0000 mg | ORAL_TABLET | Freq: Every day | ORAL | 3 refills | Status: DC
Start: 2021-06-24 — End: 2021-07-26

## 2021-06-24 NOTE — Telephone Encounter (Signed)
Spoke to pt who informed me that he is not able to afford the $72 copay for Nexlizet. Spoke to insurance on the phone for ~1 hour and they told me that the reason for increased cost is a 10% coinsurance. The cost is 197 without the copay card and $72 with the copay card. Attempted to appeal for coinsurance acception with insruance, but pt is not eligible. Discussed alternative options of Zetia 10 mg daily vs submitting another PA for Repatha and using a copay card as it pays more than Nexlizet. Pt prefers to try Zetia at this time, but is willing to try Repatha down the road if LDL is not at goal on Zetia. Start Zetia 10 mg daily. LFTs scheduled for July, will schedule follow up lipid panel when PharmD calls to discuss LFT results.

## 2021-06-25 ENCOUNTER — Encounter: Payer: Self-pay | Admitting: Nurse Practitioner

## 2021-06-28 NOTE — Addendum Note (Signed)
Addended by: Izzy Courville E on: 06/28/2021 09:25 AM   Modules accepted: Orders

## 2021-07-05 ENCOUNTER — Other Ambulatory Visit: Payer: Self-pay

## 2021-07-05 DIAGNOSIS — I255 Ischemic cardiomyopathy: Secondary | ICD-10-CM

## 2021-07-05 DIAGNOSIS — I10 Essential (primary) hypertension: Secondary | ICD-10-CM

## 2021-07-05 DIAGNOSIS — I110 Hypertensive heart disease with heart failure: Secondary | ICD-10-CM

## 2021-07-05 MED ORDER — LISINOPRIL 20 MG PO TABS: 20.0000 mg | ORAL_TABLET | Freq: Every day | ORAL | 1 refills | Status: DC

## 2021-07-05 MED ORDER — VARENICLINE TARTRATE 1 MG PO TABS: 1.0000 mg | ORAL_TABLET | Freq: Two times a day (BID) | ORAL | 1 refills | Status: DC

## 2021-07-05 MED ORDER — TADALAFIL 10 MG PO TABS: 10.0000 mg | ORAL_TABLET | Freq: Every day | ORAL | 0 refills | Status: DC | PRN

## 2021-07-05 MED ORDER — ROSUVASTATIN CALCIUM 20 MG PO TABS: 20.0000 mg | ORAL_TABLET | Freq: Every day | ORAL | 1 refills | Status: DC

## 2021-07-05 MED ORDER — CLOPIDOGREL BISULFATE 75 MG PO TABS: 75.0000 mg | ORAL_TABLET | Freq: Every day | ORAL | 1 refills | Status: DC

## 2021-07-05 MED ORDER — CARVEDILOL 12.5 MG PO TABS: 12.5000 mg | ORAL_TABLET | Freq: Two times a day (BID) | ORAL | 1 refills | Status: DC

## 2021-07-05 NOTE — Telephone Encounter (Signed)
Refill request is from mail order.  Continuing pack of Chantix with one refill sent.  Three month supply (24 tablets) of Tadalafil sent

## 2021-07-08 ENCOUNTER — Telehealth: Payer: Self-pay | Admitting: *Deleted

## 2021-07-08 NOTE — Telephone Encounter (Signed)
Received a message from pt's pharmacy, [facility].  Chantix, all strengths and forms, are unavailable due to a recall.  They are asking for alternative and suggested possibly Varenicline.  Please advise!

## 2021-07-12 NOTE — Telephone Encounter (Signed)
Caller stated the medication varenicline (CHANTIX CONTINUING MONTH PAK) 1 MG tablet  has been discontinued by the manufacturer.  They are requesting alternate medication.  Ref#  [id number].  Caller stated they will need a response by Wed. 7/5 by 2 pm.  Please advise.

## 2021-07-12 NOTE — Telephone Encounter (Signed)
Ok to switch to generic Varenicline if that is in stock

## 2021-07-13 NOTE — Telephone Encounter (Signed)
OK with me to switch to Welbutrin.  Will defer to [facility].

## 2021-07-14 NOTE — Telephone Encounter (Signed)
Would verify patient can pick up generic Chantix first since he is currently taking it

## 2021-07-19 MED ORDER — VARENICLINE TARTRATE 1 MG PO TABS: 1.0000 mg | ORAL_TABLET | Freq: Two times a day (BID) | ORAL | 2 refills | Status: DC

## 2021-07-19 NOTE — Telephone Encounter (Signed)
I spoke with [facility].  Packs are not available.  Can be sent as Varenicline. Will send in as Varenicline 1 mg twice daily

## 2021-07-20 ENCOUNTER — Telehealth: Payer: Self-pay | Admitting: Interventional Cardiology

## 2021-07-20 DIAGNOSIS — Z79899 Other long term (current) drug therapy: Secondary | ICD-10-CM

## 2021-07-20 DIAGNOSIS — E782 Mixed hyperlipidemia: Secondary | ICD-10-CM

## 2021-07-20 NOTE — Telephone Encounter (Signed)
Spoke with patient who reports he wasn't sure if Zetia or rosuvastatin was causing his joint pain. Patient did not that severe joint pain did not start until after adding ezetimibe (Zetia).   Advised patient on pharmacist's recommendation: Please have patient stop taking ezetimibe. Please have him come in for CK and LFT.  Patient verbalized understanding. Added CK to labs scheduled for 07/30/21 (lipid, LFTs) and rescheduled lab appt for 07/23/21.

## 2021-07-20 NOTE — Telephone Encounter (Signed)
Left message for patient to return call.  Will forward to Pharm D to advise.

## 2021-07-20 NOTE — Telephone Encounter (Signed)
Please have patient stop taking ezetimibe. Please have him come in for CK and LFT.

## 2021-07-20 NOTE — Telephone Encounter (Signed)
Pt c/o medication issue:  1. Name of Medication: Ezetimibe  2. How are you currently taking this medication (dosage and times per day)? 1 time a day  3. Are you having a reaction (difficulty breathing--STAT)?   4. What is your medication issue? severe  joint pains- can barely walk

## 2021-07-23 ENCOUNTER — Other Ambulatory Visit: Payer: Commercial Managed Care - HMO

## 2021-07-23 DIAGNOSIS — I25119 Atherosclerotic heart disease of native coronary artery with unspecified angina pectoris: Secondary | ICD-10-CM

## 2021-07-23 DIAGNOSIS — E782 Mixed hyperlipidemia: Secondary | ICD-10-CM

## 2021-07-23 DIAGNOSIS — Z79899 Other long term (current) drug therapy: Secondary | ICD-10-CM

## 2021-07-23 LAB — HEPATIC FUNCTION PANEL
ALT: 34 IU/L (ref 0–44)
AST: 35 IU/L (ref 0–40)
Albumin: 4.7 g/dL (ref 3.8–4.9)
Alkaline Phosphatase: 50 IU/L (ref 44–121)
Bilirubin Total: 1.3 mg/dL — ABNORMAL HIGH (ref 0.0–1.2)
Bilirubin, Direct: 0.35 mg/dL (ref 0.00–0.40)
Total Protein: 7.4 g/dL (ref 6.0–8.5)

## 2021-07-23 LAB — LIPID PANEL
Chol/HDL Ratio: 2.2 ratio (ref 0.0–5.0)
Cholesterol, Total: 170 mg/dL (ref 100–199)
HDL: 78 mg/dL (ref >39)
LDL Chol Calc (NIH): 82 mg/dL (ref 0–99)
Triglycerides: 50 mg/dL (ref 0–149)
VLDL Cholesterol Cal: 10 mg/dL (ref 5–40)

## 2021-07-23 LAB — CK: Total CK: 83 U/L (ref 41–331)

## 2021-07-24 ENCOUNTER — Other Ambulatory Visit: Payer: Self-pay | Admitting: Family Medicine

## 2021-07-26 ENCOUNTER — Telehealth: Payer: Self-pay | Admitting: Pharmacist

## 2021-07-26 ENCOUNTER — Encounter: Payer: Self-pay | Admitting: Pharmacist

## 2021-07-26 NOTE — Telephone Encounter (Signed)
Pt experienced joint pain on ezetimibe and stopped taking it a week ago so his lipids are relatively unchanged. Feeling better now. Still taking rosuvastatin 20mg  daily and tolerating well. Spoke with pt, he is agreeable to try Repatha. His LDL goal is < 55 due to premature ASCVD. Will submit prior auth and follow up with pt once approved.  Will need to confirm copay is ok since his insurance did not cover Nexlizet well. If they do, pt prefers to stop by the office so we can help with his first injection and review technique.

## 2021-07-26 NOTE — Telephone Encounter (Signed)
Requested Prescriptions  Pending Prescriptions Disp Refills  . buPROPion (WELLBUTRIN XL) 150 MG 24 hr tablet [Pharmacy Med Name: BUPROPION XL 150MG  TABLETS (24 H)] 90 tablet 0    Sig: TAKE 1 TABLET(150 MG) BY MOUTH DAILY     Psychiatry: Antidepressants - bupropion Failed - 07/24/2021  6:37 AM      Failed - Valid encounter within last 6 months    Recent Outpatient Visits          9 months ago Hypertensive heart disease with chronic combined systolic and diastolic congestive heart failure (HCC)   [facility] [place], [place], MD   1 year ago Hypertensive heart disease with chronic combined systolic and diastolic congestive heart failure [facility])   [facility], Lois Huxley, RPH-CPP   1 year ago Need for hepatitis C screening test   [facility] [place], [place], MD   1 year ago Cardiomyopathy, ischemic   [facility] [place], [place], MD   2 years ago Chronic right-sided low back pain with right-sided sciatica   [facility] [place], [place], MD      Future Appointments            In 4 weeks Odette Horns, MD [facility]           Passed - Cr in normal range and within 360 days    Creat  Date Value Ref Range Status  12/07/2015 0.91 0.60 - 1.35 mg/dL Final   Creatinine, Ser  Date Value Ref Range Status  02/11/2021 0.76 0.76 - 1.27 mg/dL Final         Passed - AST in normal range and within 360 days    AST  Date Value Ref Range Status  07/23/2021 35 0 - 40 IU/L Final         Passed - ALT in normal range and within 360 days    ALT  Date Value Ref Range Status  07/23/2021 34 0 - 44 IU/L Final         Passed - Last BP in normal range    BP Readings from Last 1 Encounters:  01/22/21 132/82

## 2021-07-27 MED ORDER — REPATHA SURECLICK 140 MG/ML ~~LOC~~ SOAJ: 1.0000 | SUBCUTANEOUS | 11 refills | Status: DC

## 2021-07-27 MED ORDER — ROSUVASTATIN CALCIUM 40 MG PO TABS: 40.0000 mg | ORAL_TABLET | Freq: Every day | ORAL | 3 refills | Status: DC

## 2021-07-27 NOTE — Telephone Encounter (Signed)
Repatha PA approved through 07/26/22.  Activated $5 copay card: Johnney Killian: [id number] PCN: [id number] GRP: [id number] ID: [id number]  Rx sent to pharmacy to confirm pricing as Nexlizet was previously unaffordable even with copay card due to his % copay on his plan. Apparently he cannot fill at [facility], [facility]. New rx sent in there.

## 2021-07-27 NOTE — Telephone Encounter (Signed)
Called pharmacy, 1 month Repatha copay is $274 even after copay card, [id number] without the copay card but still being processed through his insurance. Nexletol was also unaffordable even with copay card, pt's insurance charged him a % copay instead of a set dollar amount. Spoke with pt, copay is unaffordable. Will increase his rosuvastatin from 20mg  back to 40mg  daily. He's intolerant to ezetimibe and branded lipid meds are unaffordable with his insurance. No ongoing clinical trials that he'd qualify for unfortunately. Reviewed heart healthy diet and exercise with pt.

## 2021-07-30 ENCOUNTER — Other Ambulatory Visit: Payer: Commercial Managed Care - HMO

## 2021-08-02 ENCOUNTER — Telehealth: Payer: Self-pay | Admitting: Interventional Cardiology

## 2021-08-02 MED ORDER — REPATHA SURECLICK 140 MG/ML ~~LOC~~ SOAJ: 1.0000 | SUBCUTANEOUS | 11 refills | Status: DC

## 2021-08-02 NOTE — Telephone Encounter (Signed)
Patient got a letter on Friday that said his insurance approved his Repatha injectable for 1 year.  He wanted to know if there was a a way that the Pharmacist could show him how to use that injectable.   Please advise

## 2021-08-02 NOTE — Telephone Encounter (Signed)
Spoke with patient. I have sent Rx to pharmacy. Advised patient get copay card from [email or web address]. Once he gets the medication he will call and we will set up a time for him to come by for [place] to how him how to use it.

## 2021-08-03 NOTE — Telephone Encounter (Addendum)
Pt is not starting Repatha due to cost - see 7/17 phone note. Had already discussed this with pt, called him again to let him know. He thought med would be cheaper now since he got an approval letter from his insurance. Advised him we received the approval last week and that even with his insurance approval and copay card, Repatha is still almost $300 a month.

## 2021-08-23 ENCOUNTER — Ambulatory Visit: Payer: 59 | Admitting: Family Medicine

## 2021-09-15 ENCOUNTER — Other Ambulatory Visit: Payer: Self-pay

## 2021-09-15 ENCOUNTER — Other Ambulatory Visit: Payer: Self-pay | Admitting: Interventional Cardiology

## 2021-09-15 MED ORDER — VARENICLINE TARTRATE 1 MG PO TABS: 1.0000 mg | ORAL_TABLET | Freq: Two times a day (BID) | ORAL | 2 refills | Status: DC

## 2021-09-15 NOTE — Telephone Encounter (Signed)
Pt called [facility] I resent pt's medications to requested. Pharmacy, confirmation received

## 2021-11-25 ENCOUNTER — Other Ambulatory Visit: Payer: Self-pay | Admitting: Interventional Cardiology

## 2021-11-25 ENCOUNTER — Other Ambulatory Visit: Payer: Self-pay | Admitting: Family Medicine

## 2021-11-25 DIAGNOSIS — I10 Essential (primary) hypertension: Secondary | ICD-10-CM

## 2021-11-25 DIAGNOSIS — I110 Hypertensive heart disease with heart failure: Secondary | ICD-10-CM

## 2021-11-25 NOTE — Telephone Encounter (Signed)
Pt's pharmacy is requesting a refill on tadalafil. Would Dr. Varanasi like to refill this medication? Please address °

## 2022-01-18 ENCOUNTER — Other Ambulatory Visit: Payer: Self-pay | Admitting: Interventional Cardiology

## 2022-01-18 NOTE — Telephone Encounter (Signed)
Pt's pharmacy is requesting a refill on varenicline. Would Dr. Irish Lack like to refill this medication? Please address

## 2022-01-20 NOTE — Telephone Encounter (Signed)
Patient due for follow up in office.  Call placed to patient but message received call could not be completed.  Will send one refill to pharmacy with note indicating patient needs office visit scheduled for additional refills.

## 2022-02-06 ENCOUNTER — Other Ambulatory Visit: Payer: Self-pay | Admitting: Interventional Cardiology

## 2022-02-07 NOTE — Telephone Encounter (Signed)
Did you want to continue prescribing chantix for this pt?  Please advise. Thanks

## 2022-02-08 ENCOUNTER — Ambulatory Visit: Payer: Self-pay | Attending: Family Medicine | Admitting: Family Medicine

## 2022-02-22 NOTE — Telephone Encounter (Signed)
Error

## 2022-03-23 ENCOUNTER — Telehealth: Payer: Self-pay | Admitting: Interventional Cardiology

## 2022-03-23 ENCOUNTER — Other Ambulatory Visit: Payer: Self-pay | Admitting: Family Medicine

## 2022-03-23 DIAGNOSIS — I10 Essential (primary) hypertension: Secondary | ICD-10-CM

## 2022-03-23 DIAGNOSIS — I110 Hypertensive heart disease with heart failure: Secondary | ICD-10-CM

## 2022-03-23 NOTE — Telephone Encounter (Signed)
Pt c/o medication issue:  1. Name of Medication: Varenicline, Rosuvastatin and Clopidogrel  2. How are you currently taking this medication (dosage and times per day)?   3. Are you having a reaction (difficulty breathing--STAT)?   4. What is your medication issue? Patient said he can not afford these medicine. He wants to know what Iis his other option

## 2022-03-23 NOTE — Telephone Encounter (Signed)
Left voicemail for patient to return call to office. 

## 2022-03-23 NOTE — Telephone Encounter (Signed)
Patient called and stated he was wondering why his medication cost went up and now he can't afford to buy his Varenicline, Rosuvastatin and his Clopidrel. He stated he switched insurance. I asked him did he give pharmacy his new insurance information and he realized he has not. He will give [place] a call back once he give his pharmacy his new insurance information.

## 2022-03-23 NOTE — Telephone Encounter (Signed)
Patient is returning call. Transferred to Dem[place], LPN.  

## 2022-03-24 NOTE — Telephone Encounter (Signed)
Requested medication (s) are due for refill today: yes  Requested medication (s) are on the active medication list: yes  Last refill:  11/25/21  Future visit scheduled: no  Notes to clinic:  Unable to refill per protocol, courtesy refill already given, routing for provider approval.      Requested Prescriptions  Pending Prescriptions Disp Refills   lisinopril (ZESTRIL) 20 MG tablet [Pharmacy Med Name: LISINOPRIL 20 MG TABLET] 30 tablet     Sig: TAKE 1 TABLET BY MOUTH EVERY DAY     Cardiovascular:  ACE Inhibitors Failed - 03/23/2022 10:22 AM      Failed - Cr in normal range and within 180 days    Creat  Date Value Ref Range Status  12/07/2015 0.91 0.60 - 1.35 mg/dL Final   Creatinine, Ser  Date Value Ref Range Status  02/11/2021 0.76 0.76 - 1.27 mg/dL Final         Failed - K in normal range and within 180 days    Potassium  Date Value Ref Range Status  02/11/2021 4.1 3.5 - 5.2 mmol/L Final         Failed - Valid encounter within last 6 months    Recent Outpatient Visits           1 year ago Hypertensive heart disease with chronic combined systolic and diastolic congestive heart failure ([place])   [place], Charlane Ferretti, MD   2 years ago Hypertensive heart disease with chronic combined systolic and diastolic congestive heart failure [facility])   [place], Jarome Matin, RPH-CPP   2 years ago Need for hepatitis C screening test   [place] Rakes, MD   2 years ago Cardiomyopathy, ischemic   [place], Charlane Ferretti, MD   2 years ago Chronic right-sided low back pain with right-sided sciatica   [place], [place], MD       Future Appointments             In 1 month Barbarann Ehlers, Junius Creamer., NP [place] [facility] [place]  - Patient is not pregnant      Passed - Last BP in normal range    BP Readings from Last 1 Encounters:  01/22/21 132/82

## 2022-03-25 ENCOUNTER — Telehealth: Payer: Self-pay | Admitting: Interventional Cardiology

## 2022-03-25 DIAGNOSIS — I255 Ischemic cardiomyopathy: Secondary | ICD-10-CM

## 2022-03-25 MED ORDER — ROSUVASTATIN CALCIUM 40 MG PO TABS: 40.0000 mg | ORAL_TABLET | Freq: Every day | ORAL | 0 refills | Status: DC

## 2022-03-25 MED ORDER — CLOPIDOGREL BISULFATE 75 MG PO TABS: 75.0000 mg | ORAL_TABLET | Freq: Every day | ORAL | 0 refills | Status: DC

## 2022-03-25 MED ORDER — VARENICLINE TARTRATE 1 MG PO TABS: 1.0000 mg | ORAL_TABLET | Freq: Two times a day (BID) | ORAL | 0 refills | Status: DC

## 2022-03-25 NOTE — Telephone Encounter (Signed)
Error

## 2022-03-25 NOTE — Telephone Encounter (Signed)
*  STAT* If patient is at the pharmacy, call can be transferred to refill team.   1. Which medications need to be refilled? (please list name of each medication and dose if known) clopidogrel (PLAVIX) 75 MG tablet   rosuvastatin (CRESTOR) 40 MG tablet   varenicline (CHANTIX) 1 MG tablet   2. Which pharmacy/location (including street and city if local pharmacy) is medication to be sent to? [place] ([place]), [place] - [address]   3. Do they need a 30 day or 90 day supply? [place]

## 2022-03-30 ENCOUNTER — Other Ambulatory Visit: Payer: Self-pay | Admitting: Interventional Cardiology

## 2022-03-30 ENCOUNTER — Other Ambulatory Visit: Payer: Self-pay | Admitting: Family Medicine

## 2022-03-30 DIAGNOSIS — I10 Essential (primary) hypertension: Secondary | ICD-10-CM

## 2022-03-30 DIAGNOSIS — I110 Hypertensive heart disease with heart failure: Secondary | ICD-10-CM

## 2022-03-30 DIAGNOSIS — I255 Ischemic cardiomyopathy: Secondary | ICD-10-CM

## 2022-03-30 DIAGNOSIS — I5042 Chronic combined systolic (congestive) and diastolic (congestive) heart failure: Secondary | ICD-10-CM

## 2022-03-30 NOTE — Telephone Encounter (Signed)
*  STAT* If patient is at the pharmacy, call can be transferred to refill team.   1. Which medications need to be refilled? (please list name of each medication and dose if known)   lisinopril (ZESTRIL) 20 MG tablet    rosuvastatin (CRESTOR) 40 MG tablet    clopidogrel (PLAVIX) 75 MG tablet varenicline (CHANTIX) 1 MG tablet    2. Which pharmacy/location (including street and city if local pharmacy) is medication to be sent to?[place] ([place]), [place] - [address]   3. Do they need a 30 day or 90 day supply? 33 Day Supply  Pt is currently out of these medications

## 2022-03-31 MED ORDER — ROSUVASTATIN CALCIUM 40 MG PO TABS: 40.0000 mg | ORAL_TABLET | Freq: Every day | ORAL | 0 refills | Status: DC

## 2022-03-31 MED ORDER — LISINOPRIL 20 MG PO TABS: 20.0000 mg | ORAL_TABLET | Freq: Every day | ORAL | 0 refills | Status: DC

## 2022-03-31 MED ORDER — CLOPIDOGREL BISULFATE 75 MG PO TABS: 75.0000 mg | ORAL_TABLET | Freq: Every day | ORAL | 0 refills | Status: DC

## 2022-03-31 MED ORDER — VARENICLINE TARTRATE 1 MG PO TABS: 1.0000 mg | ORAL_TABLET | Freq: Two times a day (BID) | ORAL | 2 refills | Status: DC

## 2022-03-31 NOTE — Telephone Encounter (Signed)
Refill sent to pharmacy.   

## 2022-03-31 NOTE — Telephone Encounter (Signed)
Pt will run out of medication before appt time.; would Dr. Irish Lack like to refill his chantix? Please address

## 2022-03-31 NOTE — Telephone Encounter (Signed)
Pt is requesting a refill on varenicline Chantix. Would Dr. Irish Lack like to refill this medication? Please address

## 2022-04-06 ENCOUNTER — Telehealth: Payer: Self-pay | Admitting: Interventional Cardiology

## 2022-04-06 NOTE — Telephone Encounter (Signed)
I spoke with patient. He has changed insurance recently.  He was not going to be able [facility] so [facility].  He has not checked with [facility] yet to see how much these medicines cost.  He will contact [facility] to see what his cost will be and let us know if he is unable to afford the medications.

## 2022-04-06 NOTE — Telephone Encounter (Signed)
Left message to call office

## 2022-04-06 NOTE — Telephone Encounter (Signed)
Pt c/o medication issue:  1. Name of Medication:    carvedilol (COREG) 12.5 MG tablet Take 1 tablet (12.5 mg total) by mouth 2 (two) times daily.   clopidogrel (PLAVIX) 75 MG tablet Take 1 tablet (75 mg total) by mouth daily.   rosuvastatin (CRESTOR) 40 MG tablet Take 1 tablet (40 mg total) by mouth daily.    varenicline (CHANTIX) 1 MG tablet Take 1 tablet (1 mg total) by mouth 2 (two) times daily.    2. How are you currently taking this medication (dosage and times per day)?   3. Are you having a reaction (difficulty breathing--STAT)?   4. What is your medication issue? Pt is calling back about his cardiac medications. He states he is about to run out and he does not have insurance right now. Prescriptions were sent to [facility] last week and he said someone here told him they would $4 a piece there. [facility] told him these medication will cost $60 and he is unable to afford them. Pt asking what he should do. Please advise.

## 2022-05-03 NOTE — Progress Notes (Unsigned)
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   On Coumadin   NSTEMI (non-ST elevated myocardial infarction) (HCC) 03/26/2014   Patient pain increased and he developed ECG changes, taken to the cath lab as a STEMI, PCI to the LAD   Past Surgical History:  Procedure Laterality Date   HERNIA REPAIR     LEFT HEART CATHETERIZATION WITH CORONARY ANGIOGRAM N/A 03/27/2014   Procedure: LEFT HEART CATHETERIZATION WITH CORONARY ANGIOGRAM;  Surgeon: Corky Crafts, MD; L main okay, LAD 100%, CFX okay, RI moderate disease, OM1 mild disease, RCA moderate disease, EF 30%   PERCUTANEOUS CORONARY STENT INTERVENTION (PCI-S)  03/27/2014   3.0 x 32 Synergy overlapped with 2.5 x 38 Synergy to the mLAD    Allergies  Allergies  Allergen Reactions   Zetia [Ezetimibe]     Joint pain     History of Present Illness    Thomas Woods  is a 57 year old male with a PMH of CAD s/p anterior wall MI 03/2014 treated with 2 DES to occluded mLAD overlapping, medical therapy for moderate RCA disease, ICM (25-30%) with LifeVest now with normal EF, apical thrombus treated with Coumadin, HLD, HTN, lung nodules who presents today for annual follow-up of coronary artery disease.  He was last seen by Dr. Eldridge Dace on 01/22/2021 for follow-up visit.  He reported doing well with no new cardiac complaints at that time.  He is on Plavix due to long stent in LAD and was taking Wellbutrin to help with smoking cessation.   2D echo was completed in 2016 with EF of 50-55%.  He reported myalgias and had rosuvastatin held and was referred to the clinical pharmacist for evaluation and consideration for PCSK9 inhibitor.  Since last being seen in the office patient reports***.  Patient denies chest pain, palpitations, dyspnea, PND, orthopnea, nausea, vomiting, dizziness, syncope, edema, weight gain, or early satiety.     ***Notes:  Home Medications    Current Outpatient Medications  Medication Sig Dispense Refill   buPROPion (WELLBUTRIN XL) 150 MG 24 hr tablet TAKE 1 TABLET(150 MG) BY MOUTH DAILY 90 tablet 0   busPIRone (BUSPAR) 10 MG tablet TAKE 1 TABLET(10 MG) BY MOUTH TWICE DAILY 180 tablet 0   carvedilol (COREG) 12.5 MG tablet Take 1 tablet (12.5 mg total) by mouth 2 (two) times daily. 180 tablet 1   clopidogrel (PLAVIX) 75 MG tablet Take 1 tablet (75 mg total) by mouth daily. 90 tablet 0   gabapentin (NEURONTIN) 300 MG capsule TAKE 1 CAPSULE BY MOUTH TWICE DAILY 180 capsule 0   lisinopril (ZESTRIL) 20 MG tablet Take 1 tablet (20 mg total) by mouth daily. 90 tablet 0   nitroGLYCERIN (NITROSTAT) 0.4 MG SL tablet Place 1 tablet (0.4 mg total) under the tongue every 5 (five) minutes as needed for chest pain (x 3 doses). 25 tablet 3   rosuvastatin (CRESTOR) 40 MG tablet Take 1 tablet (40 mg total) by  mouth daily. 90 tablet 0   tadalafil (CIALIS) 10 MG tablet TAKE 1 TABLET BY MOUTH DAILY AS NEEDED FOR ERECTILE DYSFUNCTION 8 tablet 3   traMADol (ULTRAM) 50 MG tablet Take 1 tablet (50 mg total) by mouth every 12 (twelve) hours as needed. 40 tablet 1   varenicline (CHANTIX) 1 MG tablet Take 1 tablet (1 mg total) by mouth 2 (two) times daily. 60 tablet 2   No current facility-administered medications for this visit.     Review of Systems  Please see the history of present illness.    (+)*** (+)***  All other systems reviewed and are otherwise negative except as noted above.  Physical Exam    Wt Readings from  Last 3 Encounters:  01/22/21 215 lb (97.5 kg)  10/19/20 213 lb (96.6 kg)  02/12/20 217 lb 6.4 oz (98.6 kg)   WU:JWJXB were no vitals filed for this visit.,There is no height or weight on file to calculate BMI.  Constitutional:      Appearance: Healthy appearance. Not in distress.  Neck:     Vascular: JVD normal.  Pulmonary:     Effort: Pulmonary effort is normal.     Breath sounds: No wheezing. No rales. Diminished in the bases Cardiovascular:     Normal rate. Regular rhythm. Normal S1. Normal S2.      Murmurs: There is no murmur.  Edema:    Peripheral edema absent.  Abdominal:     Palpations: Abdomen is soft non tender. There is no hepatomegaly.  Skin:    General: Skin is warm and dry.  Neurological:     General: No focal deficit present.     Mental Status: Alert and oriented to person, place and time.     Cranial Nerves: Cranial nerves are intact.  EKG/LABS/ Recent Cardiac Studies    ECG personally reviewed by me today - ***  Cardiac Studies & Procedures       ECHOCARDIOGRAM  ECHOCARDIOGRAM COMPLETE 07/07/2014  Narrative *[facility]* [address] [place], [place] [address] [phone]  ------------------------------------------------------------------- Transthoracic Echocardiography  Patient:    Thomas Woods MR #:       [id number] Study Date: 07/07/2014 Gender:     M Age:        49 Height:     185.4 cm Weight:     99.8 kg BSA:        2.29 m^2 Pt. Status: Room:  SONOGRAPHER  Aida Raider, RDCS ATTENDING    Corky Crafts ORDERING     [employer], Renelda Loma S REFERRING    [facility], [place] S PERFORMING   [facility], Outpatient  cc:  ------------------------------------------------------------------- LV EF: 50% -   55%  ------------------------------------------------------------------- Indications:      R06.02 Shortness of Breath.  ------------------------------------------------------------------- History:   PMH:  Acquired from the  patient and from the patient&'s chart.  PMH:  NSTEMI. Ischemic Cardiomyopathy. CAD. History of mural thrombus.  ------------------------------------------------------------------- Study Conclusions  - Left ventricle: The cavity size was normal. Wall thickness was normal. Systolic function was at the lower limits of normal. The estimated ejection fraction was in the range of 50% to 55%. Hypokinesis of the apical myocardium. Doppler parameters are consistent with abnormal left ventricular relaxation (grade 1 diastolic dysfunction). No evidence of thrombus. - Aortic valve: There was trivial regurgitation.  Transthoracic echocardiography.  M-mode, complete 2D, spectral Doppler, and color Doppler.  Birthdate:  Patient birthdate: 07/05/1965.  Age:  Patient is 57 yr old.  Sex:  Gender: male.  BMI: 29 kg/m^2.  Blood pressure:     115/82  Patient status: Outpatient.  Study date:  Study date: 07/07/2014. Study time: 01:09 PM.  Location:  [facility]  -------------------------------------------------------------------  ------------------------------------------------------------------- Left ventricle:  The cavity size was normal. Wall thickness was normal. Systolic function was at the lower limits of normal. The estimated ejection fraction was in the range of 50% to 55%.  No evidence of thrombus.  Regional wall motion abnormalities: Hypokinesis of the apical myocardium. Doppler parameters are consistent with abnormal left ventricular relaxation (grade 1 diastolic dysfunction).  ------------------------------------------------------------------- Aortic valve:   Structurally normal valve.   Cusp separation was normal.  Doppler:  Transvalvular velocity was within the normal range. There was no stenosis. There was trivial regurgitation.  ------------------------------------------------------------------- Mitral valve:   Structurally normal valve.   Leaflet separation was normal.   Doppler:  Transvalvular velocity was within the normal range. There was no evidence for stenosis. There was trivial regurgitation.  ------------------------------------------------------------------- Left atrium:  The atrium was normal in size.  ------------------------------------------------------------------- Right ventricle:  The cavity size was normal. Wall thickness was normal. Systolic function was normal.  ------------------------------------------------------------------- Pulmonic valve:    Structurally normal valve.   Cusp separation was normal.  Doppler:  Transvalvular velocity was within the normal range. There was trivial regurgitation.  ------------------------------------------------------------------- Tricuspid valve:   Doppler:  There was trivial regurgitation.  ------------------------------------------------------------------- Right atrium:  The atrium was normal in size.  ------------------------------------------------------------------- Pericardium:  There was no pericardial effusion.  ------------------------------------------------------------------- Systemic veins: Inferior vena cava: The vessel was normal in size. The respirophasic diameter changes were in the normal range (>= 50%), consistent with normal central venous pressure.  ------------------------------------------------------------------- Measurements  Left ventricle                           Value        Reference LV ID, ED, PLAX chordal                  49    mm     43 - 52 LV ID, ES, PLAX chordal                  36    mm     23 - 38 LV fx shortening, PLAX chordal   (L)     27    %      >=29 LV PW thickness, ED                      10    mm     --------- IVS/LV PW ratio, ED                      1            <=1.3 Stroke volume, 2D                        77    ml     --------- Stroke volume/bsa, 2D                    34    ml/m^2 --------- LV e&', lateral                           9.57   cm/s   --------- LV E/e&', lateral  6.12         --------- LV e&', medial                            6.96  cm/s   --------- LV E/e&', medial                          8.42         --------- LV e&', average                           8.27  cm/s   --------- LV E/e&', average                         7.09         ---------  Ventricular septum                       Value        Reference IVS thickness, ED                        10    mm     ---------  LVOT                                     Value        Reference LVOT ID, S                               23    mm     --------- LVOT area                                4.15  cm^2   --------- LVOT mean velocity, S                    71.3  cm/s   --------- LVOT VTI, S                              18.5  cm     ---------  Aorta                                    Value        Reference Aortic root ID, ED                       38    mm     --------- Ascending aorta ID, A-P, mid, ED (H)     40.16 mm     21 - 34  Left atrium                              Value        Reference LA ID, A-P, ES                           35  mm     --------- LA ID/bsa, A-P                           1.53  cm/m^2 <=2.2 LA volume, S                             41.9  ml     --------- LA volume/bsa, S                         18.3  ml/m^2 --------- LA volume, ES, 1-p A4C                   34.5  ml     --------- LA volume/bsa, ES, 1-p A4C               15.1  ml/m^2 --------- LA volume, ES, 1-p A2C                   49.8  ml     --------- LA volume/bsa, ES, 1-p A2C               21.8  ml/m^2 ---------  Mitral valve                             Value        Reference Mitral E-wave peak velocity              58.6  cm/s   --------- Mitral A-wave peak velocity              69.6  cm/s   --------- Mitral deceleration time         (H)     331   ms     150 - 230 Mitral E/A ratio, peak                   0.84         ---------  Right ventricle                           Value        Reference RV s&', lateral, S                        15.7  cm/s   ---------  Legend: (L)  and  (H)  mark values outside specified reference range.  ------------------------------------------------------------------- Prepared and Electronically Authenticated by  Thurmon Fair, MD 2016-06-27T17:58:51             Risk Assessment/Calculations:   {Does this patient have ATRIAL FIBRILLATION?[phone]}        Lab Results  Component Value Date   WBC 4.4 02/11/2021   HGB 14.7 02/11/2021   HCT 43.6 02/11/2021   MCV 95 02/11/2021   PLT 182 02/11/2021   Lab Results  Component Value Date   CREATININE 0.76 02/11/2021   BUN 9 02/11/2021   NA 144 02/11/2021   K 4.1 02/11/2021   CL 104 02/11/2021   CO2 26 02/11/2021   Lab Results  Component Value Date   ALT 34 07/23/2021   AST 35 07/23/2021   ALKPHOS 50 07/23/2021   BILITOT 1.3 (H) 07/23/2021   Lab Results  Component Value Date   CHOL 170 07/23/2021  HDL 78 07/23/2021   LDLCALC 82 07/23/2021   TRIG 50 07/23/2021   CHOLHDL 2.2 07/23/2021    No results found for: "HGBA1C"   Assessment & Plan    1.  Coronary artery disease: -s/p anterior MI 03/2014 with DES x 2 to mLAD with 2 overlapping stents. -Today patient reports*** -Continue GDMT with***   2.  Ischemic cardiomyopathy: -Following anterior MI 03/2014 patient's EF reduced to 30% and was discharged with LifeVest.  2D echo was repeated and 2016 with improved EF of 50-55% -Today patient reports***  3.  Essential hypertension: -Patient's blood pressure today was*** -Continue***  4.  Hyperlipidemia: -Patient is LDL cholesterol was*** -Continue***  5.  History of tobacco abuse: -Patient currently on Chantix and Wellbutrin for smoking cessation      Disposition: Follow-up with Lance Muss, MD or APP in *** months {Are you ordering a CV Procedure (e.g. stress test, cath, DCCV, TEE, etc)?   Press F2        :[id number]}   Medication  Adjustments/Labs and Tests Ordered: Current medicines are reviewed at length with the patient today.  Concerns regarding medicines are outlined above.   Signed, Napoleon Form, Leodis Rains, NP 05/03/2022, 6:01 PM [facility]

## 2022-05-04 ENCOUNTER — Encounter: Payer: Self-pay | Admitting: Nurse Practitioner

## 2022-05-04 ENCOUNTER — Telehealth: Payer: Self-pay | Admitting: Nurse Practitioner

## 2022-05-04 ENCOUNTER — Other Ambulatory Visit: Payer: Self-pay

## 2022-05-04 ENCOUNTER — Ambulatory Visit: Payer: BLUE CROSS/BLUE SHIELD | Attending: Nurse Practitioner | Admitting: Nurse Practitioner

## 2022-05-04 VITALS — BP 128/80 | HR 73 | Ht 72.0 in | Wt 193.0 lb

## 2022-05-04 DIAGNOSIS — I10 Essential (primary) hypertension: Secondary | ICD-10-CM

## 2022-05-04 DIAGNOSIS — E782 Mixed hyperlipidemia: Secondary | ICD-10-CM

## 2022-05-04 DIAGNOSIS — I255 Ischemic cardiomyopathy: Secondary | ICD-10-CM

## 2022-05-04 DIAGNOSIS — I25119 Atherosclerotic heart disease of native coronary artery with unspecified angina pectoris: Secondary | ICD-10-CM

## 2022-05-04 DIAGNOSIS — Z720 Tobacco use: Secondary | ICD-10-CM | POA: Diagnosis not present

## 2022-05-04 DIAGNOSIS — F4323 Adjustment disorder with mixed anxiety and depressed mood: Secondary | ICD-10-CM

## 2022-05-04 MED ORDER — BUPROPION HCL ER (XL) 150 MG PO TB24: 150.0000 mg | ORAL_TABLET | Freq: Every day | ORAL | 3 refills | Status: DC

## 2022-05-04 MED ORDER — TADALAFIL 10 MG PO TABS: ORAL_TABLET | ORAL | 3 refills | Status: DC

## 2022-05-04 MED ORDER — CARVEDILOL 12.5 MG PO TABS: 12.5000 mg | ORAL_TABLET | Freq: Two times a day (BID) | ORAL | 1 refills | Status: DC

## 2022-05-04 MED ORDER — CLOPIDOGREL BISULFATE 75 MG PO TABS: 75.0000 mg | ORAL_TABLET | Freq: Every day | ORAL | 10 refills | Status: DC

## 2022-05-04 MED ORDER — BUSPIRONE HCL 10 MG PO TABS: ORAL_TABLET | ORAL | 11 refills | Status: DC

## 2022-05-04 MED ORDER — NITROGLYCERIN 0.4 MG SL SUBL: 0.4000 mg | SUBLINGUAL_TABLET | SUBLINGUAL | 3 refills | Status: DC | PRN

## 2022-05-04 NOTE — Telephone Encounter (Signed)
Pt c/o medication issue:  1. Name of Medication:   nitroGLYCERIN (NITROSTAT) 0.4 MG SL tablet   tadalafil (CIALIS) 10 MG tablet  2. How are you currently taking this medication (dosage and times per day)?   3. Are you having a reaction (difficulty breathing--STAT)? No   4. What is your medication issue? [facility] pharmacist called in stating these two medication have a life threatening drug interaction. She wanted to make sure Alden Server, NP is aware to have someone clinical c/b for documentation.

## 2022-05-04 NOTE — Telephone Encounter (Signed)
Patient returned RN's call as his phone got disconnected.

## 2022-05-04 NOTE — Telephone Encounter (Signed)
Call went directly to voicemail, left message for patient informing him it does not appear anyone else from our office has tried to contact him today.  Previous discussion with triage nurse One[facility] completed without disconnection.  Informed patient if he knows who called him and is needing to return a call to callback.

## 2022-05-04 NOTE — Patient Instructions (Addendum)
Medication Instructions:  START Wellbutrin  Take 1 tablet once a day  *If you need a refill on your cardiac medications before your next appointment, please call your pharmacy* [facility]   Lab Work: TODAY-BMET, CBC, LFT, LIPID If you have labs (blood work) drawn today and your tests are completely normal, you will receive your results only by: MyChart Message (if you have MyChart) OR A paper copy in the mail If you have any lab test that is abnormal or we need to change your treatment, we will call you to review the results.   Testing/Procedures: NONE ORDERED   Follow-Up: At [facility], you and your health needs are our priority.  As part of our continuing mission to provide you with exceptional heart care, we have created designated Provider Care Teams.  These Care Teams include your primary Cardiologist (physician) and Advanced Practice Providers (APPs -  Physician Assistants and Nurse Practitioners) who all work together to provide you with the care you need, when you need it.  We recommend signing up for the patient portal called "MyChart".  Sign up information is provided on this After Visit Summary.  MyChart is used to connect with patients for Virtual Visits (Telemedicine).  Patients are able to view lab/test results, encounter notes, upcoming appointments, etc.  Non-urgent messages can be sent to your provider as well.   To learn more about what you can do with MyChart, go to [email or web address].    Your next appointment:   12 month(s)  Provider:   Lance Muss, MD     Other Instructions

## 2022-05-04 NOTE — Telephone Encounter (Signed)
Spoke to the patient explained Alden Server ,NP recommendations:  Please discontinue nitrostat and advise patient.   Patient voiced understanding.

## 2022-05-05 LAB — HEPATIC FUNCTION PANEL
ALT: 37 IU/L (ref 0–44)
AST: 43 IU/L — ABNORMAL HIGH (ref 0–40)
Albumin: 4.8 g/dL (ref 3.8–4.9)
Alkaline Phosphatase: 56 IU/L (ref 44–121)
Bilirubin Total: 0.8 mg/dL (ref 0.0–1.2)
Bilirubin, Direct: 0.19 mg/dL (ref 0.00–0.40)
Total Protein: 7.7 g/dL (ref 6.0–8.5)

## 2022-05-05 LAB — LIPID PANEL
Chol/HDL Ratio: 2.2 ratio (ref 0.0–5.0)
Cholesterol, Total: 177 mg/dL (ref 100–199)
HDL: 80 mg/dL (ref >39)
LDL Chol Calc (NIH): 86 mg/dL (ref 0–99)
Triglycerides: 56 mg/dL (ref 0–149)
VLDL Cholesterol Cal: 11 mg/dL (ref 5–40)

## 2022-05-05 LAB — CBC
Hematocrit: 42.6 % (ref 37.5–51.0)
Hemoglobin: 14.5 g/dL (ref 13.0–17.7)
MCH: 32.3 pg (ref 26.6–33.0)
MCHC: 34 g/dL (ref 31.5–35.7)
MCV: 95 fL (ref 79–97)
Platelets: 181 10*3/uL (ref 150–450)
RBC: 4.49 x10E6/uL (ref 4.14–5.80)
RDW: 13 % (ref 11.6–15.4)
WBC: 4.4 10*3/uL (ref 3.4–10.8)

## 2022-05-05 LAB — BASIC METABOLIC PANEL
BUN/Creatinine Ratio: 15 (ref 9–20)
BUN: 12 mg/dL (ref 6–24)
CO2: 23 mmol/L (ref 20–29)
Calcium: 9.8 mg/dL (ref 8.7–10.2)
Chloride: 101 mmol/L (ref 96–106)
Creatinine, Ser: 0.79 mg/dL (ref 0.76–1.27)
Glucose: 84 mg/dL (ref 70–99)
Potassium: 4.2 mmol/L (ref 3.5–5.2)
Sodium: 139 mmol/L (ref 134–144)
eGFR: 104 mL/min/{1.73_m2} (ref >59)

## 2022-05-31 ENCOUNTER — Other Ambulatory Visit: Payer: Self-pay | Admitting: Interventional Cardiology

## 2022-06-07 ENCOUNTER — Other Ambulatory Visit (HOSPITAL_COMMUNITY): Payer: Self-pay

## 2022-06-13 ENCOUNTER — Other Ambulatory Visit: Payer: Self-pay

## 2022-06-13 DIAGNOSIS — I10 Essential (primary) hypertension: Secondary | ICD-10-CM

## 2022-06-13 DIAGNOSIS — I110 Hypertensive heart disease with heart failure: Secondary | ICD-10-CM

## 2022-06-13 MED ORDER — LISINOPRIL 20 MG PO TABS
20.0000 mg | ORAL_TABLET | Freq: Every day | ORAL | 3 refills | Status: DC
Start: 2022-06-13 — End: 2023-06-08

## 2022-08-22 ENCOUNTER — Ambulatory Visit
Admission: EM | Admit: 2022-08-22 | Discharge: 2022-08-22 | Disposition: A | Discharge status: Home / Self Care | Payer: BLUE CROSS/BLUE SHIELD | Attending: Physician Assistant | Admitting: Physician Assistant

## 2022-08-22 DIAGNOSIS — M5442 Lumbago with sciatica, left side: Secondary | ICD-10-CM | POA: Diagnosis not present

## 2022-08-22 DIAGNOSIS — M5441 Lumbago with sciatica, right side: Secondary | ICD-10-CM | POA: Diagnosis not present

## 2022-08-22 DIAGNOSIS — G8929 Other chronic pain: Secondary | ICD-10-CM

## 2022-08-22 MED ORDER — GABAPENTIN 300 MG PO CAPS
300.0000 mg | ORAL_CAPSULE | Freq: Two times a day (BID) | ORAL | 0 refills | Status: DC
Start: 2022-08-22 — End: 2023-05-30

## 2022-08-22 MED ORDER — PREDNISONE 20 MG PO TABS: 40.0000 mg | ORAL_TABLET | Freq: Every day | ORAL | 0 refills | Status: AC

## 2022-08-22 NOTE — ED Triage Notes (Signed)
"  I have 2 herniated disks, I was going to PT and this was working for a bit, I couldn't afford to keep going". My pcp recommended Urgent Care due to need for "note and Rx, Gabapentin".

## 2022-08-23 NOTE — ED Provider Notes (Signed)
[facility]    CSN: [id number] Arrival date & time: 08/22/22  1318      History   Chief Complaint Chief Complaint  Patient presents with   Back Pain    HPI Thomas Woods is a 57 y.o. male.   Patient here today for evaluation of back pain.  Pain is acute on chronic as he is aware of 2 herniated disc.  He states he was going to PT but states he could not afford to continue to go.  He states PT was helping significantly.  He notes that he was advised to have surgery but he does not want to pursue this at this time.  He states he has used gabapentin in the past with relief and would like same if possible.  He denies any numbness or tingling.  Pain does radiate down both legs at times.  He denies any loss of bowel or bladder function.  He has not had any new injury.  The history is provided by the patient.  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   On Coumadin   NSTEMI (non-ST elevated myocardial infarction) (HCC) 03/26/2014   Patient pain increased and he developed ECG changes, taken to the cath lab as a STEMI, PCI to the LAD    Patient Active Problem List   Diagnosis Date Noted   Smoking addiction 08/03/2016   Adjustment disorder with mixed anxiety and depressed mood 08/03/2016   Essential hypertension 08/24/2015   Old MI (myocardial infarction) 08/24/2015   CAD (coronary artery disease) 02/23/2015   Lung nodule 10/20/2014   Cardiomyopathy, ischemic 10/20/2014   Erectile dysfunction 05/29/2014   Hyperlipidemia 05/29/2014   Follow up 05/13/2014   Cigarette nicotine dependence with withdrawal 05/13/2014    Encounter for therapeutic drug monitoring 04/04/2014   LV (left ventricular) mural thrombus 03/29/2014   Tobacco abuse 03/27/2014   Acute anterior wall MI (HCC) 03/27/2014   NSTEMI (non-ST elevated myocardial infarction) (HCC) 03/27/2014    Past Surgical History:  Procedure Laterality Date   HERNIA REPAIR     LEFT HEART CATHETERIZATION WITH CORONARY ANGIOGRAM N/A 03/27/2014   Procedure: LEFT HEART CATHETERIZATION WITH CORONARY ANGIOGRAM;  Surgeon: Corky Crafts, MD; L main okay, LAD 100%, CFX okay, RI moderate disease, OM1 mild disease, RCA moderate disease, EF 30%   PERCUTANEOUS CORONARY STENT INTERVENTION (PCI-S)  03/27/2014   3.0 x 32 Synergy overlapped with 2.5 x 38 Synergy to the mLAD       Home Medications    Prior to Admission medications   Medication Sig Start Date End Date Taking? Authorizing Provider  buPROPion (WELLBUTRIN XL) 150 MG 24 hr tablet Take 1 tablet (150 mg total) by mouth daily. 05/04/22  Yes Gaston Islam., NP  busPIRone (BUSPAR) 10 MG tablet TAKE 1 TABLET(10 MG) BY MOUTH TWICE DAILY 05/04/22  Yes Gaston Islam., NP  carvedilol (COREG) 12.5 MG tablet Take 1 tablet (12.5 mg total) by mouth 2 (two) times daily. 05/04/22  Yes Gaston Islam., NP  clopidogrel (PLAVIX) 75 MG tablet TAKE 1  TABLET BY MOUTH EVERY DAY 05/31/22  Yes Gaston Islam., NP  predniSONE (DELTASONE) 20 MG tablet Take 2 tablets (40 mg total) by mouth daily with breakfast for 5 days. 08/22/22 08/27/22 Yes Tomi Bamberger, PA-C  rosuvastatin (CRESTOR) 40 MG tablet Take 1 tablet (40 mg total) by mouth daily. 03/31/22  Yes Corky Crafts, MD  gabapentin (NEURONTIN) 300 MG capsule Take 1 capsule (300 mg total) by mouth 2 (two) times daily. 08/22/22   Tomi Bamberger, PA-C  lisinopril (ZESTRIL) 20 MG tablet Take 1 tablet (20 mg total) by mouth daily. 06/13/22   Gaston Islam., NP  tadalafil (CIALIS) 10 MG tablet TAKE 1 TABLET BY MOUTH DAILY AS NEEDED FOR ERECTILE DYSFUNCTION 05/04/22    Gaston Islam., NP  traMADol (ULTRAM) 50 MG tablet Take 1 tablet (50 mg total) by mouth every 12 (twelve) hours as needed. 05/13/21   Hoy Register, MD  varenicline (CHANTIX) 1 MG tablet Take 1 tablet (1 mg total) by mouth 2 (two) times daily. 03/31/22   Corky Crafts, MD    Family History Family History  Problem Relation Age of Onset   Healthy Mother    Healthy Sister    Healthy Brother    Heart attack Neg Hx    Hypertension Neg Hx    Stroke Neg Hx     Social History Social History   Tobacco Use   Smoking status: Light Smoker    Current packs/day: 0.20    Types: Cigarettes   Smokeless tobacco: Never   Tobacco comments:    3 cigarettes a day   Vaping Use   Vaping status: Never Used  Substance Use Topics   Alcohol use: Yes    Alcohol/week: 0.0 standard drinks of alcohol   Drug use: No     Allergies   Zetia [ezetimibe]   Review of Systems Review of Systems  Constitutional:  Negative for chills and fever.  Eyes:  Negative for discharge and redness.  Respiratory:  Negative for shortness of breath.   Genitourinary:  Negative for dysuria and hematuria.  Musculoskeletal:  Positive for back pain.  Neurological:  Negative for numbness.     Physical Exam Triage Vital Signs ED Triage Vitals  Encounter Vitals Group     BP 08/22/22 1328 129/75     Systolic BP Percentile --      Diastolic BP Percentile --      Pulse Rate 08/22/22 1328 88     Resp 08/22/22 1328 18     Temp 08/22/22 1328 98.5 F (36.9 C)     Temp Source 08/22/22 1328 Oral     SpO2 08/22/22 1328 97 %     Weight 08/22/22 1326 210 lb (95.3 kg)     Height 08/22/22 1326 6' (1.829 m)     Head Circumference --      Peak Flow --      Pain Score 08/22/22 1325 9     Pain Loc --      Pain Education --      Exclude from Growth Chart --    No data found.  Updated Vital Signs BP 129/75 (BP Location: Left Arm)   Pulse 88   Temp 98.5 F (36.9 C) (Oral)   Resp 18   Ht 6' (1.829 m)   Wt 210  lb (95.3 kg)   SpO2 97%   BMI 28.48 kg/m       Physical Exam Vitals and nursing note  reviewed.  Constitutional:      General: He is not in acute distress.    Appearance: Normal appearance. He is not ill-appearing.  HENT:     Head: Normocephalic and atraumatic.  Eyes:     Conjunctiva/sclera: Conjunctivae normal.  Cardiovascular:     Rate and Rhythm: Normal rate.  Pulmonary:     Effort: Pulmonary effort is normal. No respiratory distress.  Musculoskeletal:     Comments: No tenderness to palpation to thoracic spine mild tenderness to palpation to lumbar spine diffusely  Neurological:     Mental Status: He is alert.  Psychiatric:        Mood and Affect: Mood normal.        Behavior: Behavior normal.        Thought Content: Thought content normal.      UC Treatments / Results  Labs (all labs ordered are listed, but only abnormal results are displayed) Labs Reviewed - No data to display  EKG   Radiology No results found.  Procedures Procedures (including critical care time)  Medications Ordered in UC Medications - No data to display  Initial Impression / Assessment and Plan / UC Course  I have reviewed the triage vital signs and the nursing notes.  Pertinent labs & imaging results that were available during my care of the patient were reviewed by me and considered in my medical decision making (see chart for details).    Will treat with gabapentin and steroid as this has helped in the past.  Encouraged follow-up with specialist should symptoms not improve or worsen in any way.  Final Clinical Impressions(s) / UC Diagnoses   Final diagnoses:  Acute bilateral low back pain with bilateral sciatica   Discharge Instructions   None    ED Prescriptions     Medication Sig Dispense Auth. Provider   predniSONE (DELTASONE) 20 MG tablet Take 2 tablets (40 mg total) by mouth daily with breakfast for 5 days. 10 tablet Erma Pinto F, PA-C   gabapentin (NEURONTIN)  300 MG capsule Take 1 capsule (300 mg total) by mouth 2 (two) times daily. 180 capsule Tomi Bamberger, PA-C      PDMP not reviewed this encounter.   Tomi Bamberger, PA-C 08/23/22 [phone]

## 2022-11-12 ENCOUNTER — Telehealth: Payer: Self-pay | Admitting: Cardiology

## 2022-11-12 ENCOUNTER — Other Ambulatory Visit: Payer: Self-pay | Admitting: Interventional Cardiology

## 2022-11-12 NOTE — Telephone Encounter (Signed)
Outpatient service line:  CC: medication refill   Received page patient wanted a refill on his rosuvastatin.  Attempted to call patient twice and left a voicemail.  Given inability to contact patient, will defer providing a refill at this time.  He has instructions to call back or to page service line again.

## 2022-12-01 ENCOUNTER — Other Ambulatory Visit: Payer: Self-pay | Admitting: Interventional Cardiology

## 2023-01-21 ENCOUNTER — Other Ambulatory Visit: Payer: Self-pay | Admitting: Interventional Cardiology

## 2023-02-01 ENCOUNTER — Telehealth: Payer: Self-pay | Admitting: Nurse Practitioner

## 2023-02-01 DIAGNOSIS — E785 Hyperlipidemia, unspecified: Secondary | ICD-10-CM

## 2023-02-01 NOTE — Telephone Encounter (Signed)
Spoke with pt and advised of NP's recommendations as below.  Pt verbalizes understanding and is agreeable to referral.

## 2023-02-01 NOTE — Telephone Encounter (Signed)
Spoke with pt who complains of increased joint pain taking Rosuvastatin.  Pt states he has had a trial of stopping the medication in which he did see improvement.  Pt would like to know if he could try another medication that might not have the same side effects.   Pt advised will forward to provider for review and further recommendation.  Pt verbalizes understanding and agrees with current plan.

## 2023-02-01 NOTE — Telephone Encounter (Signed)
Pt c/o medication issue:  1. Name of Medication:   rosuvastatin (CRESTOR) 40 MG tablet    2. How are you currently taking this medication (dosage and times per day)? As written  3. Are you having a reaction (difficulty breathing--STAT)? Joint back   4. What is your medication issue? Pt is having a lot of joint/back pain. He feels it may be coming from this medication.

## 2023-02-03 ENCOUNTER — Ambulatory Visit: Payer: BLUE CROSS/BLUE SHIELD

## 2023-02-03 NOTE — Progress Notes (Deleted)
Patient ID: Thomas Woods                 DOB: 1965/11/21                    MRN: [id number]      HPI: Abhijot Straughter is a 58 y.o. male patient referred to lipid clinic by Robin Searing, NP. PMH is significant for  CAD s/p anterior wall MI 03/2014 treated with 2 DES to occluded mLAD overlapping, medical therapy for moderate RCA disease, ICM (25-30%) with LifeVest now with normal EF, apical thrombus treated with Coumadin, HLD, HTN, lung nodules.   Patient seen previously in lipid clinic. Initially in June of 2023 his LFT were elevated on rosuvastatin 40mg , however it was deemed LFT increase was due to alcohol intake. He experienced joint aches on rosuvastatin 40mg . His dose was lowered to 20mg  daily. Attempted to get him on Repatha or Nexlizet, but was unaffordable even with a copay card. He did not tolerate zetia due to joint pain. His rosuvastatin dose was increased back to 40mg  in July 2023. Patient called the office 02/01/23 reporting increased joint pain with rosuvastatin. Said he had held the medication and his pain improved.    High deductible and then 50% for higher tiers  Reviewed options for lowering LDL cholesterol, including ezetimibe, PCSK-9 inhibitors, bempedoic acid and inclisiran.  Discussed mechanisms of action, dosing, side effects and potential decreases in LDL cholesterol.  Also reviewed cost information and potential options for patient assistance.   Current Medications:  Intolerances: zetia (joint pain) rosuvastatin 40mg  (joint pain) atorvastatin 80mg  (joint pain) Risk Factors: premature ASCVD LDL-C goal: <55 ApoB goal:   Diet:   Exercise:   Family History:   Social History:   Labs: Lipid Panel     Component Value Date/Time   CHOL 177 05/04/2022 0910   TRIG 56 05/04/2022 0910   HDL 80 05/04/2022 0910   CHOLHDL 2.2 05/04/2022 0910   CHOLHDL 2.1 12/07/2015 1053   VLDL 8 12/07/2015 1053   LDLCALC 86 05/04/2022 0910   LABVLDL 11 05/04/2022 0910    Past Medical History:   Diagnosis Date   Hyperlipidemia LDL goal <70 03/27/2014   Hypertension    Ischemic cardiomyopathy 03/27/2014   EF 30% at cath, confirmed by echo   Multiple lung nodules on CT 03/28/2014   Right lung nodules up to 6 mm, repeat CT in 6-12 months   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 30 tablet 3   busPIRone (BUSPAR) 10 MG tablet TAKE 1 TABLET(10 MG) BY MOUTH TWICE DAILY 60 tablet 11   carvedilol (COREG) 12.5 MG tablet Take 1 tablet (12.5 mg total) by mouth 2 (two) times daily. 180 tablet 1   clopidogrel (PLAVIX) 75 MG tablet TAKE 1 TABLET BY MOUTH EVERY DAY 30 tablet 10   gabapentin (NEURONTIN) 300 MG capsule Take 1 capsule (300 mg total) by mouth 2 (two) times daily. 180 capsule 0   lisinopril (ZESTRIL) 20 MG tablet Take 1 tablet (20 mg total) by mouth daily. 90 tablet 3  rosuvastatin (CRESTOR) 40 MG tablet Take 1 tablet by mouth once daily 90 tablet 3   tadalafil (CIALIS) 10 MG tablet TAKE 1 TABLET BY MOUTH DAILY AS NEEDED FOR ERECTILE DYSFUNCTION 8 tablet 3   traMADol (ULTRAM) 50 MG tablet Take 1 tablet (50 mg total) by mouth every 12 (twelve) hours as needed. 40 tablet 1   varenicline (CHANTIX) 1 MG tablet TAKE 1 TABLET BY MOUTH TWICE DAILY ***PLEASE  KEEP  APPOINTMENT  IN  APRIL  FOR  FUTURE  REFILLS*** 60 tablet 0   No current facility-administered medications on file prior to visit.    Allergies  Allergen Reactions   Zetia [Ezetimibe]     Joint pain    Assessment/Plan:  1. Hyperlipidemia -  No problem-specific Assessment & Plan notes found for this encounter.    Thank you,  Olene Floss, Pharm.D, BCACP, CPP [facility] of [facility] [address] [place], [place] [address]  Phone: [phone]; Fax: [phone]

## 2023-02-09 ENCOUNTER — Other Ambulatory Visit: Payer: Self-pay | Admitting: Nurse Practitioner

## 2023-02-09 NOTE — Telephone Encounter (Signed)
This Pt was prescribed this by Robin Searing PA. Since this is not a cardiac med does Robin Searing PA want to refill?

## 2023-03-07 ENCOUNTER — Other Ambulatory Visit: Payer: Self-pay | Admitting: Nurse Practitioner

## 2023-03-07 NOTE — Telephone Encounter (Signed)
 Pt's pharmacy is requesting a refill on medication varenicline (Chantix), a non cardiac medication. Would Thomas Searing, NP like to refill this non cardiac medication? Please address

## 2023-03-22 ENCOUNTER — Ambulatory Visit: Payer: BLUE CROSS/BLUE SHIELD

## 2023-03-22 NOTE — Progress Notes (Deleted)
 Patient ID: Thomas Woods                 DOB: [birth year]                    MRN: [id number]      HPI: Thomas Woods is a 58 y.o. male patient referred to lipid clinic by Thomas Searing, NP. PMH is significant for CAD s/p anterior wall MI 03/2014 treated with 2 DES to occluded mLAD overlapping, medical therapy for moderate RCA disease, ICM (25-30%) with LifeVest now with normal EF, apical thrombus treated with Coumadin, HLD, HTN, lung nodules.   Patient had tried atorvastatin and rosuvastatin. Unable to tolerate it due to myalgia. Patient was on Repatha but copay was cost prohibitive same problem with Nexletol. Nexlizet is not covered by his insurance ( 07/2021) we will assess his insurance and see if there is a any change to that.    Reviewed options for lowering LDL cholesterol, including ezetimibe, PCSK-9 inhibitors, bempedoic acid and inclisiran.  Discussed mechanisms of action, dosing, side effects and potential decreases in LDL cholesterol.  Also reviewed cost information and potential options for patient assistance.  Current Medications: none  Intolerances: Zetia, Lipitor, Crestor - myalgia  Risk Factors: premature ASCVD, HLD, HTN LDL goal: <55 mg/dl   Diet:   Exercise:   Family History:   Social History:   Labs:  Lipid Panel     Component Value Date/Time   CHOL 177 05/04/2022 0910   TRIG 56 05/04/2022 0910   HDL 80 05/04/2022 0910   CHOLHDL 2.2 05/04/2022 0910   CHOLHDL 2.1 12/07/2015 1053   VLDL 8 12/07/2015 1053   LDLCALC 86 05/04/2022 0910   LABVLDL 11 05/04/2022 0910    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 30 tablet 3   busPIRone (BUSPAR) 10 MG tablet TAKE 1 TABLET(10 MG) BY MOUTH TWICE DAILY 60 tablet 11   carvedilol (COREG) 12.5 MG tablet Take 1 tablet (12.5 mg total) by mouth 2 (two) times daily. 180 tablet 1   clopidogrel (PLAVIX) 75 MG tablet TAKE 1 TABLET BY MOUTH EVERY DAY 30 tablet 10   gabapentin (NEURONTIN) 300 MG capsule Take 1 capsule (300 mg total) by mouth 2 (two) times daily. 180 capsule 0   lisinopril (ZESTRIL) 20 MG tablet Take 1 tablet (20 mg total) by mouth daily. 90 tablet 3   rosuvastatin (CRESTOR) 40 MG tablet Take 1 tablet by mouth once daily 90 tablet 3   tadalafil (CIALIS) 10 MG tablet TAKE 1 TABLET BY MOUTH DAILY AS NEEDED FOR ERECTILE DYSFUNCTION 8 tablet 3   traMADol (ULTRAM) 50 MG tablet Take 1 tablet (50 mg total) by mouth every 12 (twelve) hours as needed. 40 tablet 1  varenicline (CHANTIX) 1 MG tablet TAKE 1 TABLET BY MOUTH TWICE DAILY PLEASE  KEEP  APPOINTMENT  IN  APRIL  FOR  FUTURE  REFILLS 60 tablet 0   No current facility-administered medications on file prior to visit.    Allergies  Allergen Reactions   Zetia [Ezetimibe]     Joint pain    Assessment/Plan:  1. Hyperlipidemia -  No problems updated. No problem-specific Assessment & Plan notes found for this encounter.    Thank you,  Carmela Hurt, Pharm.D [facility] of [facility] [address] [place], [place] [address]  Phone: [phone]; Fax: [phone]

## 2023-03-23 ENCOUNTER — Other Ambulatory Visit: Payer: Self-pay

## 2023-03-23 ENCOUNTER — Encounter: Payer: Self-pay | Admitting: Emergency Medicine

## 2023-03-23 ENCOUNTER — Ambulatory Visit
Admission: EM | Admit: 2023-03-23 | Discharge: 2023-03-23 | Disposition: A | Discharge status: Home / Self Care | Attending: Internal Medicine | Admitting: Internal Medicine

## 2023-03-23 DIAGNOSIS — G8929 Other chronic pain: Secondary | ICD-10-CM

## 2023-03-23 DIAGNOSIS — M545 Low back pain, unspecified: Secondary | ICD-10-CM | POA: Diagnosis not present

## 2023-03-23 MED ORDER — METHOCARBAMOL 500 MG PO TABS: 500.0000 mg | ORAL_TABLET | Freq: Two times a day (BID) | ORAL | 0 refills | Status: AC | PRN

## 2023-03-23 MED ORDER — PREDNISONE 20 MG PO TABS: 40.0000 mg | ORAL_TABLET | Freq: Every day | ORAL | 0 refills | Status: AC

## 2023-03-23 NOTE — ED Triage Notes (Signed)
 Pt reports severe back pain with constant spasms x2.5 weeks. Pt has chronic back pain in general with 2 known herniated discs. Pain is radiating down both legs and experiencing numbness & tingling down L leg. No relief with heating pad and Doan's OTC at home. Pt has been seen here before and thinks he was prescribed a muscle relaxer that helped at that time.

## 2023-03-23 NOTE — Discharge Instructions (Signed)
 I have prescribed you a muscle relaxer and prednisone steroid to take for back pain.  Please be advised that muscle relaxer can make you drowsy so do not drive or drink alcohol while taking it.

## 2023-03-23 NOTE — ED Provider Notes (Addendum)
 [facility]    CSN: [id number] Arrival date & time: 03/23/23  1323      History   Chief Complaint Chief Complaint  Patient presents with   Back Pain    HPI Dev Dhondt is a 58 y.o. male.   Patient presents with a flareup of lower back pain that started about 2 to 3 weeks ago.  Reports that he has a history of chronic back pain where he has been told that he has herniated disks in his lower back.  He was previously seen by spine specialist.  Denies any recent injuries.  It does radiate down his right leg at times.  Denies urinary frequency, urinary or bowel continence, saddle anesthesia.  He is taking ibuprofen for pain.   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   On Coumadin   NSTEMI (non-ST elevated myocardial infarction) (HCC) 03/26/2014   Patient pain increased and he developed ECG changes, taken to the cath lab as a STEMI, PCI to the LAD    Patient Active Problem List   Diagnosis Date Noted   Radiculopathy, lumbar region 01/21/2020   Body mass index (BMI) 29.0-29.9, adult 12/24/2019   Disc displacement, lumbar 11/25/2019   Elevated blood-pressure reading, without diagnosis of hypertension 11/25/2019   Primary open-angle glaucoma 10/12/2016   Nuclear sclerotic cataract of both eyes 10/12/2016   Smoking addiction 08/03/2016   Adjustment disorder with mixed anxiety and depressed mood 08/03/2016   Essential (primary) hypertension 08/24/2015   Old MI (myocardial infarction) 08/24/2015   CAD (coronary artery disease) 02/23/2015   Lung nodule 10/20/2014   Cardiomyopathy, ischemic 10/20/2014   Erectile dysfunction 05/29/2014   Hyperlipidemia 05/29/2014    Encounter for follow-up 05/13/2014   Cigarette nicotine dependence with withdrawal 05/13/2014   Encounter for therapeutic drug monitoring 04/04/2014   LV (left ventricular) mural thrombus 03/29/2014   Tobacco abuse 03/27/2014   Acute anterior wall MI (HCC) 03/27/2014   NSTEMI (non-ST elevated myocardial infarction) (HCC) 03/27/2014    Past Surgical History:  Procedure Laterality Date   HERNIA REPAIR     LEFT HEART CATHETERIZATION WITH CORONARY ANGIOGRAM N/A 03/27/2014   Procedure: LEFT HEART CATHETERIZATION WITH CORONARY ANGIOGRAM;  Surgeon: Corky Crafts, MD; L main okay, LAD 100%, CFX okay, RI moderate disease, OM1 mild disease, RCA moderate disease, EF 30%   PERCUTANEOUS CORONARY STENT INTERVENTION (PCI-S)  03/27/2014   3.0 x 32 Synergy overlapped with 2.5 x 38 Synergy to the mLAD       Home Medications    Prior to Admission medications   Medication Sig Start Date End Date Taking? Authorizing Provider  carvedilol (COREG) 12.5 MG tablet Take 1 tablet (12.5 mg total) by mouth 2 (two) times daily. 05/04/22  Yes Gaston Islam., NP  clopidogrel (PLAVIX) 75 MG tablet TAKE 1 TABLET BY MOUTH EVERY DAY 05/31/22  Yes Gaston Islam., NP  lisinopril (ZESTRIL) 20 MG tablet Take 1 tablet (20 mg total) by mouth daily. 06/13/22  Yes Gaston Islam., NP  methocarbamol (ROBAXIN) 500 MG tablet Take 1 tablet (500 mg total) by mouth 2 (two) times daily as needed  for muscle spasms. 03/23/23  Yes Pamlea Finder, Rolly Salter E, FNP  predniSONE (DELTASONE) 20 MG tablet Take 2 tablets (40 mg total) by mouth daily for 5 days. 03/23/23 03/28/23 Yes Nyala Kirchner, Rolly Salter E, FNP  tadalafil (CIALIS) 10 MG tablet TAKE 1 TABLET BY MOUTH DAILY AS NEEDED FOR ERECTILE DYSFUNCTION 05/04/22  Yes Gaston Islam., NP  varenicline (CHANTIX) 1 MG tablet TAKE 1 TABLET BY MOUTH TWICE DAILY PLEASE  KEEP  APPOINTMENT  IN  APRIL  FOR  FUTURE  REFILLS 03/08/23  Yes Gaston Islam., NP  buPROPion (WELLBUTRIN XL) 150 MG 24 hr tablet Take 1  tablet (150 mg total) by mouth daily. Patient not taking: Reported on 03/23/2023 05/04/22   Gaston Islam., NP  busPIRone (BUSPAR) 10 MG tablet TAKE 1 TABLET(10 MG) BY MOUTH TWICE DAILY Patient not taking: Reported on 03/23/2023 05/04/22   Gaston Islam., NP  gabapentin (NEURONTIN) 300 MG capsule Take 1 capsule (300 mg total) by mouth 2 (two) times daily. Patient not taking: Reported on 03/23/2023 08/22/22   Tomi Bamberger, PA-C  rosuvastatin (CRESTOR) 40 MG tablet Take 1 tablet by mouth once daily Patient not taking: Reported on 03/23/2023 01/23/23   Corky Crafts, MD  traMADol (ULTRAM) 50 MG tablet Take 1 tablet (50 mg total) by mouth every 12 (twelve) hours as needed. Patient not taking: Reported on 03/23/2023 05/13/21   Hoy Register, MD    Family History Family History  Problem Relation Age of Onset   Healthy Mother    Healthy Sister    Healthy Brother    Heart attack Neg Hx    Hypertension Neg Hx    Stroke Neg Hx     Social History Social History   Tobacco Use   Smoking status: Light Smoker    Current packs/day: 0.20    Types: Cigarettes   Smokeless tobacco: Never   Tobacco comments:    3 cigarettes a day   Vaping Use   Vaping status: Never Used  Substance Use Topics   Alcohol use: Yes    Alcohol/week: 0.0 standard drinks of alcohol   Drug use: No     Allergies   Zetia [ezetimibe]   Review of Systems Review of Systems Per HPI  Physical Exam Triage Vital Signs ED Triage Vitals  Encounter Vitals Group     BP 03/23/23 1355 127/76     Systolic BP Percentile --      Diastolic BP Percentile --      Pulse Rate 03/23/23 1355 81     Resp 03/23/23 1355 18     Temp 03/23/23 1355 98.1 F (36.7 C)     Temp Source 03/23/23 1355 Oral     SpO2 03/23/23 1355 96 %     Weight --      Height --      Head Circumference --      Peak Flow --      Pain Score 03/23/23 1356 10     Pain Loc --      Pain Education --      Exclude from Growth Chart --    No  data found.  Updated Vital Signs BP 127/76 (BP Location: Left Arm)   Pulse 81   Temp 98.1 F (36.7 C) (Oral)   Resp 18   SpO2 96%   Visual Acuity Right Eye Distance:   Left Eye Distance:   Bilateral Distance:    Right Eye Near:   Left  Eye Near:    Bilateral Near:     Physical Exam Constitutional:      General: He is not in acute distress.    Appearance: Normal appearance. He is not toxic-appearing or diaphoretic.  HENT:     Head: Normocephalic and atraumatic.  Eyes:     Extraocular Movements: Extraocular movements intact.     Conjunctiva/sclera: Conjunctivae normal.  Pulmonary:     Effort: Pulmonary effort is normal.  Musculoskeletal:       Back:     Comments: Tenderness to palpation to left lower back.  No direct spinal tenderness, crepitus, step-off noted.  No swelling or discoloration.  Neurological:     General: No focal deficit present.     Mental Status: He is alert and oriented to person, place, and time. Mental status is at baseline.     Deep Tendon Reflexes: Reflexes are normal and symmetric.  Psychiatric:        Mood and Affect: Mood normal.        Behavior: Behavior normal.        Thought Content: Thought content normal.        Judgment: Judgment normal.      UC Treatments / Results  Labs (all labs ordered are listed, but only abnormal results are displayed) Labs Reviewed - No data to display  EKG   Radiology No results found.  Procedures Procedures (including critical care time)  Medications Ordered in UC Medications - No data to display  Initial Impression / Assessment and Plan / UC Course  I have reviewed the triage vital signs and the nursing notes.  Pertinent labs & imaging results that were available during my care of the patient were reviewed by me and considered in my medical decision making (see chart for details).     Patient presenting with flareup of chronic back pain. Imaging deferred given this is a chronic issue and  there is no direct injury or spinal tenderness.  Will treat with steroid burst as patient's last EF is normal.  Muscle relaxer also prescribed for patient.  Advised patient this can make him drowsy and do not drive or drink alcohol with taking it.  Advised to follow-up with PCP, urgent care, or spine specialist.  Educated patient that ibuprofen and blood thinning medications are not recommended to take together. Patient verbalized understanding and was agreeable with plan. Final Clinical Impressions(s) / UC Diagnoses   Final diagnoses:  Chronic left-sided low back pain, unspecified whether sciatica present     Discharge Instructions      I have prescribed you a muscle relaxer and prednisone steroid to take for back pain.  Please be advised that muscle relaxer can make you drowsy so do not drive or drink alcohol while taking it.    ED Prescriptions     Medication Sig Dispense Auth. Provider   methocarbamol (ROBAXIN) 500 MG tablet Take 1 tablet (500 mg total) by mouth 2 (two) times daily as needed for muscle spasms. 20 tablet [place], [place], [place]   predniSONE (DELTASONE) 20 MG tablet Take 2 tablets (40 mg total) by mouth daily for 5 days. 10 tablet [place] Bryant, [place]      PDMP not reviewed this encounter.   Gustavus Bryant, [place] 03/23/23 1712    Gustavus [place], [place] 03/23/23 [phone]

## 2023-04-06 ENCOUNTER — Telehealth: Payer: Self-pay | Admitting: Interventional Cardiology

## 2023-04-06 DIAGNOSIS — Z79899 Other long term (current) drug therapy: Secondary | ICD-10-CM

## 2023-04-06 DIAGNOSIS — E785 Hyperlipidemia, unspecified: Secondary | ICD-10-CM

## 2023-04-06 DIAGNOSIS — I10 Essential (primary) hypertension: Secondary | ICD-10-CM

## 2023-04-06 NOTE — Telephone Encounter (Signed)
 Spoke with patient, scheduled an annual follow up appointment on 05/30/23 with Thomas Searing, NP but with his change of insurance he now has a $100 copay for an office visit and cannot afford this. Not out of medication currently but wanted to touch base with our office ahead of time for possible assistance. Will place referral to our social work team. Patient thankful for the follow up and will await contact from social worker

## 2023-04-06 NOTE — Telephone Encounter (Signed)
 Pt c/o medication issue:  1. Name of Medication:   All medication  2. How are you currently taking this medication (dosage and times per day)?   As prescribed  3. Are you having a reaction (difficulty breathing--STAT)?   4. What is your medication issue?   Patient wants a call back regarding getting his medication refill.

## 2023-04-07 ENCOUNTER — Telehealth (HOSPITAL_COMMUNITY): Payer: Self-pay | Admitting: Licensed Clinical Social Worker

## 2023-04-07 NOTE — Telephone Encounter (Signed)
 CSW consulted to speak with pt about insurance copay concerns.  Attempted to reach pt to discuss- unable to reach- left VM requesting return call  Burna Sis, LCSW Clinical Social Worker [facility] Desk#: [phone] Cell#: [phone]

## 2023-04-10 ENCOUNTER — Telehealth (HOSPITAL_COMMUNITY): Payer: Self-pay | Admitting: Licensed Clinical Social Worker

## 2023-04-10 NOTE — Telephone Encounter (Signed)
 H&V Care Navigation CSW Progress Note  Clinical Social Worker received call back from pt regarding copay concerns.  Pt would not be able to afford $100 copay up front- CSW explained that after he receives bill he would be able to call billing department and set up payment plan over the course of several months- he reports if he did this it would not be an issue to pay.  CSW encouraged pt to reach back out if there were issues with this for any reason and I would assist as needed.   SDOH Screenings   Depression (PHQ2-9): Medium Risk (02/12/2020)  Social Connections: Unknown (05/23/2021)   Received from [facility]  Tobacco Use: High Risk (03/23/2023)    Burna Sis, LCSW Clinical Social Worker [facility] Desk#: [phone] Cell#: [phone]

## 2023-05-18 ENCOUNTER — Other Ambulatory Visit: Payer: Self-pay | Admitting: Nurse Practitioner

## 2023-05-29 NOTE — Progress Notes (Signed)
 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   Thomas Woods was last seen on 05/04/2022 for annual follow-up.  During visit he was doing well with no new cardiac complaints.  He was having affordability issues with some of his medications and was referred to social work for assistance.  I was able to review GoodRx and find affordable options for Plavix .  He continued to smoke and discontinued Wellbutrin  due to affordability.  Wellbutrin  was resumed at previous dose.  Thomas Woods presents today with his mother for annual follow-up. He has no new symptoms or issues related to his heart condition. He is experiencing issues with medication affordability, particularly with Plavix  75 mg, which he has missed for about a week. He manages his prescriptions on a bi-weekly basis due to his pay schedule and has been using GoodRx for assistance.  His mother helps him financially when needed. He fills his prescriptions at [facility] on [address]. 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 He drinks plenty of water and feels good overall, but his mother is concerned about his weight loss. He experiences occasional night sweats and chills. He smokes about three cigarettes a day and has previously used Chantix  successfully to quit smoking. He is currently on Wellbutrin  for smoking cessation but prefers to switch back to Chantix . He has been off Buspar  for anxiety for a while but would like to resume it. He has a history of multiple lung nodules noted on a CT scan in 2016, with the largest being 6 mm. He has not had a recent CT scan to monitor these nodules. Patient denies chest pain, palpitations, dyspnea, PND, orthopnea, nausea, vomiting, dizziness, syncope, edema, weight gain, or early satiety.  Discussed the use of AI scribe software for clinical note transcription with the patient, who gave verbal consent to proceed.  History of Present Illness   Review of Systems  Please see the history of present illness.    All other systems reviewed and are otherwise negative except as noted above.  Physical Exam     Wt Readings from Last 3 Encounters:  08/22/22 210 lb (95.3 kg)  05/04/22 193 lb (87.5 kg)  01/22/21 215 lb (97.5 kg)   BJ:YNWGN  were no vitals filed for this visit.,There is no height or weight on file to calculate BMI. GEN: Well nourished, well developed in no acute distress Neck: No JVD; No carotid bruits Pulmonary: Clear to auscultation without rales, wheezing or rhonchi  Cardiovascular: Normal rate. Regular rhythm. Normal S1. Normal S2.   Murmurs: There is no murmur.  ABDOMEN: Soft, non-tender, non-distended EXTREMITIES:  No edema; No deformity   EKG/LABS/ Recent Cardiac Studies   ECG personally reviewed by me today  -sinus rhythm with LVH and rate of 73 bpm with no acute changes consistent with previous EKG.  Risk Assessment/Calculations:          Lab Results  Component Value Date   WBC 4.4 05/04/2022   HGB 14.5 05/04/2022   HCT 42.6 05/04/2022   MCV 95 05/04/2022   PLT 181 05/04/2022   Lab Results  Component Value Date   CREATININE 0.79 05/04/2022   BUN 12 05/04/2022   NA 139 05/04/2022   K 4.2 05/04/2022   CL 101 05/04/2022   CO2 23 05/04/2022   Lab Results  Component Value Date   CHOL 177 05/04/2022   HDL 80 05/04/2022   LDLCALC 86 05/04/2022   TRIG 56 05/04/2022   CHOLHDL 2.2 05/04/2022    No results found for: "HGBA1C" Assessment & Plan    Assessment & Plan   1.  Coronary artery disease: -s/p anterior MI 03/2014 with DES x 2 to mLAD with 2 overlapping stents. -Today patient reports no chest pain or anginal equivalent since his previous visit. -Consulted with DOD regarding Plavix  and aspirin  monotherapy who recommended discontinuing Plavix  and starting aspirin  81 mg daily -Continue GDMT with ASA 81 mg, Crestor  40 mg, carvedilol  12.5 mg daily -Patient was counseled about Viagra  use and advised to not use within 48 hours of nitroglycerin  due to risk of severe hypotension, syncope, myocardial infarction, and even death.  2.  Ischemic cardiomyopathy: -Following anterior MI 03/2014 patient's EF reduced to 30% and was discharged with LifeVest.  2D echo was repeated and 2016 with improved EF of 50-55% -Today patient reports no palpitations and is euvolemic on examination today. -Continue carvedilol  12.5 mg daily   3.  Essential hypertension: -Patient's blood pressure today was well-controlled at 118/80 -Continue Zestril  20 mg daily and carvedilol  12.5 mg twice daily   4.  Hyperlipidemia: - Recheck LFT and lipids -Continue Crestor  40 mg daily   5.  History of tobacco abuse: -Smokes three cigarettes daily. Prefers Chantix  over bupropion  for cessation. - Discontinue  bupropion . - Prescribe Chantix  1 mg twice daily for smoking cessation. - Send prescription to [facility] pharmacy.  6. Lung nodules: Multiple nodules with increased lung cancer risk due to smoking history. Occasional night sweats and chills reported along with recent weight loss - Order CT chest without contrast for lung cancer screening. - Refer to pulmonologist for follow-up on lung nodules. -Check CBC, CMET, TSH,   Disposition: Follow-up with Avery Bodo, MD or APP in 12 months    Signed, Francene Ing, Retha Cast, NP 05/29/2023, 7:35 PM [facility]

## 2023-05-30 ENCOUNTER — Ambulatory Visit: Attending: Nurse Practitioner | Admitting: Nurse Practitioner

## 2023-05-30 ENCOUNTER — Other Ambulatory Visit (HOSPITAL_COMMUNITY): Payer: Self-pay

## 2023-05-30 ENCOUNTER — Encounter: Payer: Self-pay | Admitting: Nurse Practitioner

## 2023-05-30 VITALS — BP 118/80 | HR 73 | Ht 73.0 in | Wt 178.0 lb

## 2023-05-30 DIAGNOSIS — I25119 Atherosclerotic heart disease of native coronary artery with unspecified angina pectoris: Secondary | ICD-10-CM

## 2023-05-30 DIAGNOSIS — Z720 Tobacco use: Secondary | ICD-10-CM | POA: Diagnosis not present

## 2023-05-30 DIAGNOSIS — I255 Ischemic cardiomyopathy: Secondary | ICD-10-CM

## 2023-05-30 DIAGNOSIS — E785 Hyperlipidemia, unspecified: Secondary | ICD-10-CM

## 2023-05-30 DIAGNOSIS — I10 Essential (primary) hypertension: Secondary | ICD-10-CM | POA: Diagnosis not present

## 2023-05-30 MED ORDER — BUSPIRONE HCL 10 MG PO TABS: ORAL_TABLET | ORAL | 11 refills | Status: AC

## 2023-05-30 MED ORDER — ASPIRIN 81 MG PO TBEC
81.0000 mg | DELAYED_RELEASE_TABLET | Freq: Every day | ORAL | Status: AC
Start: 2023-05-30 — End: ?

## 2023-05-30 MED ORDER — VARENICLINE TARTRATE 1 MG PO TABS: 1.0000 mg | ORAL_TABLET | Freq: Two times a day (BID) | ORAL | 3 refills | Status: DC

## 2023-05-30 NOTE — Patient Instructions (Addendum)
 Medication Instructions:  STOP Plavix  START Aspirin  81mg  Take 1 tablet once a day  START CHANTIX   *If you need a refill on your cardiac medications before your next appointment, please call your pharmacy*  Lab Work: COMPLETE THIS WEEK-CBC, LIPIDS, CMET, MAG, FREE T4 & TSH If you have labs (blood work) drawn today and your tests are completely normal, you will receive your results only by: MyChart Message (if you have MyChart) OR A paper copy in the mail If you have any lab test that is abnormal or we need to change your treatment, we will call you to review the results.  Testing/Procedures: CT OF THE CHEST  Follow-Up: At [facility], you and your health needs are our priority.  As part of our continuing mission to provide you with exceptional heart care, our providers are all part of one team.  This team includes your primary Cardiologist (physician) and Advanced Practice Providers or APPs (Physician Assistants and Nurse Practitioners) who all work together to provide you with the care you need, when you need it.  Your next appointment:   6 month(s)  Provider:   Christena Covert, MD    We recommend signing up for the patient portal called "MyChart".  Sign up information is provided on this After Visit Summary.  MyChart is used to connect with patients for Virtual Visits (Telemedicine).  Patients are able to view lab/test results, encounter notes, upcoming appointments, etc.  Non-urgent messages can be sent to your provider as well.   To learn more about what you can do with MyChart, go to [email or web address].   Other Instructions 1-800-QUIT-NOW

## 2023-06-01 ENCOUNTER — Other Ambulatory Visit: Payer: Self-pay

## 2023-06-01 DIAGNOSIS — Z720 Tobacco use: Secondary | ICD-10-CM

## 2023-06-01 DIAGNOSIS — E785 Hyperlipidemia, unspecified: Secondary | ICD-10-CM

## 2023-06-01 DIAGNOSIS — I255 Ischemic cardiomyopathy: Secondary | ICD-10-CM

## 2023-06-01 DIAGNOSIS — I25119 Atherosclerotic heart disease of native coronary artery with unspecified angina pectoris: Secondary | ICD-10-CM

## 2023-06-01 DIAGNOSIS — I10 Essential (primary) hypertension: Secondary | ICD-10-CM

## 2023-06-02 ENCOUNTER — Ambulatory Visit: Payer: Self-pay | Admitting: Nurse Practitioner

## 2023-06-02 DIAGNOSIS — I712 Thoracic aortic aneurysm, without rupture, unspecified: Secondary | ICD-10-CM

## 2023-06-02 DIAGNOSIS — R911 Solitary pulmonary nodule: Secondary | ICD-10-CM

## 2023-06-02 DIAGNOSIS — E782 Mixed hyperlipidemia: Secondary | ICD-10-CM

## 2023-06-02 DIAGNOSIS — E041 Nontoxic single thyroid nodule: Secondary | ICD-10-CM

## 2023-06-02 LAB — TSH+FREE T4
Free T4: 1.29 ng/dL (ref 0.82–1.77)
TSH: 0.64 u[IU]/mL (ref 0.450–4.500)

## 2023-06-02 LAB — COMPREHENSIVE METABOLIC PANEL WITH GFR
ALT: 28 IU/L (ref 0–44)
AST: 34 IU/L (ref 0–40)
Albumin: 4.7 g/dL (ref 3.8–4.9)
Alkaline Phosphatase: 53 IU/L (ref 44–121)
BUN/Creatinine Ratio: 17 (ref 9–20)
BUN: 11 mg/dL (ref 6–24)
Bilirubin Total: 1.3 mg/dL — ABNORMAL HIGH (ref 0.0–1.2)
CO2: 21 mmol/L (ref 20–29)
Calcium: 10.2 mg/dL (ref 8.7–10.2)
Chloride: 99 mmol/L (ref 96–106)
Creatinine, Ser: 0.65 mg/dL — ABNORMAL LOW (ref 0.76–1.27)
Globulin, Total: 2.7 g/dL (ref 1.5–4.5)
Glucose: 98 mg/dL (ref 70–99)
Potassium: 4.4 mmol/L (ref 3.5–5.2)
Sodium: 138 mmol/L (ref 134–144)
Total Protein: 7.4 g/dL (ref 6.0–8.5)
eGFR: 110 mL/min/{1.73_m2} (ref >59)

## 2023-06-02 LAB — CBC
Hematocrit: 41.7 % (ref 37.5–51.0)
Hemoglobin: 14 g/dL (ref 13.0–17.7)
MCH: 32.5 pg (ref 26.6–33.0)
MCHC: 33.6 g/dL (ref 31.5–35.7)
MCV: 97 fL (ref 79–97)
Platelets: 245 10*3/uL (ref 150–450)
RBC: 4.31 x10E6/uL (ref 4.14–5.80)
RDW: 12.5 % (ref 11.6–15.4)
WBC: 4.1 10*3/uL (ref 3.4–10.8)

## 2023-06-02 LAB — LIPID PANEL
Chol/HDL Ratio: 2.4 ratio (ref 0.0–5.0)
Cholesterol, Total: 219 mg/dL — ABNORMAL HIGH (ref 100–199)
HDL: 93 mg/dL (ref >39)
LDL Chol Calc (NIH): 116 mg/dL — ABNORMAL HIGH (ref 0–99)
Triglycerides: 55 mg/dL (ref 0–149)
VLDL Cholesterol Cal: 10 mg/dL (ref 5–40)

## 2023-06-02 LAB — MAGNESIUM: Magnesium: 2.1 mg/dL (ref 1.6–2.3)

## 2023-06-08 ENCOUNTER — Ambulatory Visit (HOSPITAL_COMMUNITY)
Admission: RE | Admit: 2023-06-08 | Discharge: 2023-06-08 | Disposition: A | Discharge status: Home / Self Care | Source: Ambulatory Visit | Attending: Nurse Practitioner | Admitting: Nurse Practitioner

## 2023-06-08 ENCOUNTER — Other Ambulatory Visit (HOSPITAL_COMMUNITY): Payer: Self-pay

## 2023-06-08 ENCOUNTER — Telehealth: Payer: Self-pay

## 2023-06-08 DIAGNOSIS — I25119 Atherosclerotic heart disease of native coronary artery with unspecified angina pectoris: Secondary | ICD-10-CM | POA: Diagnosis present

## 2023-06-08 DIAGNOSIS — E785 Hyperlipidemia, unspecified: Secondary | ICD-10-CM | POA: Insufficient documentation

## 2023-06-08 DIAGNOSIS — Z720 Tobacco use: Secondary | ICD-10-CM | POA: Insufficient documentation

## 2023-06-08 DIAGNOSIS — I10 Essential (primary) hypertension: Secondary | ICD-10-CM | POA: Insufficient documentation

## 2023-06-08 DIAGNOSIS — I110 Hypertensive heart disease with heart failure: Secondary | ICD-10-CM

## 2023-06-08 DIAGNOSIS — I255 Ischemic cardiomyopathy: Secondary | ICD-10-CM | POA: Diagnosis present

## 2023-06-08 MED ORDER — CARVEDILOL 12.5 MG PO TABS
12.5000 mg | ORAL_TABLET | Freq: Two times a day (BID) | ORAL | 3 refills | Status: DC
  Filled 2023-06-08: qty 180, 90d supply, fill #0

## 2023-06-08 MED ORDER — LISINOPRIL 20 MG PO TABS
20.0000 mg | ORAL_TABLET | Freq: Every day | ORAL | 3 refills | Status: DC
Start: 2023-06-08 — End: 2023-06-08
  Filled 2023-06-08: qty 90, 90d supply, fill #0

## 2023-06-08 MED ORDER — LISINOPRIL 20 MG PO TABS
20.0000 mg | ORAL_TABLET | Freq: Every day | ORAL | 3 refills | Status: DC
  Filled 2023-06-08: qty 90, 90d supply, fill #0
  Filled 2023-10-19 – 2023-11-01 (×2): qty 90, 90d supply, fill #1

## 2023-06-08 NOTE — Telephone Encounter (Signed)
 The patient's carvedilol  and lisinopril  was sent to the [facility].  The patient would like to have his tadalafil  [facility]ill [facility]. I told the patient that I would send the nurse a message about the tadalafil .

## 2023-06-08 NOTE — Addendum Note (Signed)
 Addended by: Kimerly Rowand on: 06/08/2023 05:11 PM   Modules accepted: Orders

## 2023-06-08 NOTE — Telephone Encounter (Signed)
-----   Message from Everton S sent at 06/08/2023  4:57 PM EDT ----- Patient is asking about a prescription that the pharmacy didn't get. Can you please give the patient a call.

## 2023-06-09 NOTE — Telephone Encounter (Signed)
 Hey Thomas Woods is it ok to refill Cialis  [facility]?

## 2023-06-12 ENCOUNTER — Other Ambulatory Visit (HOSPITAL_COMMUNITY): Payer: Self-pay

## 2023-06-12 MED ORDER — TADALAFIL 10 MG PO TABS
10.0000 mg | ORAL_TABLET | Freq: Every day | ORAL | 3 refills | Status: DC
  Filled 2023-06-12: qty 8, 8d supply, fill #0
  Filled 2023-08-08 (×2): qty 8, 8d supply, fill #1
  Filled 2023-08-24: qty 8, 8d supply, fill #2
  Filled 2023-10-19 – 2023-11-01 (×2): qty 8, 8d supply, fill #3

## 2023-06-12 NOTE — Addendum Note (Signed)
 Addended by: Jamal Mays on: 06/12/2023 12:43 PM   Modules accepted: Orders

## 2023-06-12 NOTE — Telephone Encounter (Signed)
Rx(s) sent to pharmacy electronically. Patient has been notified and voiced understanding.

## 2023-06-15 ENCOUNTER — Telehealth: Payer: Self-pay | Admitting: Nurse Practitioner

## 2023-06-15 ENCOUNTER — Other Ambulatory Visit (HOSPITAL_COMMUNITY): Payer: Self-pay

## 2023-06-15 NOTE — Telephone Encounter (Signed)
 There is a patient here that have questions about 1 medication he is taking to see if he need to stop taking it  because he is currently taking baby medicine... Thomas Woods  04/22/1966

## 2023-06-16 NOTE — Telephone Encounter (Signed)
 Spoke with the patient who states he wanted to confirm that he was supposed to stop plavix  and start ba by aspirin  81mg  once a day.   The patient also states he completed a CT and has not heard back and want to check the status of the results.  Reviewed the medication with the patient.   Advised the patient that I would speak with Renelda Carry about test results and I would also try to speak with someone in the ct dept to get clarification of results.   Patient voiced understanding.

## 2023-06-20 NOTE — Telephone Encounter (Signed)
 Reached out to [employer] who recommended I contact the radiology dept to check the of the test.  I contacted radiology dept and the woman stated it may take about a week or longer for the images to be transferred over and read. She stated she would get started on this today.   Left message informing patient that CT is still processing.     Renelda Carry made aware.

## 2023-07-04 ENCOUNTER — Ambulatory Visit: Attending: Cardiovascular Disease | Admitting: Pharmacist

## 2023-07-04 ENCOUNTER — Other Ambulatory Visit (HOSPITAL_COMMUNITY): Payer: Self-pay

## 2023-07-04 DIAGNOSIS — E782 Mixed hyperlipidemia: Secondary | ICD-10-CM

## 2023-07-04 DIAGNOSIS — Z720 Tobacco use: Secondary | ICD-10-CM | POA: Diagnosis not present

## 2023-07-04 DIAGNOSIS — I25119 Atherosclerotic heart disease of native coronary artery with unspecified angina pectoris: Secondary | ICD-10-CM | POA: Diagnosis not present

## 2023-07-04 MED ORDER — ATORVASTATIN CALCIUM 20 MG PO TABS
20.0000 mg | ORAL_TABLET | Freq: Every day | ORAL | 3 refills | Status: DC
  Filled 2023-07-04: qty 90, 90d supply, fill #0

## 2023-07-04 MED ORDER — VARENICLINE TARTRATE 1 MG PO TABS
1.0000 mg | ORAL_TABLET | Freq: Two times a day (BID) | ORAL | 2 refills | Status: AC
  Filled 2023-07-04: qty 56, 28d supply, fill #0
  Filled 2023-08-24: qty 56, 28d supply, fill #1
  Filled 2023-10-19 – 2024-01-25 (×3): qty 56, 28d supply, fill #2

## 2023-07-04 NOTE — Patient Instructions (Addendum)
 Chantix  (varenicline ) instructions  Days 1 to 3: 0.5 (1/2 tablet) mg once daily.  Days 4 to 7: 0.5 mg (1/2 tablet) twice daily.  Maintenance (day 8 and later): 1 mg twice daily   Start taking atorvastatin  20mg  daily Please come for lab work in 2 months Please let me know if you have any issues with the medication [phone])

## 2023-07-04 NOTE — Assessment & Plan Note (Signed)
 Assessment: LDL-C above goal of <55 (116) Not currently on medication therapy  Muscle/joint pains with atorvastatin  80mg  and rosuvastatin  40mg  Cannot afford brand name medications due to high deductible. Still expensive with copay cards Discussed retrial of atorvastatin  or rosuvastatin  at lower doses Will use [facility] - he is able to get his medications cheaper with our cash options   Plan: Start atorvastatin  20mg  daily Repeat labs in 2 month- lipid and lft

## 2023-07-04 NOTE — Progress Notes (Signed)
 Patient ID: Thomas Woods                 DOB: Oct 08, 1965                    MRN: [id number]      HPI: Thomas Woods is a 58 y.o. male patient referred to lipid clinic by Thomas Woods. PMH is significant for CAD, HLD, HTN, LV thrombus in 2016, and ischemic cardiomyopathy. Had anterior NSTEMI 03/2014 with 2 DES to the LAD. At that time EF was 25%, but improved to 50% by 06/2014. Patient was previously taking Crestor  40 mg daily, however, lab results on 05/14/21 showed elevated LFTs. Patient reported drinking 3 beers and a few shots nightly at that time, which was likely contributing. Rosuvastatin  was held, alcohol use was decreased and LFT declined. He was resumed on rosuvastatin  20mg  daily. Repatha  nor Nexlizet  could be added due to cost.   At some point rosuvastatin  was increased back to 40mg  daily. Patient stopped it due to muscle aches.   Patient presents to clinic today. He is worried about getting back on Chantix  and his cholesterol. He feels much better off of rosuvastatin . He doesn't remember taking the 20mg , but does know he had been taking the 40mg . His wife insurance has a very high deductible. We have not been able to get him on name brand medications because of this. However, it does sound as though insurance covers smoking cessation medications at no cost.   Current Medications: none Intolerances: Atorvastatin  80 mg daily (joint pains), rosuvastatin  40mg  daily (muscle aches) Risk Factors: Premature ASCVD, HTN, HLD, prior MI LDL goal: <55 given history of premature ASCVD  Family History:  Family History  Problem Relation Age of Onset   Healthy Mother    Healthy Sister    Healthy Brother    Heart attack Neg Hx    Hypertension Neg Hx    Stroke Neg Hx      Social History: + tobacco use  Labs: Lipid Panel     Component Value Date/Time   CHOL 219 (H) 06/01/2023 1124   TRIG 55 06/01/2023 1124   HDL 93 06/01/2023 1124   CHOLHDL 2.4 06/01/2023 1124   CHOLHDL 2.1 12/07/2015 1053    VLDL 8 12/07/2015 1053   LDLCALC 116 (H) 06/01/2023 1124   LABVLDL 10 06/01/2023 1124    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 Swallow whole.     busPIRone  (BUSPAR ) 10 MG tablet TAKE 1 TABLET(10 MG) BY MOUTH TWICE DAILY 60 tablet 11   carvedilol  (COREG ) 12.5 MG tablet Take 1 tablet (12.5 mg total) by mouth 2 (two) times daily. 180 tablet 3   lisinopril  (ZESTRIL ) 20 MG tablet Take 1 tablet (20 mg total) by mouth daily. 90  tablet 3   methocarbamol  (ROBAXIN ) 500 MG tablet Take 1 tablet (500 mg total) by mouth 2 (two) times daily as needed for muscle spasms. 20 tablet 0   tadalafil  (CIALIS ) 10 MG tablet Take 1 tablet (10 mg total) by mouth daily for erectile dysfunction. 8 tablet 3   traMADol  (ULTRAM ) 50 MG tablet Take 1 tablet (50 mg total) by mouth every 12 (twelve) hours as needed. 40 tablet 1   No current facility-administered medications on file prior to visit.    Allergies  Allergen Reactions   Zetia  [Ezetimibe ]     Joint pain    Assessment/Plan:  1. Hyperlipidemia -  Hyperlipidemia Assessment: LDL-C above goal of <55 (116) Not currently on medication therapy  Muscle/joint pains with atorvastatin  80mg  and rosuvastatin  40mg  Cannot afford brand name medications due to high deductible. Still expensive with copay cards Discussed retrial of  atorvastatin  or rosuvastatin  at lower doses Will use [facility] - he is able to get his medications cheaper with our cash options   Plan: Start atorvastatin  20mg  daily Repeat labs in 2 month- lipid and lft  Tobacco abuse Assessment: Patient has increased smoking since coming off of chantix  Patient would like to get back on medication Confirmed with pharmacy that meds is covered at $0 with his insurance Educated on starting dose   Plan: Resume chantix - 0.5mg  daily for 3 days, the 0.5mg  BID for 4 days then 1mg  BID Start at least 2 weeks prior to quit date.    Thank you,  Thomas Woods D Thomas Woods, Pharm.Thomas Woods, CPP [facility] [address] [place], [place] [address]  Phone: [phone]; Fax: [phone]

## 2023-07-04 NOTE — Assessment & Plan Note (Signed)
 Assessment: Patient has increased smoking since coming off of chantix  Patient would like to get back on medication Confirmed with pharmacy that meds is covered at $0 with his insurance Educated on starting dose   Plan: Resume chantix - 0.5mg  daily for 3 days, the 0.5mg  BID for 4 days then 1mg  BID Start at least 2 weeks prior to quit date.

## 2023-07-31 ENCOUNTER — Ambulatory Visit (HOSPITAL_COMMUNITY)
Admission: RE | Admit: 2023-07-31 | Discharge: 2023-07-31 | Disposition: A | Discharge status: Home / Self Care | Source: Ambulatory Visit | Attending: Nurse Practitioner | Admitting: Nurse Practitioner

## 2023-08-03 ENCOUNTER — Ambulatory Visit: Admitting: Pharmacist Clinician (PhC)/ Clinical Pharmacy Specialist

## 2023-08-08 ENCOUNTER — Ambulatory Visit (HOSPITAL_COMMUNITY)
Admission: RE | Admit: 2023-08-08 | Discharge: 2023-08-08 | Disposition: A | Discharge status: Home / Self Care | Source: Ambulatory Visit | Attending: Nurse Practitioner | Admitting: Nurse Practitioner

## 2023-08-08 ENCOUNTER — Other Ambulatory Visit (HOSPITAL_COMMUNITY): Payer: Self-pay

## 2023-08-08 DIAGNOSIS — E041 Nontoxic single thyroid nodule: Secondary | ICD-10-CM | POA: Insufficient documentation

## 2023-08-09 ENCOUNTER — Other Ambulatory Visit: Payer: Self-pay

## 2023-08-11 ENCOUNTER — Ambulatory Visit: Payer: Self-pay | Admitting: General Practice

## 2023-08-24 ENCOUNTER — Other Ambulatory Visit (HOSPITAL_COMMUNITY): Payer: Self-pay

## 2023-09-07 ENCOUNTER — Ambulatory Visit (INDEPENDENT_AMBULATORY_CARE_PROVIDER_SITE_OTHER): Admitting: Emergency Medicine

## 2023-09-07 ENCOUNTER — Encounter: Payer: Self-pay | Admitting: Emergency Medicine

## 2023-09-07 ENCOUNTER — Telehealth: Payer: Self-pay | Admitting: Emergency Medicine

## 2023-09-07 VITALS — BP 155/96 | HR 80 | Ht 72.0 in | Wt 180.0 lb

## 2023-09-07 DIAGNOSIS — Z0000 Encounter for general adult medical examination without abnormal findings: Secondary | ICD-10-CM

## 2023-09-07 DIAGNOSIS — R918 Other nonspecific abnormal finding of lung field: Secondary | ICD-10-CM

## 2023-09-07 DIAGNOSIS — F17210 Nicotine dependence, cigarettes, uncomplicated: Secondary | ICD-10-CM | POA: Diagnosis not present

## 2023-09-07 DIAGNOSIS — Z720 Tobacco use: Secondary | ICD-10-CM

## 2023-09-07 NOTE — Progress Notes (Signed)
 Subjective:    Patient ID: Thomas Woods, male    DOB: 11-26-1965, 58 y.o.   MRN: [id number]  HPI Discussed the use of AI scribe software for clinical note transcription with the patient, who gave verbal consent to proceed.  History of Present Illness Thomas Woods is a 58 year old male with a history of tobacco use, hypertension, CAD, hyperlipidemia, and ischemic cardiomyopathy who presents for evaluation of pulmonary nodules and lung cancer screening. He was referred for evaluation following a lung cancer screening CT scan.  He has a significant history of tobacco use, having smoked since the age of 17, totaling approximately 40 years. He is currently using Chantix  to aid in smoking cessation and has significantly reduced his smoking habit, now only desiring a cigarette after meals. Previously, he smoked nearly a pack a day.  He has a history of ischemic cardiomyopathy with an LV thrombus diagnosed in 2016 and has experienced a heart attack in the past. He is currently on baby aspirin  after being taken off blood thinners.  A lung cancer screening CT performed on Jun 01, 2023, revealed a 4.4 cm ascending thoracic aorta, bilateral thyroid  nodules up to 1.5 cm, centrolobular and paraseptal emphysema, and scattered tiny bilateral pulmonary nodules up to 3.8 mm, none of which were suspicious. Previous CT scans dating back to 2016 showed perihilar nodules that resolved by December 2016.  An ultrasound of the thyroid  on August 08, 2023, showed two solid moderately suspicious thyroid  nodules without calcification, measuring 1.3 cm on the right and 1.2 cm on the left.  No shortness of breath or cough. 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 He has not undergone pulmonary function testing and is not on any bronchodilator therapy.  Family history includes an aunt who passed away from lung cancer and an uncle who had cancer, possibly  prostate cancer. His mother does not have cancer, and he is unaware of his father's medical history.    Results RADIOLOGY CT Chest: 4.4 cm ascending thoracic aorta, bilateral thyroid  nodules up to 1.5 cm, no mediastinal or hilar lymphadenopathy, centrilobular and paraseptal emphysema, scattered tiny bilateral pulmonary nodules up to 3.8 mm, none suspicious (06/01/2023)  Thyroid  Ultrasound: Two solid moderately suspicious thyroid  nodules without calcification, 1.3 cm on the right and 1.2 cm on the left (08/08/2023)    Review of Systems As per HPI  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   On Coumadin    NSTEMI (non-ST elevated myocardial infarction) (HCC) 03/26/2014   Patient pain increased and he developed ECG changes, taken to the cath lab as a STEMI, PCI to the LAD    Family History  Problem Relation Age of Onset   Healthy Mother    Healthy Sister    Healthy Brother    Heart attack Neg Hx    Hypertension Neg Hx    Stroke Neg Hx      Social History   Socioeconomic History   Marital status: Married    Spouse name: Not on file   Number of children: Not on file   Years of education:  Not on file   Highest education level: Not on file  Occupational History   Not on file  Tobacco Use   Smoking status: Light Smoker    Current packs/day: 0.20    Types: Cigarettes   Smokeless tobacco: Never   Tobacco comments:    3 cigarettes a day   Vaping Use   Vaping status: Never Used  Substance and Sexual Activity   Alcohol use: Yes    Alcohol/week: 0.0 standard drinks of alcohol   Drug use: No   Sexual activity: Yes  Other Topics Concern   Not on file  Social History Narrative   Not on  file   Social Drivers of Health   Financial Resource Strain: Not on file  Food Insecurity: Not on file  Transportation Needs: Not on file  Physical Activity: Not on file  Stress: Not on file  Social Connections: Unknown (05/23/2021)   [facility] from [facility]   Social Network    Social Network: Not on file  Intimate Partner Violence: Unknown (04/14/2021)   Received from [facility]   HITS    Physically Hurt: Not on file    Insult or Talk Down To: Not on file    Threaten Physical Harm: Not on file    Scream or Curse: Not on file    Allergies  Allergen Reactions   Zetia  [Ezetimibe ]     Joint pain    Current Outpatient Medications on File Prior to Visit  Medication Sig Dispense Refill   aspirin  EC 81 MG tablet Take 1 tablet (81 mg total) by mouth daily. Swallow whole.     atorvastatin  (LIPITOR ) 20 MG tablet Take 1 tablet (20 mg total) by mouth daily. 90 tablet 3   busPIRone  (BUSPAR ) 10 MG tablet TAKE 1 TABLET(10 MG) BY MOUTH TWICE DAILY 60 tablet 11   carvedilol  (COREG ) 12.5 MG tablet Take 1 tablet (12.5 mg total) by mouth 2 (two) times daily. 180 tablet 3   lisinopril  (ZESTRIL ) 20 MG tablet Take 1 tablet (20 mg total) by mouth daily. 90 tablet 3   methocarbamol  (ROBAXIN ) 500 MG tablet Take 1 tablet (500 mg total) by mouth 2 (two) times daily as needed for muscle spasms. 20 tablet 0   tadalafil  (CIALIS ) 10 MG tablet Take 1 tablet (10 mg total) by mouth daily as needed for erectile dysfunction. 8 tablet 3   traMADol  (ULTRAM ) 50 MG tablet Take 1 tablet (50 mg total) by mouth every 12 (twelve) hours as needed. 40 tablet 1   varenicline  (CHANTIX ) 1 MG tablet Take 1 tablet (1 mg total) by mouth 2 (two) times daily. 180 tablet 2   No current facility-administered medications on file prior to visit.       Objective:    Vitals:   09/07/23 0823  BP: (!) 155/96  Pulse: 80  SpO2: 98%  Weight: 180 lb (81.6 kg)  Height: 6' (1.829 m)   Physical Exam Gen: Pleasant,  well-nourished, in no distress,  normal affect  ENT: No lesions,  mouth clear,  oropharynx clear, no postnasal drip  Neck: No JVD, no stridor  Lungs: No use of accessory muscles, no crackles or wheezing on normal respiration, no wheeze on forced expiration  Cardiovascular: RRR, heart sounds normal, no murmur or gallops, no peripheral edema  Musculoskeletal: No deformities, no cyanosis or clubbing  Neuro: alert, awake, non focal  Skin: Warm, no lesions or rashes       Assessment & Plan:   Assessment & Plan  Encounter for general health examination  Tobacco abuse   Assessment and Plan Assessment & Plan Bilateral stable pulmonary nodules under surveillance Bilateral pulmonary nodules up to 3.8 mm on CT, consistent with benign findings. No mediastinal or hilar lymphadenopathy. Lung cancer screening indicated due to tobacco use history. - Order repeat CT scan of the chest in May 2026 to monitor pulmonary nodules. - Perform baseline pulmonary function tests within the next year to assess lung capacity and strength.  Centrilobular and paraseptal emphysema Centrilobular and paraseptal emphysema on CT. No symptoms of dyspnea or cough. Functional capacity is good. No bronchodilator therapy needed. Emphasized smoking cessation to prevent progression. -Plan to perform baseline pulmonary function testing to assess any degree of obstruction  Tobacco use disorder, current Tobacco use for approximately 40 years, currently on Chantix . Smoking reduced to occasional use, primarily postprandial. Continued cessation critical to reduce lung cancer and other tobacco-related disease risks. - Continue Chantix  for smoking cessation. - Encourage complete cessation of smoking.  Bilateral thyroid  nodules under surveillance Bilateral thyroid  nodules on ultrasound, measuring 1.3 cm on the right and 1.2 cm on the left. Nodules are solid and moderately suspicious without calcification. Serial follow-up  recommended without biopsy. - Schedule repeat thyroid  ultrasound in July 2026 for surveillance.  Thoracic aortic aneurysm - Planning for repeat CT imaging in 1 year May 2026     No follow-ups on file.   Lamar Chris, MD, PhD 09/07/2023, 8:53 AM [facility] [phone] or if no answer before 7:00PM call [phone] For any issues after 7:00PM please call eLink [phone]

## 2023-09-07 NOTE — Telephone Encounter (Signed)
 okay

## 2023-09-07 NOTE — Patient Instructions (Signed)
  VISIT SUMMARY: You came in today for an evaluation of pulmonary nodules and lung cancer screening. You have a history of smoking, hypertension, coronary artery disease, high cholesterol, and ischemic cardiomyopathy. Your recent CT scan showed some findings that we will continue to monitor, including pulmonary nodules and thyroid  nodules. You are currently using Chantix  to help quit smoking and have significantly reduced your smoking habit.  YOUR PLAN: -BILATERAL STABLE PULMONARY NODULES UNDER SURVEILLANCE: Pulmonary nodules are small growths in the lungs that are often benign. Your CT scan showed nodules that are not suspicious for cancer. We will repeat the CT scan in May 2026 to monitor these nodules and perform baseline pulmonary function tests within the next year to assess your lung capacity and strength.  -CENTRILOBULAR AND PARASEPTAL EMPHYSEMA: Emphysema is a condition where the air sacs in the lungs are damaged, causing breathlessness. Your CT scan showed emphysema, but you do not have symptoms like shortness of breath or cough. No bronchodilator therapy is needed at this time. It is important to continue working on quitting smoking to prevent the condition from getting worse.  -TOBACCO USE DISORDER, CURRENT: Tobacco use disorder means you have a dependence on tobacco. You have been smoking for about 40 years but have reduced your smoking significantly with the help of Chantix . It is crucial to continue working towards completely quitting smoking to lower your risk of lung cancer and other diseases. Continue using Chantix  as prescribed and aim for complete cessation.  -BILATERAL THYROID  NODULES UNDER SURVEILLANCE: Thyroid  nodules are growths in the thyroid  gland. Your ultrasound showed nodules that are moderately suspicious but do not have calcification. We will monitor these nodules with a repeat ultrasound in July 2026.  INSTRUCTIONS: Please schedule a repeat CT scan of your chest in May  2026 and a repeat thyroid  ultrasound in July 2026. Additionally, arrange for baseline pulmonary function tests within the next year to assess your lung capacity and strength. Continue using Chantix  as prescribed and work towards completely quitting smoking.

## 2023-09-07 NOTE — Telephone Encounter (Signed)
 Hey there we need a new order for this. For primary care and not DME. Please advise

## 2023-09-19 ENCOUNTER — Telehealth: Payer: Self-pay | Admitting: Pharmacist

## 2023-09-19 NOTE — Telephone Encounter (Signed)
 Called pt and LVM reminding him to come for fasting labs

## 2023-09-19 NOTE — Telephone Encounter (Signed)
 Patient returned Pharmacist Melissa's call and stated he will have his lab work done within the next couple of days.

## 2023-10-05 ENCOUNTER — Encounter

## 2023-10-09 ENCOUNTER — Encounter

## 2023-10-19 ENCOUNTER — Other Ambulatory Visit: Payer: Self-pay

## 2023-10-19 ENCOUNTER — Other Ambulatory Visit (HOSPITAL_COMMUNITY): Payer: Self-pay

## 2023-10-30 ENCOUNTER — Other Ambulatory Visit (HOSPITAL_COMMUNITY): Payer: Self-pay

## 2023-11-01 ENCOUNTER — Other Ambulatory Visit (HOSPITAL_COMMUNITY): Payer: Self-pay

## 2023-11-01 ENCOUNTER — Telehealth: Payer: Self-pay | Admitting: Pharmacist

## 2023-11-01 DIAGNOSIS — I110 Hypertensive heart disease with heart failure: Secondary | ICD-10-CM

## 2023-11-01 DIAGNOSIS — I10 Essential (primary) hypertension: Secondary | ICD-10-CM

## 2023-11-01 MED ORDER — ATORVASTATIN CALCIUM 20 MG PO TABS
20.0000 mg | ORAL_TABLET | Freq: Every day | ORAL | 5 refills | Status: DC
  Filled 2023-11-01: qty 30, 30d supply, fill #0

## 2023-11-01 MED ORDER — CARVEDILOL 12.5 MG PO TABS
12.5000 mg | ORAL_TABLET | Freq: Two times a day (BID) | ORAL | 5 refills | Status: AC
  Filled 2023-11-01: qty 60, 30d supply, fill #0

## 2023-11-01 MED ORDER — LISINOPRIL 20 MG PO TABS
20.0000 mg | ORAL_TABLET | Freq: Every day | ORAL | 5 refills | Status: DC
  Filled 2023-11-01: qty 30, 30d supply, fill #0
  Filled 2024-01-25: qty 30, 30d supply, fill #1

## 2023-11-01 NOTE — Telephone Encounter (Signed)
 Pt c/o medication issue:  1. Name of Medication:   lisinopril  (ZESTRIL ) 20 MG tablet    tadalafil  (CIALIS ) 10 MG tablet  varenicline  (CHANTIX ) 1 MG tablet   2. How are you currently taking this medication (dosage and times per day)?   3. Are you having a reaction (difficulty breathing--STAT)? No   4. What is your medication issue? Pt states his insurance was terminated and he cannot afford his Rxs. Pt states he had a similar situation happen before and E. Dick gave him resources. Please advise.

## 2023-11-01 NOTE — Telephone Encounter (Signed)
 Routed to LCSW for assistance.   [facility] discount list (for cardiac meds) No atorvastatin  (simvastatin ) No carvedilol  (lopressor , toprol ) Lisinopril  20mg  = $9/30 days  [facility] discount list Atorvastatin  $5Carvedilol  $5Lisinopril  $5 ** refills sent  Of note, buspar  is also $5 ansildenafil  is $10 (ntadalafil ) -- will defer to [facility] NP to advise on refilling these  He said Chantix  is working well for his smoking cessation. Not on discount drug list. Will see if LCSW has advice.

## 2023-11-02 NOTE — Telephone Encounter (Addendum)
 It looks like he may need refills on buspar  and tadalafil . They were previously refilled by [facility]. Are you okay with refilling? However it looks like he needs it to be changed to sildenafil ? There is a message in his chart. I was not sure since you have not seen him in clinic yet.

## 2023-11-06 NOTE — Telephone Encounter (Signed)
 Patient identification verified by 2 forms. Relied Dr. Ruthanne message.  Phone cut out, called pt back. No answer. Left message wit details and number if he wants to call back.

## 2023-11-08 ENCOUNTER — Other Ambulatory Visit (HOSPITAL_COMMUNITY): Payer: Self-pay

## 2023-11-08 ENCOUNTER — Telehealth: Payer: Self-pay | Admitting: Licensed Clinical Social Worker

## 2023-11-09 NOTE — Progress Notes (Signed)
 Heart and Vascular Care Navigation  11/08/2023- ** LATE ENTRYMontrice Woods [birth year] [id number]  Reason for Referral: insurance concerns/medication affordability   Engaged with patient by telephone for initial visit for Heart and Vascular Care Coordination.                                                                                                   Assessment:         LCSW was able to reach pt today at [phone]. Introduced self, role, reason for call. Confirmed home address, full name and DOB. Pt and pt wife work full time, pt previously had [employer] but that coverage termed. No issues with He has been advised to go to apply for Medicaid but has not yet done so. He had spoken with RN about medication costs and finds the [facility] pharmacy will be more affordable for him, he is aware that there are some medications our team cannot fill bc they are not cardiac medications. Encouraged him to contact his PCP to re-establish for those medications.   LCSW discussed options for medication assistance including [facility], [facility] program. We also discussed assistance with medical bill costs. Encouraged pt to f/u with DSS to submit a Medicaid application and provided pt with satellite DSS office at [facility]. I also dicussed if not Medicaid eligible the Marketplace or [facility] Financial Assistance are both options for assistance with past and future bills. Pt agreeable to this information being sent to him. He does not have access to myChart so I will send him that information to change the listed number and receive new access.   Unfortunately phone disconnected, sent pt text at his request before call disconnected with contact numbers.                              HRT/VAS Care Coordination     Patients Home Cardiology Office [facility]   Outpatient Care Team Social Worker   Social Worker Name: Marit Lark, [place], [phone]   Living arrangements for the past 2  months Apartment   Lives with: Spouse   Patient Current Insurance Coverage Self-Pay   Patient Has Concern With Paying Medical Bills Yes   Patient Concerns With Medical Bills self pay, needs to apply for Medicaid per employer   Medical Bill Referrals: texted Medicaid satellite office, Medicaid guidelines and CAFA if ineligible for Medicaid   Does Patient Have Prescription Coverage? No   Patient Prescription Assistance Programs [place] [facility]   Home Assistive Devices/Equipment None       Social History:                                                                             SDOH Screenings   Food Insecurity:  Food Insecurity Present (11/09/2023)  Housing: Low Risk  (11/09/2023)  Transportation Needs: No Transportation Needs (11/09/2023)  Utilities: Not At Risk (11/09/2023)  Depression (PHQ2-9): Medium Risk (02/12/2020)  Financial Resource Strain: Medium Risk (11/09/2023)  Social Connections: Unknown (05/23/2021)   Received from [facility]  Tobacco Use: High Risk (09/07/2023)  Health Literacy: Adequate Health Literacy (11/09/2023)    SDOH Interventions: Financial Resources:  Surveyor, Quantity Strain Interventions: Artist, Other (Comment) (referred for [employer], [place], [place] screening) DSS for financial assistance and Editor, Commissioning for [employer] Program  Food Insecurity:  Food Insecurity Interventions: [employer] Resources Provided  Housing Insecurity:  Housing Interventions: Intervention Not Indicated  Transportation:   Transportation Interventions: Intervention Not Indicated     Other Care Navigation Interventions:     Provided Pharmacy assistance resources [facility]   Follow-up plan:   LCSW sent pt a text with my contact information, MyChart contact information, address for Medicaid satellite office and number for PCP for medications that our team cannot fill. LCSW will mail pt my card, food resources, Medicaid flyer,  Producer, television/film/video and [employer] application.

## 2023-11-20 ENCOUNTER — Telehealth: Payer: Self-pay | Admitting: Licensed Clinical Social Worker

## 2023-11-20 NOTE — Telephone Encounter (Signed)
 H&V Care Navigation CSW Progress Note  Clinical Social Worker received a call back from pt who shares he received items in the mail. We reviewed packet. Provided him with satellite office address and location and he will try and go today. Encouraged him to let me know if he has any updates from them regarding eligibility.  Patient is participating in a Managed Medicaid Plan:  No, self pay only  SDOH Screenings   Food Insecurity: Food Insecurity Present (11/09/2023)  Housing: Low Risk  (11/09/2023)  Transportation Needs: No Transportation Needs (11/09/2023)  Utilities: Not At Risk (11/09/2023)  Depression (PHQ2-9): Medium Risk (02/12/2020)  Financial Resource Strain: Medium Risk (11/09/2023)  Social Connections: Unknown (05/23/2021)   Received from [facility]  Tobacco Use: High Risk (09/07/2023)  Health Literacy: Adequate Health Literacy (11/09/2023)    Marit Lark, MSW, LCSW Clinical Social Worker II [facility] Heart/Vascular Care Navigation  [phone]- work cell phone (preferred)

## 2023-11-20 NOTE — Telephone Encounter (Signed)
 H&V Care Navigation CSW Progress Note  Clinical Social Worker contacted patient by phone to f/u on patient assistance forms and DSS recommendation. Pt reached at [phone]. Re-introduced self, role, reason for call. Pt will check mail today, doesn't think he received that paperwork we sent yet. Has been working frequently but plans to go to DSS today. Will let me know if paperwork received or not.  Patient is participating in a Managed Medicaid Plan:  No, self pay only  SDOH Screenings   Food Insecurity: Food Insecurity Present (11/09/2023)  Housing: Low Risk  (11/09/2023)  Transportation Needs: No Transportation Needs (11/09/2023)  Utilities: Not At Risk (11/09/2023)  Depression (PHQ2-9): Medium Risk (02/12/2020)  Financial Resource Strain: Medium Risk (11/09/2023)  Social Connections: Unknown (05/23/2021)   Received from [facility]  Tobacco Use: High Risk (09/07/2023)  Health Literacy: Adequate Health Literacy (11/09/2023)    Marit Lark, MSW, LCSW Clinical Social Worker II [facility] Heart/Vascular Care Navigation  [phone]- work cell phone (preferred)

## 2023-12-11 ENCOUNTER — Telehealth (HOSPITAL_BASED_OUTPATIENT_CLINIC_OR_DEPARTMENT_OTHER): Payer: Self-pay | Admitting: Licensed Clinical Social Worker

## 2023-12-11 NOTE — Telephone Encounter (Signed)
 H&V Care Navigation CSW Progress Note  Clinical Social Worker received call back from pt today after he got off work. Pt able to locate letter from DSS- states he was approved for family planning Medicaid- confirmed with [employer] (ID#[id number]). We discussed in depth that likely he was over income for Medicaid coverage, is not disabled/not 65 so does not meet Medicare criteria. He can review options for Marketplace coverage or apply for [facility] [employer] assistance and [employer] to assist with costs of medications until approved. Pt okay with me sending that via mail. LCSW also discussed how to call pharmacy for fill for medications and since cost is a concern to ask if they can be filled 30 days at a time.   Patient is participating in a Managed Medicaid Plan:  No, self pay only  SDOH Screenings   Food Insecurity: Food Insecurity Present (11/09/2023)  Housing: Low Risk  (11/09/2023)  Transportation Needs: No Transportation Needs (11/09/2023)  Utilities: Not At Risk (11/09/2023)  Depression (PHQ2-9): Medium Risk (02/12/2020)  Financial Resource Strain: Medium Risk (11/09/2023)  Social Connections: Unknown (05/23/2021)   Received from [facility]  Tobacco Use: High Risk (09/07/2023)  Health Literacy: Adequate Health Literacy (11/09/2023)    Marit Lark, MSW, LCSW Clinical Social Worker II [facility] Health Heart/Vascular Care Navigation  [phone]- work cell phone (preferred)

## 2023-12-11 NOTE — Telephone Encounter (Signed)
 H&V Care Navigation CSW Progress Note  Clinical Social Worker contacted patient by phone to f/u on Medicaid referral. No answer today at [phone], left voicemail and will re-attempt again as able.  Patient is participating in a Managed Medicaid Plan:  No, self pay only  SDOH Screenings   Food Insecurity: Food Insecurity Present (11/09/2023)  Housing: Low Risk  (11/09/2023)  Transportation Needs: No Transportation Needs (11/09/2023)  Utilities: Not At Risk (11/09/2023)  Depression (PHQ2-9): Medium Risk (02/12/2020)  Financial Resource Strain: Medium Risk (11/09/2023)  Social Connections: Unknown (05/23/2021)   Received from [facility]  Tobacco Use: High Risk (09/07/2023)  Health Literacy: Adequate Health Literacy (11/09/2023)    Marit Lark, MSW, LCSW Clinical Social Worker II [facility] Heart/Vascular Care Navigation  [phone]- work cell phone (preferred)

## 2023-12-20 ENCOUNTER — Telehealth: Payer: Self-pay | Admitting: Licensed Clinical Social Worker

## 2023-12-20 NOTE — Telephone Encounter (Signed)
 H&V Care Navigation CSW Progress Note  Clinical Social Worker contacted patient by phone to f/u on assistance applications. Pt reached at [phone]. Pt shared that he has still not received information, will check over the next few days and let me know. Advised him I will be out of clinic this week, he is aware he can reach my colleague if needed.  Patient is participating in a Managed Medicaid Plan:  No, self pay only  SDOH Screenings   Food Insecurity: Food Insecurity Present (11/09/2023)  Housing: Low Risk  (11/09/2023)  Transportation Needs: No Transportation Needs (11/09/2023)  Utilities: Not At Risk (11/09/2023)  Depression (PHQ2-9): Medium Risk (02/12/2020)  Financial Resource Strain: Medium Risk (11/09/2023)  Social Connections: Unknown (05/23/2021)   Received from [facility]  Tobacco Use: High Risk (09/07/2023)  Health Literacy: Adequate Health Literacy (11/09/2023)    Marit Lark, MSW, LCSW Clinical Social Worker II [facility] Health Heart/Vascular Care Navigation  [phone]- work cell phone (preferred)

## 2023-12-26 ENCOUNTER — Telehealth: Payer: Self-pay | Admitting: Licensed Clinical Social Worker

## 2023-12-26 NOTE — Telephone Encounter (Signed)
 H&V Care Navigation CSW Progress Note  Clinical Social Worker contacted patient by phone to f/u on patient assistance forms, they were returned as undeliverable to pt back to the office. Re-confirmed address for mailing and placed in new envelope if returned again we will have to find another way for pt to get items, has transportation challenges which limit him stopping by to pick up.  Patient is participating in a Managed Medicaid Plan:  No, self pay only  SDOH Screenings   Food Insecurity: Food Insecurity Present (11/09/2023)  Housing: Low Risk (11/09/2023)  Transportation Needs: No Transportation Needs (11/09/2023)  Utilities: Not At Risk (11/09/2023)  Financial Resource Strain: Medium Risk (11/09/2023)  Social Connections: Unknown (05/23/2021)   Received from [facility]  Tobacco Use: High Risk (09/07/2023)  Health Literacy: Adequate Health Literacy (11/09/2023)    Thomas Woods, MSW, LCSW Clinical Social Worker II [facility] Health Heart/Vascular Care Navigation  [phone]- work cell phone (preferred)

## 2024-01-18 ENCOUNTER — Telehealth: Payer: Self-pay | Admitting: Licensed Clinical Social Worker

## 2024-01-18 NOTE — Telephone Encounter (Signed)
 H&V Care Navigation CSW Progress Note  Clinical Social Worker contacted patient by phone to f/u on patient assistance forms. No answer today at [phone]. Left voicemail. Remain available should pt need additional assistance or forms, at this time pt has not f/u with me to complete forms/let me know if new ones are needed.  Patient is participating in a Managed Medicaid Plan:  No, self pay only  SDOH Screenings   Food Insecurity: Food Insecurity Present (11/09/2023)  Housing: Low Risk (11/09/2023)  Transportation Needs: No Transportation Needs (11/09/2023)  Utilities: Not At Risk (11/09/2023)  Financial Resource Strain: Medium Risk (11/09/2023)  Social Connections: Unknown (05/23/2021)   Received from [facility]  Tobacco Use: High Risk (09/07/2023)  Health Literacy: Adequate Health Literacy (11/09/2023)    Marit Lark, MSW, LCSW Clinical Social Worker II [facility] Health Heart/Vascular Care Navigation  [phone]- work cell phone (preferred)

## 2024-01-24 ENCOUNTER — Telehealth: Payer: Self-pay | Admitting: Licensed Clinical Social Worker

## 2024-01-24 NOTE — Telephone Encounter (Signed)
 H&V Care Navigation CSW Progress Note  Clinical Social Worker contacted patient by phone to f/u on patient assistance forms. No answer today at [phone]. Left additional voicemail. Remain available should pt need additional assistance or forms, at this time pt has not f/u with me to complete forms/let me know if new ones are needed.   Patient is participating in a Managed Medicaid Plan:  No, self pay only  SDOH Screenings   Food Insecurity: Food Insecurity Present (11/09/2023)  Housing: Low Risk (11/09/2023)  Transportation Needs: No Transportation Needs (11/09/2023)  Utilities: Not At Risk (11/09/2023)  Financial Resource Strain: Medium Risk (11/09/2023)  Social Connections: Unknown (05/23/2021)   Received from [facility]  Tobacco Use: High Risk (09/07/2023)  Health Literacy: Adequate Health Literacy (11/09/2023)   Marit Lark, MSW, LCSW Clinical Social Worker II [facility] Health Heart/Vascular Care Navigation  [phone]- work cell phone (preferred)

## 2024-01-25 ENCOUNTER — Telehealth: Payer: Self-pay | Admitting: General Practice

## 2024-01-25 ENCOUNTER — Other Ambulatory Visit (HOSPITAL_COMMUNITY): Payer: Self-pay

## 2024-01-25 ENCOUNTER — Other Ambulatory Visit: Payer: Self-pay | Admitting: Nurse Practitioner

## 2024-01-25 NOTE — Telephone Encounter (Signed)
 Called patient to set up a new patient appointment, patient was on the phone with PHARMACY and asked to call back. If patient does call back he needs to be set up with a provider at either one if these clinics  Primary Care at [facility] / [facility] / [facility] , CHW not accepting new patients at the moment.

## 2024-01-29 ENCOUNTER — Other Ambulatory Visit (HOSPITAL_COMMUNITY): Payer: Self-pay

## 2024-01-29 ENCOUNTER — Other Ambulatory Visit: Payer: Self-pay | Admitting: Nurse Practitioner

## 2024-01-29 NOTE — Telephone Encounter (Signed)
 Pt last spoke with Josefa Beauvais NP. Does Josefa Beauvais Np want to refill? Please advise.

## 2024-01-30 ENCOUNTER — Encounter (HOSPITAL_COMMUNITY): Payer: Self-pay

## 2024-01-30 ENCOUNTER — Other Ambulatory Visit (HOSPITAL_COMMUNITY): Payer: Self-pay

## 2024-02-08 NOTE — Progress Notes (Signed)
 " Cardiology Office Note:  .   Date:  02/08/2024  ID:  Thomas Woods, DOB Sep 24, 1965, MRN [id number] PCP: No primary care provider on file.  [facility] Providers Cardiologist:  Georganna Archer, MD { Chief Complaint:  Chief Complaint  Patient presents with   Coronary Artery Disease    History of Present Illness: Thomas Woods is a 59 y.o. 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   Discussed the use of AI scribe software for clinical note transcription with the patient, who gave verbal consent to proceed.  History of Present Illness Thomas Woods is a 59 year old male with coronary artery disease and hypertension who presents for a cardiovascular follow-up.  He has a history of coronary artery disease and hypertension, with a previous myocardial infarction and two stents placed.   He is currently on atorvastatin  for cholesterol management and lisinopril  for blood pressure control. He recently resumed these medications after a lapse due to insurance issues. He reports experiencing joint pain with Zetia  in the past. He is compliant with his medication regimen, taking them as prescribed.  He is actively trying to quit smoking and has restarted varenicline  (Chantix ) to aid in this effort. He reports a reduction in cigarette consumption since resuming the medication, noting that he now smokes about four cigarettes a day, primarily after meals.  No chest pain or shortness of breath. He maintains an active lifestyle, walking to work and throughout the day as part of his job in [employer].  He has faced challenges with insurance coverage, which previously affected his ability to afford medications. He recently obtained insurance that covers his prescriptions, significantly reducing his out-of-pocket costs. He uses a soil scientist  for convenience and affordability.      Studies Reviewed: SABRA    EKG: No new ECG       Cardiac Studies & Procedures   ______________________________________________________________________________________________     ECHOCARDIOGRAM  ECHOCARDIOGRAM COMPLETE 07/07/2014  Narrative *[facility]* [address] [place], [place] [address] [phone]  ------------------------------------------------------------------- Transthoracic Echocardiography  Patient:    Thomas Woods, Thomas Woods MR #:       [id number] Study Date: 07/07/2014 Gender:     M Age:        49 Height:     185.4 cm Weight:     99.8 kg BSA:        2.29 m^2 Pt. Status: Room:  SONOGRAPHER  Carl Plunk, RDCS ATTENDING    Dann Candyce RAMAN ORDERING     [place], Candyce S REFERRING    [place], [place] S PERFORMING   [facility], Outpatient  cc:  ------------------------------------------------------------------- LV EF: 50% -   55%  ------------------------------------------------------------------- Indications:      R06.02 Shortness of Breath.  ------------------------------------------------------------------- History:   PMH:  Acquired from the patient and from the patient&'s chart.  PMH:  NSTEMI. Ischemic Cardiomyopathy. CAD. History of mural thrombus.  ------------------------------------------------------------------- Study Conclusions  - Left ventricle: The cavity size was normal. Wall thickness was normal. Systolic function was at the lower limits of normal. The estimated ejection fraction was in the range of 50% to 55%. Hypokinesis of the apical myocardium. Doppler parameters are consistent with abnormal left ventricular relaxation (grade 1 diastolic dysfunction). No evidence of thrombus. - Aortic valve: There was trivial regurgitation.  Transthoracic echocardiography.  M-mode, complete  2D, spectral Doppler, and color Doppler.  Birthdate:  Patient birthdate: 1965-07-27.  Age:  Patient is  59 yr old.  Sex:  Gender: male. BMI: 29 kg/m^2.  Blood pressure:     115/82  Patient status: Outpatient.  Study date:  Study date: 07/07/2014. Study time: 01:09 PM.  Location:  [facility]  -------------------------------------------------------------------  ------------------------------------------------------------------- Left ventricle:  The cavity size was normal. Wall thickness was normal. Systolic function was at the lower limits of normal. The estimated ejection fraction was in the range of 50% to 55%.  No evidence of thrombus.  Regional wall motion abnormalities: Hypokinesis of the apical myocardium. Doppler parameters are consistent with abnormal left ventricular relaxation (grade 1 diastolic dysfunction).  ------------------------------------------------------------------- Aortic valve:   Structurally normal valve.   Cusp separation was normal.  Doppler:  Transvalvular velocity was within the normal range. There was no stenosis. There was trivial regurgitation.  ------------------------------------------------------------------- Mitral valve:   Structurally normal valve.   Leaflet separation was normal.  Doppler:  Transvalvular velocity was within the normal range. There was no evidence for stenosis. There was trivial regurgitation.  ------------------------------------------------------------------- Left atrium:  The atrium was normal in size.  ------------------------------------------------------------------- Right ventricle:  The cavity size was normal. Wall thickness was normal. Systolic function was normal.  ------------------------------------------------------------------- Pulmonic valve:    Structurally normal valve.   Cusp separation was normal.  Doppler:  Transvalvular velocity was within the normal range. There was trivial regurgitation.  ------------------------------------------------------------------- Tricuspid valve:   Doppler:  There was  trivial regurgitation.  ------------------------------------------------------------------- Right atrium:  The atrium was normal in size.  ------------------------------------------------------------------- Pericardium:  There was no pericardial effusion.  ------------------------------------------------------------------- Systemic veins: Inferior vena cava: The vessel was normal in size. The respirophasic diameter changes were in the normal range (>= 50%), consistent with normal central venous pressure.  ------------------------------------------------------------------- Measurements  Left ventricle                           Value        Reference LV ID, ED, PLAX chordal                  49    mm     43 - 52 LV ID, ES, PLAX chordal                  36    mm     23 - 38 LV fx shortening, PLAX chordal   (L)     27    %      >=29 LV PW thickness, ED                      10    mm     --------- IVS/LV PW ratio, ED                      1            <=1.3 Stroke volume, 2D                        77    ml     --------- Stroke volume/bsa, 2D                    34    ml/m^2 --------- LV e&', lateral  9.57  cm/s   --------- LV E/e&', lateral                         6.12         --------- LV e&', medial                            6.96  cm/s   --------- LV E/e&', medial                          8.42         --------- LV e&', average                           8.27  cm/s   --------- LV E/e&', average                         7.09         ---------  Ventricular septum                       Value        Reference IVS thickness, ED                        10    mm     ---------  LVOT                                     Value        Reference LVOT ID, S                               23    mm     --------- LVOT area                                4.15  cm^2   --------- LVOT mean velocity, S                    71.3  cm/s   --------- LVOT VTI, S                               18.5  cm     ---------  Aorta                                    Value        Reference Aortic root ID, ED                       38    mm     --------- Ascending aorta ID, A-P, mid, ED (H)     40.16 mm     21 - 34  Left atrium                              Value  Reference LA ID, A-P, ES                           35    mm     --------- LA ID/bsa, A-P                           1.53  cm/m^2 <=2.2 LA volume, S                             41.9  ml     --------- LA volume/bsa, S                         18.3  ml/m^2 --------- LA volume, ES, 1-p A4C                   34.5  ml     --------- LA volume/bsa, ES, 1-p A4C               15.1  ml/m^2 --------- LA volume, ES, 1-p A2C                   49.8  ml     --------- LA volume/bsa, ES, 1-p A2C               21.8  ml/m^2 ---------  Mitral valve                             Value        Reference Mitral E-wave peak velocity              58.6  cm/s   --------- Mitral A-wave peak velocity              69.6  cm/s   --------- Mitral deceleration time         (H)     331   ms     150 - 230 Mitral E/A ratio, peak                   0.84         ---------  Right ventricle                          Value        Reference RV s&', lateral, S                        15.7  cm/s   ---------  Legend: (L)  and  (H)  mark values outside specified reference range.  ------------------------------------------------------------------- Prepared and Electronically Authenticated by  Jerel Balding, MD 2016-06-27T17:58:51          ______________________________________________________________________________________________      Results Labs LDL: 116  Risk Assessment/Calculations:            Physical Exam:    VS:  BP (!) 140/82 (BP Location: Right Arm, Patient Position: Sitting, Cuff Size: Normal)   Pulse 86   Ht 6' (1.829 m)   Wt 179 lb 6.4 oz (81.4 kg)   SpO2 98%   BMI 24.33 kg/m      Wt Readings from Last 3 Encounters:  09/07/23 180 lb  (81.6 kg)  05/30/23 178 lb (80.7 kg)  08/22/22  210 lb (95.3 kg)     GEN: Well nourished, well developed, in no acute distress NECK: No JVD; No carotid bruits CARDIAC: RRR, no murmurs, rubs, gallops RESPIRATORY:  Clear to auscultation without rales, wheezing or rhonchi  ABDOMEN: Soft, non-tender, non-distended, normal bowel sounds EXTREMITIES:  Warm and well perfused, no edema; digital clubbing noted 2+ radial pulses PSYCH: Normal mood and affect   ASSESSMENT AND PLAN: .     #CAD c/b NSTEMI s/p PCI to LAD (2016) - No active chest pain and the patient is not a secondary preventative stage. - He is not taking all of his medications as prescribed since he has better insurance coverage albeit medication cost is still somewhat prohibitive for him. Continue aspirin  81 mg daily Follow-up in 3 months  #HFimpEF - No clinical signs of heart failure. - Remains on a beta-blocker and ACE inhibitor for GDMT. Continue carvedilol  12.5 mg twice daily Increasing lisinopril  to 40 mg daily as below  #HTN - His BP is not at goal today. - He is agreeable to increase the dose of his lisinopril  to 40 mg daily. - I counseled the patient on the low but possible risk of angioedema.  This medication is the most cost effective for him so we will continue for now. Increase lisinopril  to 40 mg daily Follow-up in 3 months  #HLD -Last LDL was not at goal but he was not taking his atorvastatin  regularly. -He needs to be on a high intensity statin anyway given his history of MI. -He feels most comfortable increasing the dose to 40 mg to start given issues with prior muscle aches and pains with Setia. Increase atorvastatin  to 40 mg daily Lipid panel, LPA both in 3 months   #Tobacco Use Disorder - Patient has back on Chantix  and is reducing how much he smokes to 1 cigarette a day. - I encouraged ongoing tobacco cessation efforts.  #Thyroid  Nodules - Due for follow-up thyroid  ultrasound in  August.  #Pulmonary Nodules #Digital Clubbing Management per pulmonology who he sees as an outpatient            This note was written with the assistance of a dictation microphone or AI dictation software. Please excuse any typos or grammatical errors.   Signed, Georganna Archer, MD  02/08/2024 11:22 PM    [facility] "

## 2024-02-09 ENCOUNTER — Other Ambulatory Visit (HOSPITAL_COMMUNITY): Payer: Self-pay

## 2024-02-09 ENCOUNTER — Encounter: Payer: Self-pay | Admitting: Student in an Organized Health Care Education/Training Program

## 2024-02-09 ENCOUNTER — Ambulatory Visit
Payer: Self-pay | Attending: Student in an Organized Health Care Education/Training Program | Admitting: Student in an Organized Health Care Education/Training Program

## 2024-02-09 VITALS — BP 140/82 | HR 86 | Ht 72.0 in | Wt 179.4 lb

## 2024-02-09 DIAGNOSIS — E782 Mixed hyperlipidemia: Secondary | ICD-10-CM

## 2024-02-09 DIAGNOSIS — I2510 Atherosclerotic heart disease of native coronary artery without angina pectoris: Secondary | ICD-10-CM

## 2024-02-09 DIAGNOSIS — Z720 Tobacco use: Secondary | ICD-10-CM

## 2024-02-09 DIAGNOSIS — I10 Essential (primary) hypertension: Secondary | ICD-10-CM

## 2024-02-09 MED ORDER — TADALAFIL 10 MG PO TABS
10.0000 mg | ORAL_TABLET | Freq: Every day | ORAL | 0 refills | Status: AC
  Filled 2024-02-09: qty 30, 30d supply, fill #0

## 2024-02-09 MED ORDER — ATORVASTATIN CALCIUM 40 MG PO TABS
40.0000 mg | ORAL_TABLET | Freq: Every day | ORAL | 3 refills | Status: AC
  Filled 2024-02-09: qty 90, 90d supply, fill #0

## 2024-02-09 MED ORDER — LISINOPRIL 40 MG PO TABS
40.0000 mg | ORAL_TABLET | Freq: Every day | ORAL | 3 refills | Status: AC
  Filled 2024-02-09: qty 90, 90d supply, fill #0

## 2024-02-09 NOTE — Patient Instructions (Signed)
 Medication Instructions:  Your physician has recommended you make the following change in your medication:   INCREASE: atorvastatin  (Lipitor ) to 40 mg by mouth once daily INCREASE: lisinopril  to 40 mg by mouth once daily  *If you need a refill on your cardiac medications before your next appointment, please call your pharmacy*  Lab Work: At your next office visit please fast- you will need to have fasting blood work drawn  If you have labs (blood work) drawn today and your tests are completely normal, you will receive your results only by: MyChart Message (if you have MyChart) OR A paper copy in the mail If you have any lab test that is abnormal or we need to change your treatment, we will call you to review the results.  Testing/Procedures: NONE  Follow-Up: At [facility], you and your health needs are our priority.  As part of our continuing mission to provide you with exceptional heart care, our providers are all part of one team.  This team includes your primary Cardiologist (physician) and Advanced Practice Providers or APPs (Physician Assistants and Nurse Practitioners) who all work together to provide you with the care you need, when you need it.  Your next appointment:   3 month(s)  Provider:   Georganna Archer, MD    We recommend signing up for the patient portal called MyChart.  Sign up information is provided on this After Visit Summary.  MyChart is used to connect with patients for Virtual Visits (Telemedicine).  Patients are able to view lab/test results, encounter notes, upcoming appointments, etc.  Non-urgent messages can be sent to your provider as well.   To learn more about what you can do with MyChart, go to [email or web address].   Other Instructions

## 2024-03-08 ENCOUNTER — Ambulatory Visit: Payer: Self-pay | Admitting: Family

## 2024-04-30 ENCOUNTER — Ambulatory Visit: Payer: Self-pay | Admitting: Student in an Organized Health Care Education/Training Program

## 2024-06-07 ENCOUNTER — Other Ambulatory Visit (HOSPITAL_COMMUNITY)
